# Patient Record
Sex: Female | Born: 1945 | Race: Black or African American | Hispanic: No | Smoking: Former smoker
Health system: Southern US, Community
[De-identification: ages and names within clinical notes are randomized; demographics above are authoritative.]

## PROBLEM LIST (undated history)

## (undated) DIAGNOSIS — I739 Peripheral vascular disease, unspecified: Secondary | ICD-10-CM

## (undated) DIAGNOSIS — Z87442 Personal history of urinary calculi: Secondary | ICD-10-CM

## (undated) DIAGNOSIS — I499 Cardiac arrhythmia, unspecified: Secondary | ICD-10-CM

## (undated) DIAGNOSIS — M1990 Unspecified osteoarthritis, unspecified site: Secondary | ICD-10-CM

## (undated) DIAGNOSIS — R0600 Dyspnea, unspecified: Secondary | ICD-10-CM

## (undated) DIAGNOSIS — M545 Low back pain, unspecified: Secondary | ICD-10-CM

## (undated) DIAGNOSIS — E669 Obesity, unspecified: Secondary | ICD-10-CM

## (undated) DIAGNOSIS — E119 Type 2 diabetes mellitus without complications: Secondary | ICD-10-CM

## (undated) DIAGNOSIS — I4892 Unspecified atrial flutter: Secondary | ICD-10-CM

## (undated) DIAGNOSIS — G8929 Other chronic pain: Secondary | ICD-10-CM

## (undated) DIAGNOSIS — R079 Chest pain, unspecified: Secondary | ICD-10-CM

## (undated) DIAGNOSIS — N281 Cyst of kidney, acquired: Secondary | ICD-10-CM

## (undated) DIAGNOSIS — Z8719 Personal history of other diseases of the digestive system: Secondary | ICD-10-CM

## (undated) DIAGNOSIS — I10 Essential (primary) hypertension: Secondary | ICD-10-CM

## (undated) DIAGNOSIS — I4891 Unspecified atrial fibrillation: Secondary | ICD-10-CM

## (undated) DIAGNOSIS — K219 Gastro-esophageal reflux disease without esophagitis: Secondary | ICD-10-CM

## (undated) HISTORY — DX: Obesity, unspecified: E66.9

## (undated) HISTORY — PX: BREAST EXCISIONAL BIOPSY: SUR124

## (undated) HISTORY — DX: Low back pain: M54.5

## (undated) HISTORY — DX: Chest pain, unspecified: R07.9

## (undated) HISTORY — PX: TONSILLECTOMY: SUR1361

## (undated) HISTORY — PX: FOOT SURGERY: SHX648

## (undated) HISTORY — DX: Low back pain, unspecified: M54.50

## (undated) HISTORY — DX: Gastro-esophageal reflux disease without esophagitis: K21.9

## (undated) HISTORY — DX: Other chronic pain: G89.29

---

## 1999-05-25 ENCOUNTER — Other Ambulatory Visit: Admission: RE | Admit: 1999-05-25 | Discharge: 1999-05-25 | Payer: Self-pay | Admitting: Emergency Medicine

## 1999-06-08 ENCOUNTER — Encounter: Payer: Self-pay | Admitting: Emergency Medicine

## 1999-06-08 ENCOUNTER — Encounter: Admission: RE | Admit: 1999-06-08 | Discharge: 1999-06-08 | Payer: Self-pay | Admitting: Emergency Medicine

## 1999-09-01 ENCOUNTER — Encounter: Payer: Self-pay | Admitting: Emergency Medicine

## 1999-09-01 ENCOUNTER — Encounter: Admission: RE | Admit: 1999-09-01 | Discharge: 1999-09-01 | Payer: Self-pay | Admitting: Emergency Medicine

## 1999-09-05 ENCOUNTER — Encounter: Admission: RE | Admit: 1999-09-05 | Discharge: 1999-09-05 | Payer: Self-pay | Admitting: Emergency Medicine

## 1999-09-05 ENCOUNTER — Encounter: Payer: Self-pay | Admitting: Emergency Medicine

## 2000-05-15 ENCOUNTER — Other Ambulatory Visit: Admission: RE | Admit: 2000-05-15 | Discharge: 2000-05-15 | Payer: Self-pay | Admitting: Emergency Medicine

## 2000-05-18 ENCOUNTER — Encounter: Admission: RE | Admit: 2000-05-18 | Discharge: 2000-05-18 | Payer: Self-pay | Admitting: Emergency Medicine

## 2000-05-18 ENCOUNTER — Encounter: Payer: Self-pay | Admitting: Emergency Medicine

## 2000-06-08 ENCOUNTER — Encounter: Payer: Self-pay | Admitting: Emergency Medicine

## 2000-06-08 ENCOUNTER — Encounter: Admission: RE | Admit: 2000-06-08 | Discharge: 2000-06-08 | Payer: Self-pay | Admitting: Emergency Medicine

## 2001-07-31 ENCOUNTER — Encounter: Payer: Self-pay | Admitting: Emergency Medicine

## 2001-07-31 ENCOUNTER — Encounter: Admission: RE | Admit: 2001-07-31 | Discharge: 2001-07-31 | Payer: Self-pay | Admitting: Emergency Medicine

## 2002-09-24 ENCOUNTER — Encounter: Payer: Self-pay | Admitting: Emergency Medicine

## 2002-09-24 ENCOUNTER — Encounter: Admission: RE | Admit: 2002-09-24 | Discharge: 2002-09-24 | Payer: Self-pay | Admitting: Emergency Medicine

## 2003-03-08 ENCOUNTER — Emergency Department (HOSPITAL_COMMUNITY): Admission: EM | Admit: 2003-03-08 | Discharge: 2003-03-08 | Payer: Self-pay | Admitting: Emergency Medicine

## 2003-04-13 ENCOUNTER — Encounter: Admission: RE | Admit: 2003-04-13 | Discharge: 2003-04-13 | Payer: Self-pay | Admitting: Emergency Medicine

## 2003-08-12 ENCOUNTER — Encounter: Admission: RE | Admit: 2003-08-12 | Discharge: 2003-08-12 | Payer: Self-pay | Admitting: Emergency Medicine

## 2003-09-10 ENCOUNTER — Encounter: Admission: RE | Admit: 2003-09-10 | Discharge: 2003-09-10 | Payer: Self-pay | Admitting: Emergency Medicine

## 2003-10-02 ENCOUNTER — Encounter: Admission: RE | Admit: 2003-10-02 | Discharge: 2003-10-02 | Payer: Self-pay | Admitting: Emergency Medicine

## 2003-10-30 ENCOUNTER — Encounter: Admission: RE | Admit: 2003-10-30 | Discharge: 2003-10-30 | Payer: Self-pay | Admitting: Emergency Medicine

## 2004-02-24 ENCOUNTER — Encounter
Admission: RE | Admit: 2004-02-24 | Discharge: 2004-05-24 | Payer: Self-pay | Admitting: Physical Medicine & Rehabilitation

## 2004-02-26 ENCOUNTER — Ambulatory Visit: Payer: Self-pay | Admitting: Physical Medicine & Rehabilitation

## 2004-02-27 ENCOUNTER — Ambulatory Visit (HOSPITAL_COMMUNITY)
Admission: RE | Admit: 2004-02-27 | Discharge: 2004-02-27 | Payer: Self-pay | Admitting: Physical Medicine & Rehabilitation

## 2004-02-27 ENCOUNTER — Encounter: Admission: RE | Admit: 2004-02-27 | Discharge: 2004-02-27 | Payer: Self-pay | Admitting: Emergency Medicine

## 2004-02-29 ENCOUNTER — Ambulatory Visit: Payer: Self-pay | Admitting: Physical Medicine & Rehabilitation

## 2005-10-15 ENCOUNTER — Emergency Department (HOSPITAL_COMMUNITY): Admission: EM | Admit: 2005-10-15 | Discharge: 2005-10-16 | Payer: Self-pay | Admitting: Emergency Medicine

## 2005-10-25 ENCOUNTER — Ambulatory Visit: Payer: Self-pay | Admitting: Nurse Practitioner

## 2005-10-30 ENCOUNTER — Ambulatory Visit (HOSPITAL_COMMUNITY): Admission: RE | Admit: 2005-10-30 | Discharge: 2005-10-30 | Payer: Self-pay | Admitting: Family Medicine

## 2005-12-06 ENCOUNTER — Ambulatory Visit (HOSPITAL_COMMUNITY): Admission: RE | Admit: 2005-12-06 | Discharge: 2005-12-06 | Payer: Self-pay | Admitting: Urology

## 2005-12-13 ENCOUNTER — Ambulatory Visit: Payer: Self-pay | Admitting: Nurse Practitioner

## 2005-12-14 ENCOUNTER — Ambulatory Visit: Payer: Self-pay | Admitting: *Deleted

## 2006-03-26 ENCOUNTER — Ambulatory Visit: Payer: Self-pay | Admitting: Nurse Practitioner

## 2006-04-09 ENCOUNTER — Encounter: Admission: RE | Admit: 2006-04-09 | Discharge: 2006-04-09 | Payer: Self-pay | Admitting: Family Medicine

## 2006-04-17 ENCOUNTER — Ambulatory Visit: Payer: Self-pay | Admitting: Nurse Practitioner

## 2006-04-18 ENCOUNTER — Ambulatory Visit: Payer: Self-pay | Admitting: Nurse Practitioner

## 2006-04-20 ENCOUNTER — Ambulatory Visit (HOSPITAL_COMMUNITY): Admission: RE | Admit: 2006-04-20 | Discharge: 2006-04-20 | Payer: Self-pay | Admitting: Family Medicine

## 2006-06-06 ENCOUNTER — Ambulatory Visit: Payer: Self-pay | Admitting: Nurse Practitioner

## 2006-10-24 ENCOUNTER — Encounter (INDEPENDENT_AMBULATORY_CARE_PROVIDER_SITE_OTHER): Payer: Self-pay | Admitting: *Deleted

## 2006-10-26 ENCOUNTER — Ambulatory Visit: Payer: Self-pay | Admitting: Physical Medicine & Rehabilitation

## 2006-10-26 ENCOUNTER — Encounter
Admission: RE | Admit: 2006-10-26 | Discharge: 2007-01-24 | Payer: Self-pay | Admitting: Physical Medicine & Rehabilitation

## 2006-11-04 ENCOUNTER — Encounter
Admission: RE | Admit: 2006-11-04 | Discharge: 2006-11-04 | Payer: Self-pay | Admitting: Physical Medicine & Rehabilitation

## 2006-11-26 ENCOUNTER — Ambulatory Visit: Payer: Self-pay | Admitting: Physical Medicine & Rehabilitation

## 2006-12-03 ENCOUNTER — Encounter
Admission: RE | Admit: 2006-12-03 | Discharge: 2006-12-19 | Payer: Self-pay | Admitting: Physical Medicine & Rehabilitation

## 2007-01-14 ENCOUNTER — Ambulatory Visit: Payer: Self-pay | Admitting: Physical Medicine & Rehabilitation

## 2007-02-04 ENCOUNTER — Encounter
Admission: RE | Admit: 2007-02-04 | Discharge: 2007-04-18 | Payer: Self-pay | Admitting: Physical Medicine & Rehabilitation

## 2007-02-14 ENCOUNTER — Encounter
Admission: RE | Admit: 2007-02-14 | Discharge: 2007-04-02 | Payer: Self-pay | Admitting: Physical Medicine & Rehabilitation

## 2007-04-01 ENCOUNTER — Ambulatory Visit: Payer: Self-pay | Admitting: Physical Medicine & Rehabilitation

## 2007-04-01 ENCOUNTER — Encounter
Admission: RE | Admit: 2007-04-01 | Discharge: 2007-06-30 | Payer: Self-pay | Admitting: Physical Medicine & Rehabilitation

## 2007-05-28 ENCOUNTER — Ambulatory Visit: Payer: Self-pay | Admitting: Physical Medicine & Rehabilitation

## 2007-05-28 ENCOUNTER — Encounter: Admission: RE | Admit: 2007-05-28 | Discharge: 2007-05-28 | Payer: Self-pay | Admitting: Emergency Medicine

## 2007-06-13 ENCOUNTER — Ambulatory Visit: Payer: Self-pay | Admitting: Physical Medicine & Rehabilitation

## 2008-02-20 DIAGNOSIS — M5137 Other intervertebral disc degeneration, lumbosacral region: Secondary | ICD-10-CM | POA: Insufficient documentation

## 2008-08-14 DIAGNOSIS — J45909 Unspecified asthma, uncomplicated: Secondary | ICD-10-CM | POA: Insufficient documentation

## 2008-08-14 DIAGNOSIS — K219 Gastro-esophageal reflux disease without esophagitis: Secondary | ICD-10-CM | POA: Insufficient documentation

## 2008-09-18 DIAGNOSIS — N390 Urinary tract infection, site not specified: Secondary | ICD-10-CM | POA: Insufficient documentation

## 2008-10-07 DIAGNOSIS — R21 Rash and other nonspecific skin eruption: Secondary | ICD-10-CM | POA: Insufficient documentation

## 2008-10-07 DIAGNOSIS — M129 Arthropathy, unspecified: Secondary | ICD-10-CM | POA: Insufficient documentation

## 2008-11-12 DIAGNOSIS — E559 Vitamin D deficiency, unspecified: Secondary | ICD-10-CM | POA: Insufficient documentation

## 2008-11-12 DIAGNOSIS — D649 Anemia, unspecified: Secondary | ICD-10-CM | POA: Insufficient documentation

## 2008-11-27 ENCOUNTER — Encounter: Admission: RE | Admit: 2008-11-27 | Discharge: 2008-11-27 | Payer: Self-pay | Admitting: Family Medicine

## 2008-12-22 DIAGNOSIS — E785 Hyperlipidemia, unspecified: Secondary | ICD-10-CM | POA: Diagnosis present

## 2008-12-22 DIAGNOSIS — E119 Type 2 diabetes mellitus without complications: Secondary | ICD-10-CM | POA: Diagnosis present

## 2008-12-22 DIAGNOSIS — IMO0001 Reserved for inherently not codable concepts without codable children: Secondary | ICD-10-CM | POA: Diagnosis present

## 2009-03-18 ENCOUNTER — Encounter
Admission: RE | Admit: 2009-03-18 | Discharge: 2009-05-19 | Payer: Self-pay | Admitting: Physical Medicine & Rehabilitation

## 2009-03-19 DIAGNOSIS — R87625 Unsatisfactory cytologic smear of vagina: Secondary | ICD-10-CM | POA: Insufficient documentation

## 2009-03-19 DIAGNOSIS — G4700 Insomnia, unspecified: Secondary | ICD-10-CM | POA: Insufficient documentation

## 2009-03-19 DIAGNOSIS — F339 Major depressive disorder, recurrent, unspecified: Secondary | ICD-10-CM | POA: Insufficient documentation

## 2009-03-23 ENCOUNTER — Ambulatory Visit: Payer: Self-pay | Admitting: Physical Medicine & Rehabilitation

## 2009-03-30 ENCOUNTER — Ambulatory Visit (HOSPITAL_COMMUNITY)
Admission: RE | Admit: 2009-03-30 | Discharge: 2009-03-30 | Payer: Self-pay | Admitting: Physical Medicine & Rehabilitation

## 2009-06-16 ENCOUNTER — Encounter
Admission: RE | Admit: 2009-06-16 | Discharge: 2009-06-16 | Payer: Self-pay | Admitting: Physical Medicine & Rehabilitation

## 2010-02-27 ENCOUNTER — Encounter: Payer: Self-pay | Admitting: Emergency Medicine

## 2010-03-08 NOTE — Miscellaneous (Signed)
Summary: VIP  Patient: Brittany Ford Note: All result statuses are Final unless otherwise noted.  Tests: (1) VIP (Medications)   LLIMPORTMEDS              "Result Below..."       RESULT: PREDNISONE TABS 5 MG*6 TABLETS ON DAY 1, 5 TABLETS ON DAY 2 4 TABS ON DAY 3,  3 TABS ON DAY 4, 2 TABLETS ON DAY 5, 1 TABLET ON DAY 6*06/07/2006*Last Refill: [id number]*******   LLIMPORTMEDS              "Result Below..."       RESULT: NEXIUM CPDR 40 MG*TAKE ONE (1) CAPSULE EACH DAY*06/07/2006*Last  Refill: Unknown*[id number]*******   LLIMPORTMEDS              "Result Below..."       RESULT: MUCINEX DM TB12 30-600 MG*TAKE ONE (1) TABLET TWICE DAILY*06/07/2006*Last Refill: [id number]*[id number]*******   LLIMPORTMEDS              "Result Below..."       RESULT: CYCLOBENZAPRINE HCL TABS 10 MG*TAKE ONE TABLET BY MOUTH EVERY NIGHT AT BEDTIME*12/13/2005*Last Refill: IONGEXB*[id number]*******   LLIMPORTMEDS              "Result Below..."       RESULT: CELEBREX CAPS 200 MG*TAKE ONE (1) CAPSULE EACH DAY*03/26/2006*Last Refill: 06/07/2006*[id number]*******   LLIMPORTMEDS              "Result Below..."       RESULT: AVELOX TABS 400 MG*TAKE ONE (1) TABLET EACH DAY FOR 10 DAYS*06/07/2006*Last Refill: Unknown*[id number]*******   LLIMPORTALLS              NKDA***  Note: An exclamation mark (!) indicates a result that was not dispersed into the flowsheet. Document Creation Date: 12/06/2006 3:07 PM _______________________________________________________________________  (1) Order result status: Final Collection or observation date-time: 10/24/2006 Requested date-time: 10/24/2006 Receipt date-time:  Reported date-time: 10/24/2006 Referring Physician:   Ordering Physician:   Specimen Source:  Source: [facility] [place] Order Number:  Lab site:

## 2010-04-28 ENCOUNTER — Encounter: Payer: Self-pay | Admitting: Internal Medicine

## 2010-05-05 NOTE — Letter (Signed)
Summary: Colonoscopy Date Change Letter  [facility]  [address] [place], [place] [address]   Phone: [phone]  Fax: [phone]      April 28, 2010 MRN: [id number]   Brittany Ford [address] [place], [place]  [address]   Dear [name],   Previously you were recommended to have a repeat colonoscopy around this time. Your chart was recently reviewed by Dr. Lina Sar of [facility]. Follow up colonoscopy is now recommended in March 2015. This revised recommendation is based on current, nationally recognized guidelines for colorectal cancer screening and polyp surveillance. These guidelines are endorsed by the American Cancer Society, [employer] on Colorectal Cancer as well as numerous other major medical organizations.  Please understand that our recommendation assumes that you do not have any new symptoms such as bleeding, a change in bowel habits, anemia, or significant abdominal discomfort. If you do have any concerning GI symptoms or want to discuss the guideline recommendations, please call to arrange an office visit at your earliest convenience. Otherwise we will keep you in our reminder system and contact you 1-2 months prior to the date listed above to schedule your next colonoscopy.  Thank you,  Hedwig Morton. Juanda Chance, M.D.  [facility] [phone]

## 2010-06-21 NOTE — Assessment & Plan Note (Signed)
Brittany Ford states that she had some relief in the neck with the trigger  point injections, but it only lasted for a few days.  She seems to have  worse pain in the left shoulder with tingling in the left arm and hands.  I reviewed her cervical MRI which is unremarkable for any obvious nerve  root impingement.  She rates her pain at 9/10 today.  She felt the  hydrocodone helped somewhat with breakthrough pain, but she has been out  of these.  She was unable to tolerate the Neurontin due to nausea.  She  states the pain is sharp, burning,  constant, tingling, aching.  Pain  interferes general activity, relationship with others, and enjoyment of  life on a moderate to severe level.  Sleep is fair to poor.   REVIEW OF SYSTEMS:  Notable for trouble walking, spasm, tingling.  Other  pertinent positives as listed above, and full review is listed in Health  and History section of the chart.   SOCIAL HISTORY:  Without change.  She is divorced and still smoking.   PHYSICAL EXAMINATION:  VITAL SIGNS: Blood pressure 146/91, pulse 78,  respiratory rate 18.  She is saturating 100% on room air.  GENERAL:  The patient is alert and oriented x3.  Affect is bright and  appropriate.  Gait is slightly antalgic, favoring the right leg.  MUSCULOSKELETAL:  She has limited shoulder use today and had positive  impingement signs.  Bicipital tendons are slightly tender.  She had  trigger points along the middle and upper trapezius on the left.  Left  arm was notable for positive Tinel's sign at the ulnar groove and plus  or minus at the carpal tunnel.  She had intact strength and reflexes at  all upper extremity joints and muscles.  Strength was generally 4+ to  5/5 with some pain inhibition, particularly at the shoulder.  Low back  was limited with movement.  She had a positive straight leg raise on the  right side.  Strength was still inhibited by pain in the right hip and  knee with examination today.  No focal  sensory findings were appreciated  in the right leg or left hand.  HEART:  Regular.  CHEST:  Clear.  ABDOMEN:  Soft, nontender.  BACK:  Low back notable for pain with flexion and somewhat with  extension today.  Facet maneuvers were positive.   ASSESSMENT:  1. 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  2. Left shoulder pain which is more indicative of rotator cuff      syndrome today.  3. Left upper extremity paresthesias and sensory loss.  Need to rule      out cervical radiculopathy versus a peripheral process.  4. Questionable right L4 radiculitis.  I did obtain reports of      injections done at [facility] in 2005.  The first was a right L4 nerve      root injection.  The second was a right L4-L5 intralaminar      injection, and the third was a caudal injection,  none of which      proved to be of any benefit to the patient.  Wanting to think about      other options as opposed to repeating such a course again.  5. Begin Topamax 50 mg nightly for neuropathic pain, increasing to 100      mg after one week's time.  6.  I gave her oxycodone for breakthrough pain control, 1 q. 6 h      p.r.n., #70.  7. I will see her back in about one month's time.      Ranelle Oyster, M.D.  Electronically Signed     ZTS/MedQ  D:  01/15/2007 13:43:05  T:  01/15/2007 17:36:26  Job #:  [id number]   cc:   Reuben Likes, M.D.  Fax: [phone]

## 2010-06-21 NOTE — Assessment & Plan Note (Signed)
Diane is back regarding her pain.  Her low back seems to be giving her  the most pain still at this point with pain most prominent in the back  with some radiation into her legs and knees.  She has had some issues  with her overall health including some high sugar readings and  suggestion of close followup by her family physician.  She is interested  in doing more exercise, but those have been limited due to her back  pain.  She uses her Mobic as well as Topamax.  She is on oxycodone for  breakthrough pain and occasional will double up on this for days when  she is more active.  She describes her pain most problematic when she is  walking or standing.  She also has pain with bending and certainly has  problems with standing after she has been seated for some time.  She  rates her pain at 5-6 out of 10, described as constant and aching.  Pain  interferes with general activity, relations with others, and enjoyment  of life moderate to severe level.   REVIEW OF SYSTEMS:  Notable for the above as well as some depression and  anxiety.  Full reviews are written on the  history section of the chart.   SOCIAL HISTORY:  The patient lives alone and is divorced.  She is  looking at starting some water aerobics classes.   PHYSICAL EXAMINATION:  VITAL SIGNS:  Blood pressure is 120/72, pulse 72,  respiratory rate 20.  She is satting 98% on room air.  GENERAL:  The patient is pleasant, alert, and oriented x3.  She had some  difficulty with standing from a seated position today.  She was able to  flex the lumbar spine to approximately 60-70 degrees before tightness  set in.  She had pain with extension in facet maneuvers to either side.  She was tender in her low back particularly at L4-5 and S1 levels.  Strength was 5/5.  Reflexes 2+.  Sensory exam is normal.  Straight leg  raise test wasequivocal to positive bilaterally.  HEART:  Regular.  CHEST:  Clear.  ABDOMEN:  Soft and nontender.   ASSESSMENT:  1. Multilevel cervical and lumbar facet disease.  2. 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  3. Left shoulder pain consistent with rotator cuff signs in the past.  4. Lumbar radiculopathy perhaps at the L4-L5 levels right greater than      left.   PLAN:  1. As symptoms appear to be more axial than radiating and the fact      that her exam and MR are most consistent with facet signs, I would      favor starting her out with medial branch blocks here and see how      she does.  We have done bilateral SI joint injections in the past      with modest benefit.  I described the procedure to the patient, and      she is in agreement with going forward.  I will refer to Dr.      Wynn Banker for bilateral medial branch blocks at L4-L5 and L5-S1.      Also consider interlaminar injection at L4-L5 level as well      depending on the results.  2. Refill oxycodone #70 today 1 q.6 h. p.r.n.  3. Continue Mobic and Topamax.  4. I will see her back pending the above.  The hope would  be to reduce      her pain such that she could be in a regular aerobic exercise and      stretching program.      Ranelle Oyster, M.D.  Electronically Signed     ZTS/MedQ  D:  05/28/2007 10:30:52  T:  05/28/2007 11:08:31  Job #:  [id number]   cc:   Reuben Likes, M.D.  Fax: [phone]

## 2010-06-21 NOTE — Assessment & Plan Note (Signed)
Brittany Ford is back regarding her pain.  Neck has been doing fairly well over  the last month or so.  Her back continues to be a problem.  She ran out  of her Topamax, and did not realize it had a refill on it, and has gone  without.  She felt that the Topamax was beneficial for her leg symptoms,  particularly on the right side.  She is no longer taking her Mobic  either for similar reason.  She ran out of her oxycodone, and was not in  a rush to refill it apparently.  She rates her pain a 5 out of 10.  She  describes it as sharp, burning, stabbing, constant aching.  The pain  interferes with general activity, relations with others, and enjoyment  of life on a moderate to severe level.  Sleep is poor at times.  I  reviewed her injection history, and she had a right L4/L5 intralaminar  injection, as well as a caudal block and right L4 nerve root injection.  Patient describes her symptoms as starting in her back and radiating  down the lateral aspect of the thigh and leg in to the dorsum of the  foot.  The pain is sometimes tingling and dysesthetic in nature.   REVIEW OF SYSTEMS:  Noted for the above.  She is working on diet.  She  is not getting a lot of physical activity, however, due to her pain  symptoms.  She does complain of reflux.   SOCIAL HISTORY:  Patient is alone and divorced.  No other changes are  noted.   PHYSICAL EXAMINATION:  Blood pressure is 175/78.  Pulse 67.  Respiratory  rate 20.  She is saturating 97% on room air.  Patient is pleasant, alert and oriented x3.  She walks with some  antalgia at the right side.  She has a positive straight leg raise and  seated __________  test on the right side today.  Back is painful along  the lumbar paraspinals, right more than left.  It is L3 through S1.  Strength essentially 5/5 in all 4 extremities with some pain inhibition  in the right hip.  Reflexes are 2+.  Sensory exam is normal.  HEART:  Regular.  CHEST:  Clear.  ABDOMEN:   Soft and nontender.  Patient remains moderately overweight.   ASSESSMENT:  1. Multilevel cervical and lumbar facet disease with degenerative disk      disease.  2. Left shoulder pain with rotator cuff signs.  3. Persistent low back pain and likely L5 radiculitis.  She has been      treated for L4 symptoms in the past without benefit.  The symptoms      are more in line with the L5 process today.   PLAN:  1. We will restart Mobic 15 mg daily.  2. Resume Topamax 100 mg nightly.  She felt relief with this      previously.  3. I refilled oxycodone number 30 today.  4. Consider further injections down the line, including L5 nerve root      block versus a right-sided intralaminar injection in L5/S1.  5. I will see her back in 2 months' time.  Patient would prefer a      conservative course at this point.  Encouraged diet and appropriate      exercise in the meantime as well.      Ranelle Oyster, M.D.  Electronically Signed     ZTS/MedQ  D:  04/02/2007 09:47:24  T:  04/02/2007 11:27:39  Job #:  [id number]   cc:   Reuben Likes, M.D.  Fax: [phone]

## 2010-06-21 NOTE — Assessment & Plan Note (Signed)
REASON FOR VISIT:  [name] is back regarding her neck and back pain.  She complains of more shoulder and arm pain today over the last week to  week and a half.  I received her MRI of the cervical lumbar spine which  showed cervical stenosis at C5-6 unchanged from prior exam.  She had  multiple areas of osteophytes throughout multiple levels.  The lumbar  spine was notable for multiple levels of facet disease, particularly  notable at L4-5 with bulging in conjunction with the facet disease  causing narrowing of the right L4 foramen.  L5 showed similar findings,  but to a lesser extent.  The patient's back pain has been generally  stable.  She still has complaints of the right leg being uncomfortable  and burning in nature.  She did not notice any significant benefit with  the Lidoderm, steroid or Mobic, although she seems to be moving in  general a little bit better today.  She got the generic Vicodin and  does not know if this helps as much.  Sleep is poor.  The patient rates  her pain an 8-9/10 today.   REVIEW OF SYSTEMS:  The patient denies bladder control problems or any  other neurological issues.  Full review is in the written health and  history section of the chart.   SOCIAL HISTORY:  The patient lives alone and still smokes on a limited  basis.   PHYSICAL EXAMINATION:  VITAL SIGNS:  Blood pressure 152/79, pulse 73,  respirations 18, saturations 100% on room air.  GENERAL:  The patient is pleasant, alert and oriented x3.  Affect is  bright and appropriate.  MUSCULOSKELETAL:  The patient did have pain in the left shoulder with  palpation of the short-head biceps tendon today.  She also had taught  bands of muscle along the left Trapezius at middle and superior thirds.  Palpation upon the Trapezius in these areas reproduced pain in her neck.  The back was notable for some pain still in the lumbar spine increased  with straight leg raising as well as excessive bending to  extension.  Facet maneuvers were positive there.  Reflexes were trace in all six  upper extremity locations and the same in the lower extremity testing  spots.  Strength appeared to be equal at 4+ to 5/5 in all four  extremities with some pain inhibition at times.  Cognitively, the  patient was appropriate.  HEART:  Regular.  CHEST:  Clear.  ABDOMEN:  Soft, nontender.  NEUROLOGIC:  Mood was pleasant.   ASSESSMENT:  1. Multi-level cervical and lumbar facet disease and degenerative disc      disease.  2. Left shoulder pain most likely due to short-head biceps tendinitis.  3. Likely shoulder girdle myofascial pain on the left.  4. Questionable right L4 radiculitis related to #1.   PLAN:  1. After informed consent, we injected two trigger points in the      Trapezius using 2 mL each of 1% lidocaine.  I also injected the      left short-head biceps tendon with 40 mg of Kenalog and 3 mL of 1%      lidocaine.  2. Will send the patient for physical therapy to work on lumbar and      cervical positioning, stretching range of motion, strengthening,      etc.  Modalities will be used as indicated.  She may do well with      traction in the  cervical spine.  She needs to be on a regular      exercise program as well.  3. Consider right L4 nerve root injection in the future.  I think she      may have had this in the past, so we will hold off on it at the      moment.  4. Begin Neurontin low-dose 100 mg nightly and then increasing to      b.i.d. over 1 week's time to reduce radicular      symptoms.  5. I will see her back in about 1 month for followup as we move      forward.      Ranelle Oyster, M.D.  Electronically Signed     ZTS/MedQ  D:  11/26/2006 13:25:30  T:  11/27/2006 09:43:46  Job #:  [id number]   cc:   Reuben Likes, M.D.  Fax: [phone]

## 2010-06-21 NOTE — Procedures (Signed)
NAME:  Brittany Ford, Brittany Ford               ACCOUNT NO.:  [id number]   MEDICAL RECORD NO.:  [id number]          PATIENT TYPE:  REC   LOCATION:  [facility]                          FACILITY:  [facility]   PHYSICIAN:  Erick Colace, M.D.DATE OF BIRTH:  11-Jan-1946   DATE OF PROCEDURE:  06/13/2007  DATE OF DISCHARGE:                               OPERATIVE REPORT   PROCEDURE:  Bilateral L5 dorsal ramus injection, bilateral L4 medial  branch block, bilateral L3 medial branch block under fluoroscopic  guidance at both sacral and lumbar levels.   INDICATIONS:  Lumbar spondylosis without myelopathy.  Pain is only  partial responsive to medication management including narcotic  analgesics and interferes with self-care mobility.   Informed consent was obtained after describing risks and benefits of the  procedure to the patient.  These include bleeding, bruising, infection,  temporary or permanent paralysis.  She elects to proceed and has given  written consent.  The patient placed prone on fluoroscopy table.  Betadine prep, sterile drape, 25-gauge inch and a half needle was used  to anesthetize the skin and subcutaneous tissue 1% lidocaine x2 mL, then  a 22-gauge three and a half inch spinal needle was inserted under  fluoroscopic guidance first targeting the left S1 SAP sacral ala  junction.  Bone contact made, confirmed with lateral imaging.  Omnipaque  180 x 0.5 mL demonstrated no intravascular uptake and 0.5 mL of a  solution containing 1 mL of 4 mg/mL  dexamethasone and 2 mL of 2% MPF  lidocaine were injected after negative drawback for blood.  Then the  left L5 SAP transverse process junction targeted.  Bone contact made,  confirmed with lateral imaging.  Omnipaque 180 x0.5 mL demonstrated no  intravascular uptake, then 0.5 mL of dexamethasone lidocaine solution  was injected.  Then the left L4 SAP transverse junction targeted.  Bone  contact made, confirmed with lateral imaging.  Omnipaque 180  x0.5  demonstrated no intravascular uptake, then 0.5 mL of dexamethasone  lidocaine solution was injected.  The patient tolerated procedure well.  Post injection vitals stable.  Post injection instructions given.  Pre-  injection pain level 5/10, post injection 1/10.  I will see her back in  one month for possible reinjection at that time.      Erick Colace, M.D.  Electronically Signed     AEK/MEDQ  D:  06/13/2007 15:02:03  T:  06/13/2007 15:23:33  Job:  [id number]

## 2010-06-24 NOTE — Group Therapy Note (Signed)
MEDICAL RECORD NUMBER:  [id number].   REFERRING PHYSICIAN:  Dr. Leslee Home.   CHIEF COMPLAINT:  Neck and predominantly low back pain.   The patient's goals are to become pain free.   HISTORY OF PRESENT ILLNESS:  This is a 65 year old African-American female  who reports low back pain since around the year 2000. The pain has been  treated off and on with therapy with different modalities. She has been on  other types of pain medications, anti-inflammatories, and muscle relaxants  over the years. She feels that pain seems to increase over the last year's  time. The patient has affected her job performance as well as mood. She has  become depressed. The patient had a MRI performed of the lumbar spine in  July 2005 which was compared to a study done in 2001. The MRI in 2005  revealed a mild concentric disk bulge at L2, slightly progressed from the  prior study. There was also concentric disk bulge at L3-L4 without central  canal stenosis with mild biforaminal narrowing predominantly due to disk  osteophytes and concentric disk bulge which has progressed mildly from the  prior study. At L4-L5, there was a moderate concentric disk bulge presenting  in mild central stenosis with biforaminal narrowing, right greater than  left, which had progressed also. There may have been some right L4-L5 nerve  root impingement also. Moderate facet arthropathy at L4-L5 had not changed.  The patient ultimately as a result of the testing and her symptoms underwent  a series of injection. The first two essentially were transforaminal  injections from the right side at L4-L5 using Depo-Medrol and lidocaine. The  patient felt no relief with these. These were done in August of 2005. In  September of 2005, she had a caudal epidural done with 120 mg of Decadrol  and 3 cc of lidocaine without any results either. Most recently, the patient  was placed on Aleve without any significant benefit as of yet. She is  taking  4 to 5 a day. She is also using Flexeril for spasms p.r.n. She was given a  new prescription for hydrocodone as well. She has used this before for  breakthrough pain also.   The patient reports her pain is an 8/10 on average and ranges from to 7 to  10. It usually worsens with walking, bending, and working and improves  somewhat with therapies and medications. Additionally, the patient reports  numbness in the feet which is relatively new. She also notes periodic  numbness in the hand. The right knee is painful, and she thinks she may have  arthritis in the hand. The low back pain itself seems to stay predominately  in the low back and over the buttock areas. She does have some referral down  the anterolateral legs, but this is inconsistent and is thought that she is  not sure on the right side that this is not in fact her knee pain.   CURRENT MEDICATIONS:  1.  Effexor 225 mg XR q.d.  2.  Flexeril 5 mg q.d. p.r.n.  3.  Aleve 4 to 5 tablets a day.  4.  Midrin p.r.n. q.d. for migraine headaches.   ALLERGIES:  No known drug allergies.   PAST MEDICAL HISTORY:  1.  The patient denies history other than depression and her back pain      problems.  2.  Migraine headaches.   PAST SURGICAL HISTORY:  She has had a left foot surgery in 2004.  SOCIAL HISTORY:  The patient lives alone. Smokes a half a pack per day. She  is divorced. 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 She  does a lot of work on [employer] and computers. She is also up and down a  lot through the day from her desk. She has been in this line of work for  about 7 years. She has been out of work for about a week due to pain. She  works for [employer].   FAMILY HISTORY:  Significant for cancer.   REVIEW OF SYSTEMS:  The patient reports occasional chest pain, swelling,  cold or flu symptoms, depression, problems with sleep, headaches, spasms,  reflux, decreased appetite, bowel incontinence, and  weight loss. Other  review of systems are in the health and history section of our chart.   PHYSICAL EXAMINATION:  GENERAL:  Blood pressure is 145/79, pulse 73,  respiratory rate 16. She is saturating 98% on room air. The patient walks  with a shuffling gait. Affect is bright and alert. Appearance is well kept.  HEENT:  On examination of the head and neck, she is normocephalic,  atraumatic. Pupils are equal, round, and reactive to light.  HEART:  Regular rate and rhythm.  LUNGS:  Clear.  ABDOMEN:  Soft and nontender.  EXTREMITIES:  Showed no clubbing or cyanosis. She did have trace edema in  both ankles of pitting variety. Neck range of motion was a bit limited  today, particularly with forward flexion. We did not address the neck in  depth today due to her multiple other issues. Low back was limited with  movement. She is able to move about 30 degrees with forward flexion, about  10 degrees with extension, both of which elicited pain. Rotation and lateral  bending to each side were about 15 degrees, all with pain. She had  tenderness along both PSIS areas and generalized discomfort with palpation  along the lower lumbar region into the upper gluteal areas bilaterally.  Piriformis testing was negative to equivocal. Greater trochanters were  nontender. Pelvis was symmetrical, and lumbar spine and thoracic spine were  fairly symmetrical today. No significant rotation was noted in the AP plane.  On provocative maneuvers, Patrick's test was positive bilaterally, right  more so than left. Straight leg raise testing was equivocal to positive.  Facet maneuvers were equivocal. Motors testing was 5/5 really in both upper  and lower extremities with a small amount of pain inhibition in the proximal  bilateral lower extremities. He seemed to have decreased pinprick in a  stocking-glove type distribution in both legs. Right hand was negative for sensory loss. She had equivocal Phalen's test today.  Cognition was  appropriate. Cranial nerve exam was intact.   ASSESSMENT:  1.  Chronic low back pain which is likely multifactorial. I would question      significant SI joint component based on her exam history. Cannot rule      out a facet cause also. I do not feel that radiculopathy or stenosis is      a major factor to her pain at this point.  2.  Bilateral distal sensory loss. Noted a lab from last year with elevated      glucose. I would be concerned here for diabetes. I would like to perform      a screening hemoglobin A1c test today to see that if her sugars have in      fact been elevated over time.  May consider nerve conduction studies      along this line as well to also include the right upper extremity. The      right upper extremity could in fact be a mild carpal tunnel syndrome.  3.  Await results of her cervical MRI.  4.  Give the patient samples of Lidoderm patches, and she will have the      prescription patches, too, as well once her samples run out. She is to      apply 1 to 2 patches per day to the low back region.  5.  Advised her to take Aleve maximum 2 tablets twice a day rather than 4 to      5 times a day. The patient understands.  6.  For breakthrough pain, will use oxycodone 5 mg 1 to 2 q.6h. p.r.n.  7.  Send the patient for bilateral SI joint injections with Dr. Wynn Banker.      Consider facet injections.  8.  Refill the patient's Flexeril today 5 mg t.i.d. p.r.n.  9.  Will see the patient back after her SI joint injections.     Zach   ZTS/MedQ  D:  02/26/2004 13:16:02  T:  02/26/2004 14:56:02  Job #:  [id number]   cc:   Reuben Likes, M.D.  [address]  [place]  [place] [address]  Fax: [phone]

## 2010-08-13 ENCOUNTER — Emergency Department (HOSPITAL_COMMUNITY): Payer: Medicare Other

## 2010-08-13 ENCOUNTER — Inpatient Hospital Stay (HOSPITAL_COMMUNITY)
Admission: EM | Admit: 2010-08-13 | Discharge: 2010-08-15 | DRG: 392 | Disposition: A | Discharge status: Home / Self Care | Payer: Medicare Other | Attending: Family Medicine | Admitting: Family Medicine

## 2010-08-13 DIAGNOSIS — Z7982 Long term (current) use of aspirin: Secondary | ICD-10-CM

## 2010-08-13 DIAGNOSIS — Z87891 Personal history of nicotine dependence: Secondary | ICD-10-CM

## 2010-08-13 DIAGNOSIS — M1990 Unspecified osteoarthritis, unspecified site: Secondary | ICD-10-CM | POA: Diagnosis present

## 2010-08-13 DIAGNOSIS — E860 Dehydration: Secondary | ICD-10-CM | POA: Diagnosis present

## 2010-08-13 DIAGNOSIS — Z79899 Other long term (current) drug therapy: Secondary | ICD-10-CM

## 2010-08-13 DIAGNOSIS — K529 Noninfective gastroenteritis and colitis, unspecified: Secondary | ICD-10-CM

## 2010-08-13 DIAGNOSIS — A088 Other specified intestinal infections: Principal | ICD-10-CM | POA: Diagnosis present

## 2010-08-13 DIAGNOSIS — R55 Syncope and collapse: Secondary | ICD-10-CM

## 2010-08-13 DIAGNOSIS — I10 Essential (primary) hypertension: Secondary | ICD-10-CM | POA: Diagnosis present

## 2010-08-13 DIAGNOSIS — J40 Bronchitis, not specified as acute or chronic: Secondary | ICD-10-CM | POA: Diagnosis present

## 2010-08-13 DIAGNOSIS — J329 Chronic sinusitis, unspecified: Secondary | ICD-10-CM | POA: Diagnosis present

## 2010-08-13 DIAGNOSIS — Z23 Encounter for immunization: Secondary | ICD-10-CM

## 2010-08-13 LAB — COMPREHENSIVE METABOLIC PANEL
ALT: 15 U/L (ref 0–35)
AST: 16 U/L (ref 0–37)
Albumin: 3.9 g/dL (ref 3.5–5.2)
Alkaline Phosphatase: 111 U/L (ref 39–117)
BUN: 10 mg/dL (ref 6–23)
CO2: 24 mEq/L (ref 19–32)
Calcium: 9.6 mg/dL (ref 8.4–10.5)
Chloride: 104 mEq/L (ref 96–112)
Creatinine, Ser: 0.82 mg/dL (ref 0.50–1.10)
GFR calc Af Amer: >60 mL/min (ref >60)
GFR calc non Af Amer: >60 mL/min (ref >60)
Glucose, Bld: 178 mg/dL — ABNORMAL HIGH (ref 70–99)
Potassium: 4.5 mEq/L (ref 3.5–5.1)
Sodium: 138 mEq/L (ref 135–145)
Total Bilirubin: 0.3 mg/dL (ref 0.3–1.2)
Total Protein: 8.1 g/dL (ref 6.0–8.3)

## 2010-08-13 LAB — CBC
HCT: 39.1 % (ref 36.0–46.0)
Hemoglobin: 13.3 g/dL (ref 12.0–15.0)
MCH: 27.9 pg (ref 26.0–34.0)
MCHC: 34 g/dL (ref 30.0–36.0)
MCV: 82.1 fL (ref 78.0–100.0)
Platelets: 302 10*3/uL (ref 150–400)
RBC: 4.76 MIL/uL (ref 3.87–5.11)
RDW: 14.5 % (ref 11.5–15.5)
WBC: 18.3 10*3/uL — ABNORMAL HIGH (ref 4.0–10.5)

## 2010-08-13 LAB — URINALYSIS, ROUTINE W REFLEX MICROSCOPIC
Glucose, UA: NEGATIVE mg/dL
Ketones, ur: 15 mg/dL — AB
Leukocytes, UA: NEGATIVE
Nitrite: NEGATIVE
Protein, ur: 100 mg/dL — AB
Specific Gravity, Urine: 1.026 (ref 1.005–1.030)
Urobilinogen, UA: 0.2 mg/dL (ref 0.0–1.0)
pH: 5.5 (ref 5.0–8.0)

## 2010-08-13 LAB — DIFFERENTIAL
Basophils Absolute: 0 10*3/uL (ref 0.0–0.1)
Basophils Relative: 0 % (ref 0–1)
Eosinophils Absolute: 0.1 10*3/uL (ref 0.0–0.7)
Eosinophils Relative: 1 % (ref 0–5)
Lymphocytes Relative: 4 % — ABNORMAL LOW (ref 12–46)
Lymphs Abs: 0.7 10*3/uL (ref 0.7–4.0)
Monocytes Absolute: 0.6 10*3/uL (ref 0.1–1.0)
Monocytes Relative: 4 % (ref 3–12)
Neutro Abs: 16.9 10*3/uL — ABNORMAL HIGH (ref 1.7–7.7)
Neutrophils Relative %: 92 % — ABNORMAL HIGH (ref 43–77)

## 2010-08-13 LAB — URINE MICROSCOPIC-ADD ON

## 2010-08-13 LAB — CARDIAC PANEL(CRET KIN+CKTOT+MB+TROPI)
CK, MB: 1.2 ng/mL (ref 0.3–4.0)
Relative Index: INVALID (ref 0.0–2.5)
Total CK: 59 U/L (ref 7–177)
Troponin I: <0.3 ng/mL (ref <0.30)

## 2010-08-13 LAB — TROPONIN I: Troponin I: <0.3 ng/mL (ref <0.30)

## 2010-08-13 NOTE — H&P (Signed)
NAME:  Brittany Ford, Brittany Ford NO.:  [id number]  MEDICAL RECORD NO.:  [id number]  LOCATION:  [facility]                         FACILITY:  [facility]  PHYSICIAN:  Zannie Cove, MD     DATE OF BIRTH:  Sep 30, 1945  DATE OF ADMISSION:  08/13/2010 DATE OF DISCHARGE:                             HISTORY & PHYSICAL   PRIMARY CARE PHYSICIAN:  Dr. [name] at [facility].  CHIEF COMPLAINT:  Nausea, vomiting, diarrhea, and passing out.  HISTORY OF PRESENT ILLNESS:  Brittany Ford is a 65 year old African American female with history of degenerative joint disease, remote history of diverticulitis started having symptoms of nausea, vomiting, and diarrhea starting today.  She reports about 4 to 5 episodes of loose stools without any blood in it today associated with clear nonbilious vomiting.  Subsequently, decided to go to the [facility] walk-in clinic. Over there, she had 2 more episodes of diarrhea and 2 episodes of vomiting and during one of these vomiting bouts, she bent over and the next thing she knew she was lying on the bathroom floor.  Probably passed out transiently for a few seconds.  Her family was outside and they heard a thumping, opened the door, and found her lying on the bathroom floor. The patient was immediately awake after this.  Denies any chest pain, palpitations, shortness of breath.  She denies fevers or chills.  She denies being on any recent antibiotics.  She denies eating outside.  She denies recent travel.  She denies any sick contacts.  MEDICATIONS:  None.  PAST MEDICAL HISTORY: 1. Significant for history of degenerative joint disease. 2. History of palpitations off and on for 1 year. 3. History of stress test reportedly a year ago normal. 4. History of tobacco use.  ALLERGIES:  No known drug allergies.  SOCIAL HISTORY:  Lives alone at home, has two adult grown children who check on her.  Denies any history of smoking.  Was a former  alcoholic, quit many years ago.  She is independent in all ADLs.  FAMILY HISTORY:  Significant for heart disease in both parents.  Mother died of a heart attack.  REVIEW OF SYSTEMS:  Positive for sinusitis congestion and sinus headache.  PHYSICAL EXAMINATION:  VITAL SIGNS:  Temperature is 97.7, pulses is 83, blood pressure 149/62.  Orthostatics pending.  Respirations 18, satting 99% on 2 L. GENERAL:  This is a middle-aged [place] American female lying in the stretcher in no acute distress.  General exam, she is alert, awake, oriented x3. HEENT:  Pupils equal reactive to light.  Oral mucosa is dry. NECK:  No JVD or lymphadenopathy. CARDIOVASCULAR:  S1, S2.  Regular rate and rhythm. LUNGS:  Mildly decreased breath sounds at the bases, otherwise clear. ABDOMEN:  Soft, minimally distended with no rigidity or tenderness.  No rebound.  No organomegaly.  No flank tenderness. EXTREMITIES:  Trace pitting edema in both lower extremities. NEURO:  Without any focal localizing signs.  LABORATORY DATA:  Shows white count of 18.3, hemoglobin 13.  Intact CO2 as a left shift, neutrophils are 92%.  B-mat is normal except for glucose of 178.  LFTs normal.  Troponin I less than 0.3.  Urine is cloudy with negative nitrite, leukocytes trace.  WBC count is not reported.  Chest x-ray shows no acute findings.  CT of the head also shows degenerative spondylosis, no acute findings.  CT C-spine shows degenerative spondylosis, no acute findings and CT head shows is a negative noncontrast head CT.  EKG sinus rhythm, poor R-wave progression otherwise no other acute ST-T wave changes.  No prior EKG for comparison.  ASSESSMENT:  Brittany Ford is a 65 year old female with: 1. Gastroenteritis. 2. Syncope likely vasovagal versus secondary to dehydration. 3. History of palpitations. 4. History of normal stress test 1 year ago. 5. Possible urinary tract infection.  PLAN:  We will admit her to telemetry floor  today.  Due to palpitations, we will check cardiac enzymes and a 2-D echo.  Also empirically start on IV Cipro and Flagyl.  Check C diff PCR and urine culture.  Further management as condition evolves.     Zannie Cove, MD     PJ/MEDQ  D:  08/13/2010  T:  08/13/2010  Job:  [id number]  cc:   [name], M.D.  Electronically Signed by Zannie Cove  on 08/13/2010 09:12:10 PM

## 2010-08-14 LAB — CARDIAC PANEL(CRET KIN+CKTOT+MB+TROPI)
CK, MB: 1.1 ng/mL (ref 0.3–4.0)
Relative Index: INVALID (ref 0.0–2.5)
Total CK: 57 U/L (ref 7–177)
Troponin I: <0.3 ng/mL (ref <0.30)

## 2010-08-15 LAB — COMPREHENSIVE METABOLIC PANEL
ALT: 9 U/L (ref 0–35)
AST: 13 U/L (ref 0–37)
Albumin: 3 g/dL — ABNORMAL LOW (ref 3.5–5.2)
Alkaline Phosphatase: 82 U/L (ref 39–117)
BUN: 12 mg/dL (ref 6–23)
CO2: 21 mEq/L (ref 19–32)
Calcium: 8.4 mg/dL (ref 8.4–10.5)
Chloride: 108 mEq/L (ref 96–112)
Creatinine, Ser: 0.74 mg/dL (ref 0.50–1.10)
GFR calc Af Amer: >60 mL/min (ref >60)
GFR calc non Af Amer: >60 mL/min (ref >60)
Glucose, Bld: 108 mg/dL — ABNORMAL HIGH (ref 70–99)
Potassium: 3.4 mEq/L — ABNORMAL LOW (ref 3.5–5.1)
Sodium: 140 mEq/L (ref 135–145)
Total Bilirubin: 0.2 mg/dL — ABNORMAL LOW (ref 0.3–1.2)
Total Protein: 6.3 g/dL (ref 6.0–8.3)

## 2010-08-15 LAB — CBC
HCT: 32.6 % — ABNORMAL LOW (ref 36.0–46.0)
Hemoglobin: 10.5 g/dL — ABNORMAL LOW (ref 12.0–15.0)
MCH: 26.5 pg (ref 26.0–34.0)
MCHC: 32.2 g/dL (ref 30.0–36.0)
MCV: 82.3 fL (ref 78.0–100.0)
Platelets: 243 10*3/uL (ref 150–400)
RBC: 3.96 MIL/uL (ref 3.87–5.11)
RDW: 14.7 % (ref 11.5–15.5)
WBC: 11 10*3/uL — ABNORMAL HIGH (ref 4.0–10.5)

## 2010-08-15 LAB — URINE CULTURE
Colony Count: 80000
Culture  Setup Time: 201207080227

## 2010-08-16 NOTE — Discharge Summary (Signed)
NAME:  Brittany Ford, Brittany Ford NO.:  [id number]  MEDICAL RECORD NO.:  [id number]  LOCATION:  3711                         FACILITY:  [facility]  PHYSICIAN:  Standley Dakins, MD   DATE OF BIRTH:  1945-08-25  DATE OF ADMISSION:  08/13/2010 DATE OF DISCHARGE:  08/15/2010                              DISCHARGE SUMMARY   PRIMARY CARE PHYSICIAN:  [name], M.D. at [facility]  DISCHARGE DIAGNOSES: 1. Gastroenteritis - resolved. 2. Sinusitis and bronchitis. 3. Hypertension. 4. History of diverticulitis. 5. History of palpitations. 6. History of tobacco use. 7. Syncopal episode thought to be vasovagal secondary to dehydration.  DISCHARGE MEDICATIONS: 1. Guaifenesin 600 mg tabs long-acting one p.o. b.i.d. 2. Lisinopril 10 mg one p.o. daily. 3. Avelox 400 mg one p.o. daily for 5 days. 4. Tramadol 50 mg one p.o. q.6 h. p.r.n. pain. 5. Advil 200 mg one p.o. p.r.n. pain. 6. Aspirin 81 mg one p.o. daily. 7. Flaxseed oil 1 tablet by mouth daily. 8. Niacin 500 mg one p.o. by mouth daily. 9. Pseudoephedrine p.r.n. 10.Albuterol HFA 2 puffs q.3-4 h p.r.n. shortness of breath and     wheezing.  HOSPITAL COURSE:  Briefly, this patient is a 65 year old female who was admitted to the hospital with nausea, vomiting, diarrhea, and an episode of passing out.  She had been dehydrated.  She had 4-5 bouts of diarrhea.  The diarrhea was non bloody and associated with clear nonbilious vomiting.  She went into the walk-in clinic at [facility] and had two more episodes of diarrhea.  She was sent into the emergency department, treated with IV fluids and hospital admission was requested. The patient was admitted and started on Avelox.  She tolerated it very well.  Her symptoms improved considerably.  She had no further diarrhea and no diarrhea in the hospital.  The patient had no palpitations.  The telemetry monitoring revealed no significant abnormalities.  The patient also had an  echocardiogram that revealed moderate concentric hypertrophy of the left ventricle, systolic function was vigorous with an ejection fraction estimated at 65% to 70% with normal wall motion.  The patient was ruled out for myocardial injury with serial cardiac enzymes.  The patient had a urinalysis that initially revealed ketones, trace blood, but the microscopic evaluation and urine culture did not reveal a significant urinary tract infection.  The patient began to have symptoms of sinusitis and continued Avelox treatment.  She also had some bronchitis symptoms and had nebulizer treatment.  She is going to be discharged with an albuterol HFA to use p.r.n.  DISCHARGE CONDITION:  Stable, at baseline.  DISPOSITION:  The patient will be discharged home with family members.  ACTIVITY:  Ad lib.  FOLLOWUP:  Followup with Dr. Lenise Arena in 1 week, have her blood pressure rechecked.  SPECIAL INSTRUCTIONS: 1. We did start you on a blood pressure medication.  I would like to     have your blood pressure retested with her primary care physician     next week. 2. Return if symptoms recur, worsen, or new changes develop. 3. Discuss wearing a Holter monitor with your primary care physician     to evaluate for palpitations.  08/15/2010 DISCHARGE PHYSICAL EXAMINATION:  GENERAL:  The patient was evaluated on the day of discharge.  The patient was slightly agitated, but cooperative and pleasant in no distress. HEENT:  Normocephalic, atraumatic.  Mucous membranes moist.  The patient is asymptomatic. NECK:  Supple.  Thyroid is soft.  No nodules or masses palpated. LUNGS:  Bilateral breath sounds, clear to auscultation.  No crackles, wheezes, or rhonchi.  The patient does have expiratory wheezes heard on exam. ABDOMEN:  Soft, nondistended, nontender.  No masses palpated. EXTREMITIES:  No cyanosis or clubbing. NEUROLOGIC:  No focal deficits. SKIN:  No gross lesions noted.  LABORATORY DATA:  White  blood cell count 11.0, hemoglobin 10.5, hematocrit 32.6, platelet count 243.  Sodium 140, potassium 3.4, chloride 108, CO2 of 21, BUN 12, creatinine 0.74, AST 13, ALT 9, calcium 8.4, albumin 3.0.  IMPRESSION:  The patient is evaluated and determined to be stable for discharge with close followup with her primary care physician.  I spent 37 minutes preparing this patient's discharge including reviewing her medical records, counseling with her about the results of tests that we did in the hospital, reconciling medications.     Standley Dakins, MD     CJ/MEDQ  D:  08/15/2010  T:  08/15/2010  Job:  [id number]  cc:   [name], M.D.  Electronically Signed by Standley Dakins  on 08/16/2010 07:06:25 PM

## 2011-07-21 ENCOUNTER — Encounter: Payer: Self-pay | Admitting: Cardiology

## 2011-12-06 ENCOUNTER — Other Ambulatory Visit (HOSPITAL_COMMUNITY)
Admission: RE | Admit: 2011-12-06 | Discharge: 2011-12-06 | Disposition: A | Discharge status: Home / Self Care | Payer: Medicare Other | Source: Ambulatory Visit | Attending: Family Medicine | Admitting: Family Medicine

## 2011-12-06 ENCOUNTER — Other Ambulatory Visit: Payer: Self-pay | Admitting: Family Medicine

## 2011-12-06 DIAGNOSIS — Z124 Encounter for screening for malignant neoplasm of cervix: Secondary | ICD-10-CM | POA: Insufficient documentation

## 2011-12-07 ENCOUNTER — Other Ambulatory Visit: Payer: Self-pay | Admitting: Family Medicine

## 2011-12-07 DIAGNOSIS — Z1231 Encounter for screening mammogram for malignant neoplasm of breast: Secondary | ICD-10-CM

## 2012-01-02 ENCOUNTER — Other Ambulatory Visit: Payer: Self-pay | Admitting: Family Medicine

## 2012-01-02 DIAGNOSIS — M79606 Pain in leg, unspecified: Secondary | ICD-10-CM

## 2012-01-14 ENCOUNTER — Observation Stay (HOSPITAL_COMMUNITY)
Admission: EM | Admit: 2012-01-14 | Discharge: 2012-01-15 | Disposition: A | Discharge status: Home / Self Care | Payer: Medicare Other | Attending: Emergency Medicine | Admitting: Emergency Medicine

## 2012-01-14 ENCOUNTER — Encounter (HOSPITAL_COMMUNITY): Payer: Self-pay | Admitting: *Deleted

## 2012-01-14 ENCOUNTER — Emergency Department (HOSPITAL_COMMUNITY): Payer: Medicare Other

## 2012-01-14 DIAGNOSIS — K219 Gastro-esophageal reflux disease without esophagitis: Secondary | ICD-10-CM | POA: Insufficient documentation

## 2012-01-14 DIAGNOSIS — Z7982 Long term (current) use of aspirin: Secondary | ICD-10-CM | POA: Insufficient documentation

## 2012-01-14 DIAGNOSIS — R002 Palpitations: Secondary | ICD-10-CM | POA: Insufficient documentation

## 2012-01-14 DIAGNOSIS — Z87891 Personal history of nicotine dependence: Secondary | ICD-10-CM | POA: Insufficient documentation

## 2012-01-14 DIAGNOSIS — E785 Hyperlipidemia, unspecified: Secondary | ICD-10-CM | POA: Insufficient documentation

## 2012-01-14 DIAGNOSIS — R079 Chest pain, unspecified: Principal | ICD-10-CM | POA: Insufficient documentation

## 2012-01-14 DIAGNOSIS — E669 Obesity, unspecified: Secondary | ICD-10-CM | POA: Insufficient documentation

## 2012-01-14 DIAGNOSIS — E119 Type 2 diabetes mellitus without complications: Secondary | ICD-10-CM | POA: Insufficient documentation

## 2012-01-14 DIAGNOSIS — IMO0001 Reserved for inherently not codable concepts without codable children: Secondary | ICD-10-CM | POA: Diagnosis present

## 2012-01-14 LAB — TROPONIN I
Troponin I: <0.3 ng/mL (ref <0.30)
Troponin I: <0.3 ng/mL (ref <0.30)
Troponin I: <0.3 ng/mL (ref <0.30)

## 2012-01-14 LAB — POCT I-STAT, CHEM 8
BUN: 12 mg/dL (ref 6–23)
Calcium, Ion: 1.15 mmol/L (ref 1.13–1.30)
Chloride: 109 mEq/L (ref 96–112)
Creatinine, Ser: 0.8 mg/dL (ref 0.50–1.10)
Glucose, Bld: 151 mg/dL — ABNORMAL HIGH (ref 70–99)
HCT: 33 % — ABNORMAL LOW (ref 36.0–46.0)
Hemoglobin: 11.2 g/dL — ABNORMAL LOW (ref 12.0–15.0)
Potassium: 3.7 mEq/L (ref 3.5–5.1)
Sodium: 140 mEq/L (ref 135–145)
TCO2: 23 mmol/L (ref 0–100)

## 2012-01-14 LAB — POCT I-STAT TROPONIN I: Troponin i, poc: 0 ng/mL (ref 0.00–0.08)

## 2012-01-14 LAB — GLUCOSE, CAPILLARY
Glucose-Capillary: 132 mg/dL — ABNORMAL HIGH (ref 70–99)
Glucose-Capillary: 210 mg/dL — ABNORMAL HIGH (ref 70–99)
Glucose-Capillary: 110 mg/dL — ABNORMAL HIGH (ref 70–99)
Glucose-Capillary: 125 mg/dL — ABNORMAL HIGH (ref 70–99)

## 2012-01-14 LAB — CK TOTAL AND CKMB (NOT AT ARMC)
CK, MB: 1.1 ng/mL (ref 0.3–4.0)
CK, MB: 1.1 ng/mL (ref 0.3–4.0)
Relative Index: INVALID (ref 0.0–2.5)
Relative Index: INVALID (ref 0.0–2.5)
Total CK: 58 U/L (ref 7–177)
Total CK: 59 U/L (ref 7–177)

## 2012-01-14 MED ORDER — NITROGLYCERIN 2 % TD OINT
1.0000 [in_us] | TOPICAL_OINTMENT | Freq: Four times a day (QID) | TRANSDERMAL | Status: DC
  Filled 2012-01-14: qty 30

## 2012-01-14 MED ORDER — SODIUM CHLORIDE 0.9 % IJ SOLN
3.0000 mL | Freq: Two times a day (BID) | INTRAMUSCULAR | Status: DC
  Administered 2012-01-14: 3 mL via INTRAVENOUS

## 2012-01-14 MED ORDER — INSULIN ASPART 100 UNIT/ML ~~LOC~~ SOLN: 0.0000 [IU] | Freq: Three times a day (TID) | SUBCUTANEOUS | Status: DC

## 2012-01-14 MED ORDER — ENOXAPARIN SODIUM 150 MG/ML ~~LOC~~ SOLN: 1.0000 mg/kg | Freq: Two times a day (BID) | SUBCUTANEOUS | Status: DC

## 2012-01-14 MED ORDER — ACETAMINOPHEN 650 MG RE SUPP: 650.0000 mg | Freq: Four times a day (QID) | RECTAL | Status: DC | PRN

## 2012-01-14 MED ORDER — ENOXAPARIN SODIUM 60 MG/0.6ML ~~LOC~~ SOLN
50.0000 mg | SUBCUTANEOUS | Status: DC
  Filled 2012-01-14 (×2): qty 0.6

## 2012-01-14 MED ORDER — OXYCODONE HCL 5 MG PO TABS: 5.0000 mg | ORAL_TABLET | ORAL | Status: DC | PRN

## 2012-01-14 MED ORDER — SODIUM CHLORIDE 0.9 % IV SOLN: 250.0000 mL | INTRAVENOUS | Status: DC | PRN

## 2012-01-14 MED ORDER — HYDROMORPHONE HCL PF 1 MG/ML IJ SOLN: 0.5000 mg | INTRAMUSCULAR | Status: DC | PRN

## 2012-01-14 MED ORDER — ACETAMINOPHEN 325 MG PO TABS: 650.0000 mg | ORAL_TABLET | Freq: Four times a day (QID) | ORAL | Status: DC | PRN

## 2012-01-14 MED ORDER — ENOXAPARIN SODIUM 40 MG/0.4ML ~~LOC~~ SOLN
40.0000 mg | SUBCUTANEOUS | Status: DC
  Filled 2012-01-14: qty 0.4

## 2012-01-14 MED ORDER — ONDANSETRON HCL 4 MG PO TABS: 4.0000 mg | ORAL_TABLET | Freq: Four times a day (QID) | ORAL | Status: DC | PRN

## 2012-01-14 MED ORDER — ZOLPIDEM TARTRATE 5 MG PO TABS: 5.0000 mg | ORAL_TABLET | Freq: Every evening | ORAL | Status: DC | PRN

## 2012-01-14 MED ORDER — ASPIRIN EC 325 MG PO TBEC
325.0000 mg | DELAYED_RELEASE_TABLET | Freq: Every day | ORAL | Status: DC
  Administered 2012-01-14: 325 mg via ORAL
  Filled 2012-01-14 (×2): qty 1

## 2012-01-14 MED ORDER — ONDANSETRON HCL 4 MG/2ML IJ SOLN: 4.0000 mg | Freq: Four times a day (QID) | INTRAMUSCULAR | Status: DC | PRN

## 2012-01-14 MED ORDER — INSULIN ASPART 100 UNIT/ML ~~LOC~~ SOLN: 0.0000 [IU] | Freq: Every day | SUBCUTANEOUS | Status: DC

## 2012-01-14 MED ORDER — SODIUM CHLORIDE 0.9 % IJ SOLN: 3.0000 mL | INTRAMUSCULAR | Status: DC | PRN

## 2012-01-14 MED ORDER — ALUM & MAG HYDROXIDE-SIMETH 200-200-20 MG/5ML PO SUSP: 30.0000 mL | Freq: Four times a day (QID) | ORAL | Status: DC | PRN

## 2012-01-14 NOTE — H&P (Signed)
[facility] History and Physical  Brittany Ford ZOX:[id number] DOB: January 16, 1946 DOA: 01/14/2012  Referring physician: EDP PCP: No primary provider on file.  Specialists:   Chief Complaint: Chest Pain  HPI: Brittany Ford is a 66 y.o. female who presents to the ED with complaints of SSCP radiating into the left arm that started 30 minutes prior to arrival to the ED.  She describes the pain as sharp and had associated SOB and Diaphoresis.  She took full dose Aspirin at home and the pain did not resolve until she arrived at the ED.  She reports having a Stress test in the past, but denies having any cardiac history.       Review of Systems: The patient denies anorexia, fever, weight loss, vision loss, decreased hearing, hoarseness,  syncope, dyspnea on exertion, peripheral edema, balance deficits, hemoptysis, abdominal pain, melena, hematochezia, severe indigestion/heartburn, hematuria, incontinence, dysuria, muscle weakness, suspicious skin lesions, transient blindness, difficulty walking, depression, unusual weight change, abnormal bleeding, enlarged lymph nodes, angioedema, and breast masses.    Past Medical History  Diagnosis Date  . Chest pain     with typical and atypical qualities  . GERD (gastroesophageal reflux disease)   . Chronic low back pain   . Obesity    Past Surgical History  Procedure Date  . Foot surgery     x2  . Tonsillectomy      Medications:  HOME MEDS: Prior to Admission medications   Medication Sig Start Date End Date Taking? Authorizing Provider  aspirin EC 81 MG tablet Take 81 mg by mouth daily.   Yes Historical Provider, MD  baclofen (LIORESAL) 10 MG tablet Take 10 mg by mouth 2 (two) times daily.   Yes Historical Provider, MD  CALCIUM PO Take 1 tablet by mouth daily.   Yes Historical Provider, MD  ibuprofen (ADVIL,MOTRIN) 200 MG tablet Take 800 mg by mouth every 6 (six) hours as needed. For pain   Yes Historical Provider, MD  Multiple  Vitamin (MULTIVITAMIN) tablet Take 1 tablet by mouth daily.   Yes Historical Provider, MD  niacin 50 MG tablet Take 50 mg by mouth daily with breakfast.   Yes Historical Provider, MD  Omega-3 Fatty Acids (FISH OIL) 1000 MG CAPS Take 1,000 mg by mouth 3 (three) times daily.   Yes Historical Provider, MD  vitamin B-12 (CYANOCOBALAMIN) 100 MCG tablet Take 50 mcg by mouth daily.   Yes Historical Provider, MD  Vitamin D, Ergocalciferol, (DRISDOL) 50000 UNITS CAPS Take 50,000 Units by mouth every 7 (seven) days. sunday   Yes Historical Provider, MD    Allergies:  No Known Allergies  Social History:   reports that she quit smoking about 4 years ago. Her smoking use included Cigarettes. She has a 10 pack-year smoking history. She does not have any smokeless tobacco history on file. She reports that she does not drink alcohol. Her drug history not on file.   Family History  Problem Relation Age of Onset  . COPD Father   . Heart attack Father   . Lung cancer Father   . Breast cancer Mother   . Heart attack Brother     Physical Exam:  GEN:  Pleasant examined  and in no acute distress; cooperative with exam Filed Vitals:   01/14/12 0500 01/14/12 0515 01/14/12 0530 01/14/12 0545  BP: 148/61 133/58 119/54 128/66  Pulse: 68 69 66 65  Temp:      TempSrc:      Resp: 13 14  14 14  SpO2: 99% 98% 97% 95%   Blood pressure 128/66, pulse 65, temperature 98 F (36.7 C), temperature source Oral, resp. rate 14, SpO2 95.00%. PSYCH: She is alert and oriented x4; does not appear anxious does not appear depressed; affect is normal HEENT: Normocephalic and Atraumatic, Mucous membranes pink; PERRLA; EOM intact; Fundi:  Benign;  No scleral icterus, Nares: Patent, Oropharynx: Clear, Edentulous, Neck:  FROM, no cervical lymphadenopathy nor thyromegaly or carotid bruit; no JVD; Breasts:: Not examined CHEST WALL: No tenderness CHEST: Normal respiration, clear to auscultation bilaterally HEART: Regular rate and  rhythm; no murmurs rubs or gallops BACK: No kyphosis or scoliosis; no CVA tenderness ABDOMEN: Positive Bowel Sounds, Obese, soft non-tender; no masses, no organomegaly. Rectal Exam: Not done EXTREMITIES: No bone or joint deformity; age-appropriate arthropathy of the hands and knees; no cyanosis, clubbing or edema; no ulcerations. Genitalia: not examined PULSES: 2+ and symmetric SKIN: Normal hydration no rash or ulceration CNS: Cranial nerves 2-12 grossly intact no focal neurologic deficit    Labs on Admission:  Basic Metabolic Panel:  Lab 01/14/12 1610  NA 140  K 3.7  CL 109  CO2 --  GLUCOSE 151*  BUN 12  CREATININE 0.80  CALCIUM --  MG --  PHOS --   Liver Function Tests: No results found for this basename: AST:5,ALT:5,ALKPHOS:5,BILITOT:5,PROT:5,ALBUMIN:5 in the last 168 hours No results found for this basename: LIPASE:5,AMYLASE:5 in the last 168 hours No results found for this basename: AMMONIA:5 in the last 168 hours CBC:  Lab 01/14/12 0329  WBC --  NEUTROABS --  HGB 11.2*  HCT 33.0*  MCV --  PLT --   Cardiac Enzymes: No results found for this basename: CKTOTAL:5,CKMB:5,CKMBINDEX:5,TROPONINI:5 in the last 168 hours  BNP (last 3 results) No results found for this basename: PROBNP:3 in the last 8760 hours CBG: No results found for this basename: GLUCAP:5 in the last 168 hours  Radiological Exams on Admission: Dg Chest 2 View  01/14/2012  *RADIOLOGY REPORT*  Clinical Data: Chest pain.  CHEST - 2 VIEW  Comparison: 08/13/2010  Findings: Biapical scarring.  Heart is upper limits normal in size. No acute opacities or effusions.  No acute bony abnormality.  IMPRESSION: No acute findings.   Original Report Authenticated By: Charlett Nose, M.D.     EKG: Independently reviewed.   Assessment/Plan Principal Problem:  *Chest pain Active Problems:  Palpitations  Hyperlipidemia  Diabetes mellitus   Plan:     Admit to Telemetry Bed Observation Status for Cardiac Rule  out Cycle Cardiac Enzymes Nitropaste, O2, ASA Reconcile Home Medications SSI coverage PRN DVT prophylaxis with Lovenox    Code Status:  FULL CODE Family Communication: N/A Disposition Plan: Return to Home  Time spent: 87 Minutes  Ron Parker [facility] Pager [phone] If 7PM-7AM, please contact night-coverage [email or web address] Password [email or web address] 01/14/2012, 6:20 AM

## 2012-01-14 NOTE — ED Notes (Signed)
Patient is alert and oriented x3.  She is complaining of chest pain that started earlier this evening. She states she has never had this type of chest pain with numbness and tingling in the left arm. She currently rates her pain 3 of 10.

## 2012-01-14 NOTE — ED Provider Notes (Signed)
History     CSN: [id number]  Arrival date & time 01/14/12  0219   First MD Initiated Contact with Patient 01/14/12 0236      Chief Complaint  Patient presents with  . Chest Pain    (Consider location/radiation/quality/duration/timing/severity/associated sxs/prior treatment) HPI Comments: Patient presents with chest pain. She states that she was at home this evening and started feeling like her heart was beating very hard and fast within normal and she developed some tightness in her chest radiating to her left arm. She also became dizzy and diaphoretic. She did have associated shortness of breath. She states that last about 20 minutes and went away after she took a 325 mg aspirin. She denies any past heart problems. Her last stress test was in 2008. She does have a history of hyperlipidemia and diet-controlled diabetes. Her primary care physician is with [facility] physicians. She denies any cough or chest congestion. She denies any leg pain or swelling.  Patient is a 66 y.o. female presenting with chest pain.  Chest Pain Primary symptoms include shortness of breath, palpitations and dizziness. Pertinent negatives for primary symptoms include no fever, no fatigue, no cough, no abdominal pain, no nausea and no vomiting.  The palpitations also occurred with dizziness and shortness of breath.   Dizziness also occurs with diaphoresis. Dizziness does not occur with nausea, vomiting or weakness.   Associated symptoms include diaphoresis.  Pertinent negatives for associated symptoms include no numbness and no weakness.     Past Medical History  Diagnosis Date  . Chest pain     with typical and atypical qualities  . GERD (gastroesophageal reflux disease)   . Chronic low back pain   . Obesity     Past Surgical History  Procedure Date  . Foot surgery     x2  . Tonsillectomy     Family History  Problem Relation Age of Onset  . COPD Father   . Heart attack Father   . Lung cancer  Father   . Breast cancer Mother   . Heart attack Brother     History  Substance Use Topics  . Smoking status: Former Smoker -- 0.5 packs/day for 20 years    Types: Cigarettes    Quit date: 07/21/2007  . Smokeless tobacco: Not on file  . Alcohol Use: No    OB History    Grav Para Term Preterm Abortions TAB SAB Ect Mult Living                  Review of Systems  Constitutional: Positive for diaphoresis. Negative for fever, chills and fatigue.  HENT: Negative for congestion, rhinorrhea and sneezing.   Eyes: Negative.   Respiratory: Positive for chest tightness and shortness of breath. Negative for cough.   Cardiovascular: Positive for chest pain and palpitations. Negative for leg swelling.  Gastrointestinal: Negative for nausea, vomiting, abdominal pain, diarrhea and blood in stool.  Genitourinary: Negative for frequency, hematuria, flank pain and difficulty urinating.  Musculoskeletal: Negative for back pain and arthralgias.  Skin: Negative for rash.  Neurological: Positive for dizziness. Negative for speech difficulty, weakness, numbness and headaches.    Allergies  Review of patient's allergies indicates no known allergies.  Home Medications   Current Outpatient Rx  Name  Route  Sig  Dispense  Refill  . ASPIRIN EC 81 MG PO TBEC   Oral   Take 81 mg by mouth daily.         Marland Kitchen BACLOFEN 10 MG  PO TABS   Oral   Take 10 mg by mouth 2 (two) times daily.         Marland Kitchen CALCIUM PO   Oral   Take 1 tablet by mouth daily.         . IBUPROFEN 200 MG PO TABS   Oral   Take 800 mg by mouth every 6 (six) hours as needed. For pain         . ONE-DAILY MULTI VITAMINS PO TABS   Oral   Take 1 tablet by mouth daily.         Marland Kitchen NIACIN 50 MG PO TABS   Oral   Take 50 mg by mouth daily with breakfast.         . FISH OIL 1000 MG PO CAPS   Oral   Take 1,000 mg by mouth 3 (three) times daily.         Marland Kitchen VITAMIN B-12 100 MCG PO TABS   Oral   Take 50 mcg by mouth daily.          Marland Kitchen VITAMIN D (ERGOCALCIFEROL) 50000 UNITS PO CAPS   Oral   Take 50,000 Units by mouth every 7 (seven) days. sunday           BP 174/86  Pulse 79  Temp 98 F (36.7 C) (Oral)  Resp 18  SpO2 98%  Physical Exam  Constitutional: She is oriented to person, place, and time. She appears well-developed and well-nourished.  HENT:  Head: Normocephalic and atraumatic.  Eyes: Pupils are equal, round, and reactive to light.  Neck: Normal range of motion. Neck supple.  Cardiovascular: Normal rate, regular rhythm and normal heart sounds.   Pulmonary/Chest: Effort normal and breath sounds normal. No respiratory distress. She has no wheezes. She has no rales. She exhibits no tenderness.  Abdominal: Soft. Bowel sounds are normal. There is no tenderness. There is no rebound and no guarding.  Musculoskeletal: Normal range of motion. She exhibits no edema.  Lymphadenopathy:    She has no cervical adenopathy.  Neurological: She is alert and oriented to person, place, and time.  Skin: Skin is warm and dry. No rash noted.  Psychiatric: She has a normal mood and affect.    ED Course  Procedures (including critical care time)  Results for orders placed during the hospital encounter of 01/14/12  POCT I-STAT, CHEM 8      Component Value Range   Sodium 140  135 - 145 mEq/L   Potassium 3.7  3.5 - 5.1 mEq/L   Chloride 109  96 - 112 mEq/L   BUN 12  6 - 23 mg/dL   Creatinine, Ser 1.61  0.50 - 1.10 mg/dL   Glucose, Bld 096 (*) 70 - 99 mg/dL   Calcium, Ion 0.45  4.09 - 1.30 mmol/L   TCO2 23  0 - 100 mmol/L   Hemoglobin 11.2 (*) 12.0 - 15.0 g/dL   HCT 81.1 (*) 91.4 - 78.2 %  POCT I-STAT TROPONIN I      Component Value Range   Troponin i, poc 0.00  0.00 - 0.08 ng/mL   Comment 3            Dg Chest 2 View  01/14/2012  *RADIOLOGY REPORT*  Clinical Data: Chest pain.  CHEST - 2 VIEW  Comparison: 08/13/2010  Findings: Biapical scarring.  Heart is upper limits normal in size. No acute opacities or  effusions.  No acute bony abnormality.  IMPRESSION: No acute findings.  Original Report Authenticated By: Charlett Nose, M.D.      Date: 01/14/2012  Rate: 80  Rhythm: normal sinus rhythm  QRS Axis: normal  Intervals: normal  ST/T Wave abnormalities: normal  Conduction Disutrbances:none  Narrative Interpretation:   Old EKG Reviewed: unchanged    1. Chest pain   2. Palpitation       MDM  Patient history of diet-controlled diabetes and hyperlipidemia presents with chest tightness, diaphoresis and dizziness associated with palpitations. I don't see any signs of arrhythmias currently. Her troponin is negative and her EKG did not show any ischemic changes. However given her risk factors I feel that she should be admitted for further cardiac evaluation. I will discuss this with the hospitalist team.        Rolan Bucco, MD 01/14/12 [phone]

## 2012-01-14 NOTE — Progress Notes (Signed)
Subjective: Patient seen and examined, denies chest pain at this time. Filed Vitals:   01/14/12 1030  BP: 119/49  Pulse: 68  Temp: 98.8 F (37.1 C)  Resp: 18    Chest: Clear Bilaterally Heart : S1S2 RRR Abdomen: Soft, nontender Ext : No edema Neuro: Alert, oriented x 3  A/P  Chest pain Diabetes mellitus  Continue medical management Will follow the cardiac enzymes, which have been negative so far.   Meredeth Ide [facility] [phone]

## 2012-01-14 NOTE — ED Notes (Addendum)
Attempted to call report.  Nurse was busy and said she would call back

## 2012-01-15 ENCOUNTER — Other Ambulatory Visit: Payer: Medicare Other

## 2012-01-15 LAB — BASIC METABOLIC PANEL
BUN: 12 mg/dL (ref 6–23)
CO2: 23 mEq/L (ref 19–32)
Calcium: 8.9 mg/dL (ref 8.4–10.5)
Chloride: 105 mEq/L (ref 96–112)
Creatinine, Ser: 0.85 mg/dL (ref 0.50–1.10)
GFR calc Af Amer: 82 mL/min — ABNORMAL LOW (ref >90)
GFR calc non Af Amer: 70 mL/min — ABNORMAL LOW (ref >90)
Glucose, Bld: 136 mg/dL — ABNORMAL HIGH (ref 70–99)
Potassium: 3.9 mEq/L (ref 3.5–5.1)
Sodium: 137 mEq/L (ref 135–145)

## 2012-01-15 LAB — CBC
HCT: 33.4 % — ABNORMAL LOW (ref 36.0–46.0)
Hemoglobin: 10.9 g/dL — ABNORMAL LOW (ref 12.0–15.0)
MCH: 27.1 pg (ref 26.0–34.0)
MCHC: 32.6 g/dL (ref 30.0–36.0)
MCV: 83.1 fL (ref 78.0–100.0)
Platelets: 274 10*3/uL (ref 150–400)
RBC: 4.02 MIL/uL (ref 3.87–5.11)
RDW: 14.1 % (ref 11.5–15.5)
WBC: 7.2 10*3/uL (ref 4.0–10.5)

## 2012-01-15 LAB — HEMOGLOBIN A1C
Hgb A1c MFr Bld: 6.7 % — ABNORMAL HIGH (ref <5.7)
Mean Plasma Glucose: 146 mg/dL — ABNORMAL HIGH (ref <117)

## 2012-01-15 LAB — GLUCOSE, CAPILLARY: Glucose-Capillary: 160 mg/dL — ABNORMAL HIGH (ref 70–99)

## 2012-01-15 NOTE — Discharge Summary (Signed)
Physician Discharge Summary  Brittany Ford ZOX:[id number] DOB: 1945-12-01 DOA: 01/14/2012  PCP: No primary provider on file.  Admit date: 01/14/2012 Discharge date: 01/15/2012  Time spent: 50  minutes  Recommendations for Outpatient Follow-up:  1. Outpatient Event monitor, Follow up cardiology as outpateint  Discharge Diagnoses:  Principal Problem:  *Chest pain Active Problems:  Palpitations  Hyperlipidemia  Diabetes mellitus   Discharge Condition: Stable  Diet recommendation: low salt diet  Filed Weights   01/14/12 0633  Weight: 101 kg (222 lb 10.6 oz)    History of present illness:  Brittany Ford is a 66 y.o. female who presents to the ED with complaints of SSCP radiating into the left arm that started 30 minutes prior to arrival to the ED. She describes the pain as sharp and had associated SOB and Diaphoresis. She took full dose Aspirin at home and the pain did not resolve until she arrived at the ED. She reports having a Stress test in the past, but denies having any cardiac history.    Hospital Course:   Chest pain Resolved, cardiac enzymes x 3 are negative.  Palpitations Patient had normal sinus rhythm on tele Will arrange for outpatient event monitor and follow up cardiology  Diabetes Mellitus Diet controlled  Procedures:  None  Consultations:  None  Discharge Exam: Filed Vitals:   01/14/12 1030 01/14/12 1345 01/14/12 2258 01/15/12 0500  BP: 119/49 124/52 140/64 145/65  Pulse: 68 64 60 62  Temp: 98.8 F (37.1 C) 98.5 F (36.9 C) 98 F (36.7 C) 98 F (36.7 C)  TempSrc: Oral Oral Oral Oral  Resp: 18 18 16 16   Height:      Weight:      SpO2: 97% 97% 96% 97%    General: Appear in no acute distress Cardiovascular: S1s2 regular Respiratory: clear bilaterally  Discharge Instructions  Discharge Orders    Future Appointments: Provider: Department: Dept Phone: Center:   01/17/2012 8:45 AM [facility] [place] [facility] [phone] [facility]   01/18/2012 11:30 AM [facility] [place]  [facility] [phone] [facility]     Future Orders Please Complete By Expires   Diet - low sodium heart healthy      Increase activity slowly      Discharge instructions      Comments:   Follow up [facility] cardiology to discuss the Event monitor results. Event monitor will be mailed to you by [facility] cardiology.       Medication List     As of 01/15/2012  8:56 AM    TAKE these medications         aspirin EC 81 MG tablet   Take 81 mg by mouth daily.      baclofen 10 MG tablet   Commonly known as: LIORESAL   Take 10 mg by mouth 2 (two) times daily.      CALCIUM PO   Take 1 tablet by mouth daily.      Fish Oil 1000 MG Caps   Take 1,000 mg by mouth 3 (three) times daily.      ibuprofen 200 MG tablet   Commonly known as: ADVIL,MOTRIN   Take 800 mg by mouth every 6 (six) hours as needed. For pain      multivitamin tablet   Take 1 tablet by mouth daily.      niacin 50 MG tablet   Take 50 mg by mouth daily with breakfast.  vitamin B-12 100 MCG tablet   Commonly known as: CYANOCOBALAMIN   Take 50 mcg by mouth daily.      Vitamin D (Ergocalciferol) 50000 UNITS Caps   Commonly known as: DRISDOL   Take 50,000 Units by mouth every 7 (seven) days. sunday           Follow-up Information    Follow up with Quintella Reichert, MD. On 02/05/2012. (2;30 pm)    Contact information:   [address] [place] [address] [phone]           The results of significant diagnostics from this hospitalization (including imaging, microbiology, ancillary and laboratory) are listed below for reference.    Significant Diagnostic Studies: Dg Chest 2 View  01/14/2012  *RADIOLOGY REPORT*  Clinical Data: Chest pain.  CHEST - 2 VIEW  Comparison: 08/13/2010  Findings: Biapical scarring.  Heart is upper limits normal in size. No acute opacities or effusions.  No acute bony  abnormality.  IMPRESSION: No acute findings.   Original Report Authenticated By: Charlett Nose, M.D.     Microbiology: No results found for this or any previous visit (from the past 240 hour(s)).   Labs: Basic Metabolic Panel:  Lab 01/15/12 7829 01/14/12 0329  NA 137 140  K 3.9 3.7  CL 105 109  CO2 23 --  GLUCOSE 136* 151*  BUN 12 12  CREATININE 0.85 0.80  CALCIUM 8.9 --  MG -- --  PHOS -- --   Liver Function Tests: No results found for this basename: AST:5,ALT:5,ALKPHOS:5,BILITOT:5,PROT:5,ALBUMIN:5 in the last 168 hours No results found for this basename: LIPASE:5,AMYLASE:5 in the last 168 hours No results found for this basename: AMMONIA:5 in the last 168 hours CBC:  Lab 01/15/12 0515 01/14/12 0329  WBC 7.2 --  NEUTROABS -- --  HGB 10.9* 11.2*  HCT 33.4* 33.0*  MCV 83.1 --  PLT 274 --   Cardiac Enzymes:  Lab 01/14/12 1820 01/14/12 1818 01/14/12 1222 01/14/12 0953  CKTOTAL 59 -- -- 58  CKMB 1.1 -- -- 1.1  CKMBINDEX -- -- -- --  TROPONINI -- <0.30 <0.30 <0.30   BNP: BNP (last 3 results) No results found for this basename: PROBNP:3 in the last 8760 hours CBG:  Lab 01/15/12 0726 01/14/12 2225 01/14/12 1649 01/14/12 1205 01/14/12 0737  GLUCAP 160* 125* 110* 210* 132*       Signed:  LAMA,GAGAN S  [facility] 01/15/2012, 8:56 AM

## 2012-01-17 ENCOUNTER — Other Ambulatory Visit: Payer: Medicare Other

## 2012-01-18 ENCOUNTER — Ambulatory Visit
Admission: RE | Admit: 2012-01-18 | Discharge: 2012-01-18 | Disposition: A | Discharge status: Home / Self Care | Payer: Medicare Other | Source: Ambulatory Visit | Attending: Family Medicine | Admitting: Family Medicine

## 2012-01-18 DIAGNOSIS — Z1231 Encounter for screening mammogram for malignant neoplasm of breast: Secondary | ICD-10-CM

## 2012-01-18 DIAGNOSIS — M79606 Pain in leg, unspecified: Secondary | ICD-10-CM

## 2012-01-26 ENCOUNTER — Emergency Department (HOSPITAL_COMMUNITY)
Admission: EM | Admit: 2012-01-26 | Discharge: 2012-01-26 | Disposition: A | Discharge status: Home / Self Care | Payer: Medicare Other | Attending: Emergency Medicine | Admitting: Emergency Medicine

## 2012-01-26 ENCOUNTER — Encounter (HOSPITAL_COMMUNITY): Payer: Self-pay | Admitting: *Deleted

## 2012-01-26 DIAGNOSIS — R42 Dizziness and giddiness: Secondary | ICD-10-CM | POA: Insufficient documentation

## 2012-01-26 DIAGNOSIS — M545 Low back pain, unspecified: Secondary | ICD-10-CM | POA: Insufficient documentation

## 2012-01-26 DIAGNOSIS — I480 Paroxysmal atrial fibrillation: Secondary | ICD-10-CM | POA: Insufficient documentation

## 2012-01-26 DIAGNOSIS — E669 Obesity, unspecified: Secondary | ICD-10-CM | POA: Insufficient documentation

## 2012-01-26 DIAGNOSIS — G8929 Other chronic pain: Secondary | ICD-10-CM | POA: Insufficient documentation

## 2012-01-26 DIAGNOSIS — Z87891 Personal history of nicotine dependence: Secondary | ICD-10-CM | POA: Insufficient documentation

## 2012-01-26 DIAGNOSIS — R55 Syncope and collapse: Secondary | ICD-10-CM | POA: Insufficient documentation

## 2012-01-26 DIAGNOSIS — Z7982 Long term (current) use of aspirin: Secondary | ICD-10-CM | POA: Insufficient documentation

## 2012-01-26 DIAGNOSIS — Z79899 Other long term (current) drug therapy: Secondary | ICD-10-CM | POA: Insufficient documentation

## 2012-01-26 DIAGNOSIS — I4891 Unspecified atrial fibrillation: Secondary | ICD-10-CM | POA: Insufficient documentation

## 2012-01-26 DIAGNOSIS — R0789 Other chest pain: Secondary | ICD-10-CM | POA: Insufficient documentation

## 2012-01-26 DIAGNOSIS — R002 Palpitations: Secondary | ICD-10-CM | POA: Insufficient documentation

## 2012-01-26 DIAGNOSIS — Z8719 Personal history of other diseases of the digestive system: Secondary | ICD-10-CM | POA: Insufficient documentation

## 2012-01-26 DIAGNOSIS — I4892 Unspecified atrial flutter: Secondary | ICD-10-CM | POA: Insufficient documentation

## 2012-01-26 HISTORY — DX: Unspecified atrial flutter: I48.92

## 2012-01-26 LAB — COMPREHENSIVE METABOLIC PANEL
ALT: 10 U/L (ref 0–35)
AST: 16 U/L (ref 0–37)
Albumin: 3.4 g/dL — ABNORMAL LOW (ref 3.5–5.2)
Alkaline Phosphatase: 79 U/L (ref 39–117)
BUN: 11 mg/dL (ref 6–23)
CO2: 25 mEq/L (ref 19–32)
Calcium: 9.1 mg/dL (ref 8.4–10.5)
Chloride: 105 mEq/L (ref 96–112)
Creatinine, Ser: 0.89 mg/dL (ref 0.50–1.10)
GFR calc Af Amer: 77 mL/min — ABNORMAL LOW (ref >90)
GFR calc non Af Amer: 67 mL/min — ABNORMAL LOW (ref >90)
Glucose, Bld: 145 mg/dL — ABNORMAL HIGH (ref 70–99)
Potassium: 3.8 mEq/L (ref 3.5–5.1)
Sodium: 140 mEq/L (ref 135–145)
Total Bilirubin: 0.2 mg/dL — ABNORMAL LOW (ref 0.3–1.2)
Total Protein: 7.2 g/dL (ref 6.0–8.3)

## 2012-01-26 LAB — POCT I-STAT TROPONIN I
Troponin i, poc: 0 ng/mL (ref 0.00–0.08)
Troponin i, poc: 0.01 ng/mL (ref 0.00–0.08)

## 2012-01-26 NOTE — ED Notes (Signed)
C/o intermittent episodes of palpitations, weakness, dizziness x 2 months. Reports today felt like she was about to pass out. Denies symptoms presently upon arrival to ED

## 2012-01-26 NOTE — ED Notes (Signed)
Admit date: 01/26/2012 Referring Physician Dr. Radford Pax Primary Cardiologist [name] Chief complaint/reason for admission: Palpitations  HPI: 60 or old and he was recently diagnosed with atrial fibrillation.  She was started on anticoagulation with Xarelto a few days ago.  This morning, she woke up and had a short episode of palpitations and some lightheadedness.  She did not pass out.  She denies any chest pain or shortness of breath.  The palpitations resolved spontaneously.  She was wearing a monitor from our office.  In checking with the [employer], she had a 90 second episode of atrial flutter this morning.  Her symptoms correlate with this time duration as well.  While in the emergency room, she has had PACs noted.  She has not felt the PACs that I witnessed on a monitor.  Overall, she is feeling much better and she would like to go home.    PMH:    Past Medical History  Diagnosis Date  . Chest pain     with typical and atypical qualities  . GERD (gastroesophageal reflux disease)   . Chronic low back pain   . Obesity     PSH:    Past Surgical History  Procedure Date  . Foot surgery     x2  . Tonsillectomy     ALLERGIES:   Review of patient's allergies indicates no known allergies.  Prior to Admit Meds:   (Not in a hospital admission) Family HX:    Family History  Problem Relation Age of Onset  . COPD Father   . Heart attack Father   . Lung cancer Father   . Breast cancer Mother   . Heart attack Brother    Social HX:    History   Social History  . Marital Status: Widowed    Spouse Name: N/A    Number of Children: 1  . Years of Education: N/A   Occupational History  . Not on file.   Social History Main Topics  . Smoking status: Former Smoker -- 0.5 packs/day for 20 years    Types: Cigarettes    Quit date: 07/21/2007  . Smokeless tobacco: Not on file  . Alcohol Use: No  . Drug Use: Not on file  . Sexually Active: Not on file   Other Topics  Concern  . Not on file   Social History Narrative  . No narrative on file     ROS:  All 11 ROS were addressed and are negative except what is stated in the HPI  PHYSICAL EXAM Filed Vitals:   01/26/12 1344  BP: 130/62  Pulse: 58  Temp:   Resp: 18   General: Well developed, well nourished, in no acute distress Head: Eyes PERRLA, No xanthomas.   Normal cephalic and atramatic  Lungs:   Clear bilaterally to auscultation and percussion. Heart:   HRRR S1 S2  Abdomen:  abdomen soft and non-tender  Msk:   Normal strength and tone for age. Extremities:   No  edema.   Neuro: Alert and oriented X 3. Psych:  Normal affect, responds appropriately   Labs:   Lab Results  Component Value Date   WBC 7.2 01/15/2012   HGB 10.9* 01/15/2012   HCT 33.4* 01/15/2012   MCV 83.1 01/15/2012   PLT 274 01/15/2012    Lab 01/26/12 1039  NA 140  K 3.8  CL 105  CO2 25  BUN 11  CREATININE 0.89  CALCIUM 9.1  PROT 7.2  BILITOT 0.2*  ALKPHOS 79  ALT 10  AST 16  GLUCOSE 145*   Lab Results  Component Value Date   CKTOTAL 59 01/14/2012   CKMB 1.1 01/14/2012   TROPONINI <0.30 01/14/2012   No results found for this basename: PTT   No results found for this basename: INR, PROTIME     No results found for this basename: CHOL   No results found for this basename: HDL   No results found for this basename: LDLCALC   No results found for this basename: TRIG   No results found for this basename: CHOLHDL   No results found for this basename: LDLDIRECT      Radiology:  No acute findings on chest x-ray a few days ago  EKG:  NSR with PACs, no ST segment changes  ASSESSMENT: Paroxysmal Atrial flutter; point of care troponin is negative today  PLAN:  Start metoprolol 25 mg BID.  This was prescribed in the office but she had not started this as she was waiting for her stress test.  The stress test was supposed to be today but she came to the emergency room due to her symptoms.  I have instructed  her to go ahead and start the metoprolol.  Could perform chemical stress test if needed.  She is also scheduled to have an echocardiogram.   Continue Xarelto for stroke prevention.  Stop aspirin.  Okay to discharge.  Followup with Dr. Mayford Knife next week.  I discussed the plan with Dr. Radford Pax.  Corky Crafts., MD  01/26/2012  2:06 PM

## 2012-01-26 NOTE — ED Notes (Signed)
Pt reports episodes of palpitations  Usually last approx a few seconds to 2 min. Today states was light headed, dizzy & near syncopal. +nausea, no emesis. Denied CP, SOB. Presently has a holter monitor on placed by cardiologist & had a nuclear stress test today which she was unable to make appt.

## 2012-01-26 NOTE — ED Provider Notes (Signed)
History     CSN: [id number]  Arrival date & time 01/26/12  4132   First MD Initiated Contact with Patient 01/26/12 1021      Chief Complaint  Patient presents with  . Irregular Heart Beat  . Near Syncope    (Consider location/radiation/quality/duration/timing/severity/associated sxs/prior treatment) The history is provided by the patient.   66yo F with DM and recently diagnosed with atrial fibrillation presents to the ED with continued intermittent episodes of palpitations, weakness, dizziness x 2 months. She was admitted to the hospital with chest pain 12/8-12/9 with negative troponins and normal EKG and was scheduled to f/u with Dr. Carolanne Grumbling with [facility]. She saw Dr. Mayford Knife on 12/16 and was scheduled for a stress test today. She was also to start Xarelto and start Metoprolol post stress test. She felt weak and dizzy today and called her Cardiologist's office and was told to come to the ED. She denies any chest pain, fevers, change in bowel or urinary habits.  Past Medical History  Diagnosis Date  . Chest pain     with typical and atypical qualities  . GERD (gastroesophageal reflux disease)   . Chronic low back pain   . Obesity     Past Surgical History  Procedure Date  . Foot surgery     x2  . Tonsillectomy     Family History  Problem Relation Age of Onset  . COPD Father   . Heart attack Father   . Lung cancer Father   . Breast cancer Mother   . Heart attack Brother     History  Substance Use Topics  . Smoking status: Former Smoker -- 0.5 packs/day for 20 years    Types: Cigarettes    Quit date: 07/21/2007  . Smokeless tobacco: Not on file  . Alcohol Use: No    OB History    Grav Para Term Preterm Abortions TAB SAB Ect Mult Living                  Review of Systems  All other systems reviewed and are negative.    Allergies  Review of patient's allergies indicates no known allergies.  Home Medications   Current Outpatient Rx  Name   Route  Sig  Dispense  Refill  . ASPIRIN 325 MG PO TABS   Oral   Take 325 mg by mouth daily.         Marland Kitchen BACLOFEN 10 MG PO TABS   Oral   Take 10 mg by mouth daily as needed. For back pain         . CALCIUM PO   Oral   Take 1 tablet by mouth daily.         Marland Kitchen VITAMIN B12 PO   Oral   Take 1 tablet by mouth daily.         . IBUPROFEN 200 MG PO TABS   Oral   Take 800 mg by mouth every 6 (six) hours as needed. For pain         . ONE-DAILY MULTI VITAMINS PO TABS   Oral   Take 1 tablet by mouth daily.         Marland Kitchen NIACIN 50 MG PO TABS   Oral   Take 50 mg by mouth daily with breakfast.         . FISH OIL 1000 MG PO CAPS   Oral   Take 3,000 mg by mouth daily.          Marland Kitchen  VITAMIN D (ERGOCALCIFEROL) 50000 UNITS PO CAPS   Oral   Take 50,000 Units by mouth every 7 (seven) days. sunday           BP 130/62  Pulse 58  Temp 97.8 F (36.6 C) (Oral)  Resp 18  SpO2 99%  Physical Exam  Constitutional: She is oriented to person, place, and time. She appears well-developed and well-nourished.  Cardiovascular: Intact distal pulses.        Irregularly irregular rate and rhythm  Pulmonary/Chest: Effort normal and breath sounds normal. No respiratory distress. She has no wheezes.  Abdominal: Soft. She exhibits no distension. There is no tenderness. There is no rebound and no guarding.  Musculoskeletal: She exhibits no edema and no tenderness.  Neurological: She is alert and oriented to person, place, and time. No cranial nerve deficit.  Psychiatric: She has a normal mood and affect. Her behavior is normal.    ED Course  Procedures (including critical care time)  Labs Reviewed  COMPREHENSIVE METABOLIC PANEL - Abnormal; Notable for the following:    Glucose, Bld 145 (*)     Albumin 3.4 (*)     Total Bilirubin 0.2 (*)     GFR calc non Af Amer 67 (*)     GFR calc Af Amer 77 (*)     All other components within normal limits  POCT I-STAT TROPONIN I  POCT I-STAT TROPONIN  I   No results found.   1. Atrial fibrillation      Date: 01/26/2012  Rate: 57  Rhythm: Sinus rhythm   QRS Axis: normal  Intervals: abnormal  ST/T Wave abnormalities: normal  Narrative Interpretation: Abnormal EKG  Old EKG Reviewed: changes noted     MDM  Cardiology consulted, recommended starting the Metoprolol Tartrate 25mg  BID and continuing the Xarelto 20mg  daily. The ASA was stopped. She is to follow up with her Cardiologist and her PCP and is to contact them today after she is discharged from the ED. She is to be discharged home.        Genelle Gather, MD 01/26/12 [phone]

## 2012-01-26 NOTE — Discharge Instructions (Signed)
**Be sure to follow up with your Cardiologist, Dr. Mayford Knife. You should call her today to schedule an appointment and to have your stress test rescheduled.   Atrial Fibrillation Your caregiver has diagnosed you with atrial fibrillation (AFib). The heart normally beats very regularly; AFib is a type of irregular heartbeat. The heart rate may be faster or slower than normal. This can prevent your heart from pumping as well as it should. AFib can be constant (chronic) or intermittent (paroxysmal). CAUSES  Atrial fibrillation may be caused by:  Heart disease, including heart attack, coronary artery disease, heart failure, diseases of the heart valves, and others.  Blood clot in the lungs (pulmonary embolism).  Pneumonia or other infections.  Chronic lung disease.  Thyroid disease.  Toxins. These include alcohol, some medications (such as decongestant medications or diet pills), and caffeine. In some people, no cause for AFib can be found. This is referred to as Lone Atrial Fibrillation. SYMPTOMS   Palpitations or a fluttering in your chest.  A vague sense of chest discomfort.  Shortness of breath.  Sudden onset of lightheadedness or weakness. Sometimes, the first sign of AFib can be a complication of the condition. This could be a stroke or heart failure. DIAGNOSIS  Your description of your condition may make your caregiver suspicious of atrial fibrillation. Your caregiver will examine your pulse to determine if fibrillation is present. An EKG (electrocardiogram) will confirm the diagnosis. Further testing may help determine what caused you to have atrial fibrillation. This may include chest x-ray, echocardiogram, blood tests, or CT scans. PREVENTION  If you have previously had atrial fibrillation, your caregiver may advise you to avoid substances known to cause the condition (such as stimulant medications, and possibly caffeine or alcohol). You may be advised to use medications to prevent  recurrence. Proper treatment of any underlying condition is important to help prevent recurrence. PROGNOSIS  Atrial fibrillation does tend to become a chronic condition over time. It can cause significant complications (see below). Atrial fibrillation is not usually immediately life-threatening, but it can shorten your life expectancy. This seems to be worse in women. If you have lone atrial fibrillation and are under 54 years old, the risk of complications is very low, and life expectancy is not shortened. RISKS AND COMPLICATIONS  Complications of atrial fibrillation can include stroke, chest pain, and heart failure. Your caregiver will recommend treatments for the atrial fibrillation, as well as for any underlying conditions, to help minimize risk of complications. TREATMENT  Treatment for AFib is divided into several categories:  Treatment of any underlying condition.  Converting you out of AFib into a regular (sinus) rhythm.  Controlling rapid heart rate.  Prevention of blood clots and stroke. Medications and procedures are available to convert your atrial fibrillation to sinus rhythm. However, recent studies have shown that this may not offer you any advantage, and cardiac experts are continuing research and debate on this topic. More important is controlling your rapid heartbeat. The rapid heartbeat causes more symptoms, and places strain on your heart. Your caregiver will advise you on the use of medications that can control your heart rate. Atrial fibrillation is a strong stroke risk. You can lessen this risk by taking blood thinning medications such as Coumadin (warfarin), or sometimes aspirin. These medications need close monitoring by your caregiver. Over-medication can cause bleeding. Too little medication may not protect against stroke. HOME CARE INSTRUCTIONS   If your caregiver prescribed medicine to make your heartbeat more normally, take as directed.  If blood thinners were  prescribed by your caregiver, take EXACTLY as directed.  Perform blood tests EXACTLY as directed.  Quit smoking. Smoking increases your cardiac and lung (pulmonary) risks.  DO NOT drink alcohol.  DO NOT drink caffeinated drinks (e.g. coffee, soda, chocolate, and leaf teas). You may drink decaffeinated coffee, soda or tea.  If you are overweight, you should choose a reduced calorie diet to lose weight. Please see a registered dietitian if you need more information about healthy weight loss. DO NOT USE DIET PILLS as they may aggravate heart problems.  If you have other heart problems that are causing AFib, you may need to eat a low salt, fat, and cholesterol diet. Your caregiver will tell you if this is necessary.  Exercise every day to improve your physical fitness. Stay active unless advised otherwise.  If your caregiver has given you a follow-up appointment, it is very important to keep that appointment. Not keeping the appointment could result in heart failure or stroke. If there is any problem keeping the appointment, you must call back to this facility for assistance. SEEK MEDICAL CARE IF:  You notice a change in the rate, rhythm or strength of your heartbeat.  You develop an infection or any other change in your overall health status. SEEK IMMEDIATE MEDICAL CARE IF:   You develop chest pain, abdominal pain, sweating, weakness or feel sick to your stomach (nausea).  You develop shortness of breath.  You develop swollen feet and ankles.  You develop dizziness, numbness, or weakness of your face or limbs, or any change in vision or speech. MAKE SURE YOU:   Understand these instructions.  Will watch your condition.  Will get help right away if you are not doing well or get worse. Document Released: 01/23/2005 Document Revised: 04/17/2011 Document Reviewed: 08/28/2007 [facility] Patient Information 2013 [place], [place]. Atrial Fibrillation Atrial fibrillation is an abnormal  heartbeat (rhythm). It can cause your heart rate to be faster or slower than normal, and can cause clots of blood to form in your heart. These clots can cause other health problems. Atrial fibrillation may be caused by a heart attack, lung problem, or certain medicine. Sometimes the cause of atrial fibrillation is not found. HOME CARE  Take blood thinning medicine (anticoagulants) as told by your doctor. Your doctor will need to draw your blood to check lab values if you take blood thinners.  If you had a cardioversion, limit your activity as told by your doctor.  Learn how to check your heartbeat (pulse) for an abnormal or irregular beat. Your doctor can show you how.  Ask your doctor if it is okay to exercise.  Only take medicine as told by your doctor. GET HELP RIGHT AWAY IF:   You have trouble breathing or feel dizzy.  You have puffy (swollen) feet or ankles.  You have blood in your pee (urine) or poop (bowel movement).  You feel your heart "skipping" beats.  You feel your heart "racing" or beating fast.  You have weakness in your arms or legs.  You have trouble talking, seeing, or thinking.  You have chest pain or pain in your arm or jaw.  MAKE SURE YOU:    Understand these instructions.Will watch your condition.  Will get help right away if you are not doing well or get worse. Document Released: 11/02/2007 Document Revised: 04/17/2011 Document Reviewed: 05/13/2009 [facility] Patient Information 2013 [place], [place].

## 2012-01-27 NOTE — ED Provider Notes (Addendum)
I saw and evaluated the patient, reviewed the resident's note and I agree with the findings and plan.   .Face to face Exam:  General:  Awake HEENT:  Atraumatic Resp:  Normal effort Abd:  Nondistended Neuro:No focal weakness   Nelia Shi, MD 06/12/12 2258

## 2012-02-28 ENCOUNTER — Inpatient Hospital Stay (HOSPITAL_COMMUNITY)
Admission: EM | Admit: 2012-02-28 | Discharge: 2012-03-03 | DRG: 378 | Disposition: A | Discharge status: Home / Self Care | Payer: Medicare Other | Attending: Internal Medicine | Admitting: Internal Medicine

## 2012-02-28 ENCOUNTER — Other Ambulatory Visit: Payer: Self-pay

## 2012-02-28 ENCOUNTER — Encounter (HOSPITAL_COMMUNITY): Payer: Self-pay | Admitting: *Deleted

## 2012-02-28 ENCOUNTER — Emergency Department (HOSPITAL_COMMUNITY): Payer: Medicare Other

## 2012-02-28 DIAGNOSIS — M549 Dorsalgia, unspecified: Secondary | ICD-10-CM | POA: Diagnosis present

## 2012-02-28 DIAGNOSIS — E119 Type 2 diabetes mellitus without complications: Secondary | ICD-10-CM | POA: Diagnosis present

## 2012-02-28 DIAGNOSIS — Z79899 Other long term (current) drug therapy: Secondary | ICD-10-CM

## 2012-02-28 DIAGNOSIS — Z87891 Personal history of nicotine dependence: Secondary | ICD-10-CM

## 2012-02-28 DIAGNOSIS — E669 Obesity, unspecified: Secondary | ICD-10-CM | POA: Diagnosis present

## 2012-02-28 DIAGNOSIS — K5730 Diverticulosis of large intestine without perforation or abscess without bleeding: Secondary | ICD-10-CM | POA: Diagnosis present

## 2012-02-28 DIAGNOSIS — K922 Gastrointestinal hemorrhage, unspecified: Principal | ICD-10-CM | POA: Diagnosis present

## 2012-02-28 DIAGNOSIS — K219 Gastro-esophageal reflux disease without esophagitis: Secondary | ICD-10-CM | POA: Diagnosis present

## 2012-02-28 DIAGNOSIS — I4891 Unspecified atrial fibrillation: Secondary | ICD-10-CM | POA: Diagnosis present

## 2012-02-28 DIAGNOSIS — Y9345 Activity, cheerleading: Secondary | ICD-10-CM | POA: Insufficient documentation

## 2012-02-28 DIAGNOSIS — K449 Diaphragmatic hernia without obstruction or gangrene: Secondary | ICD-10-CM | POA: Diagnosis present

## 2012-02-28 DIAGNOSIS — I480 Paroxysmal atrial fibrillation: Secondary | ICD-10-CM | POA: Diagnosis present

## 2012-02-28 DIAGNOSIS — G8929 Other chronic pain: Secondary | ICD-10-CM | POA: Diagnosis present

## 2012-02-28 DIAGNOSIS — IMO0001 Reserved for inherently not codable concepts without codable children: Secondary | ICD-10-CM | POA: Diagnosis present

## 2012-02-28 DIAGNOSIS — D62 Acute posthemorrhagic anemia: Secondary | ICD-10-CM | POA: Diagnosis present

## 2012-02-28 DIAGNOSIS — E785 Hyperlipidemia, unspecified: Secondary | ICD-10-CM | POA: Diagnosis present

## 2012-02-28 DIAGNOSIS — Z6831 Body mass index (BMI) 31.0-31.9, adult: Secondary | ICD-10-CM

## 2012-02-28 HISTORY — DX: Type 2 diabetes mellitus without complications: E11.9

## 2012-02-28 HISTORY — DX: Unspecified atrial fibrillation: I48.91

## 2012-02-28 LAB — CBC
HCT: 20 % — ABNORMAL LOW (ref 36.0–46.0)
Hemoglobin: 6.4 g/dL — CL (ref 12.0–15.0)
MCH: 27 pg (ref 26.0–34.0)
MCHC: 32 g/dL (ref 30.0–36.0)
MCV: 84.4 fL (ref 78.0–100.0)
Platelets: 310 10*3/uL (ref 150–400)
RBC: 2.37 MIL/uL — ABNORMAL LOW (ref 3.87–5.11)
RDW: 16.2 % — ABNORMAL HIGH (ref 11.5–15.5)
WBC: 8.7 10*3/uL (ref 4.0–10.5)

## 2012-02-28 LAB — BASIC METABOLIC PANEL
BUN: 13 mg/dL (ref 6–23)
CO2: 22 mEq/L (ref 19–32)
Calcium: 8.7 mg/dL (ref 8.4–10.5)
Chloride: 104 mEq/L (ref 96–112)
Creatinine, Ser: 0.7 mg/dL (ref 0.50–1.10)
GFR calc Af Amer: >90 mL/min (ref >90)
GFR calc non Af Amer: 89 mL/min — ABNORMAL LOW (ref >90)
Glucose, Bld: 180 mg/dL — ABNORMAL HIGH (ref 70–99)
Potassium: 3.7 mEq/L (ref 3.5–5.1)
Sodium: 137 mEq/L (ref 135–145)

## 2012-02-28 LAB — GLUCOSE, CAPILLARY: Glucose-Capillary: 107 mg/dL — ABNORMAL HIGH (ref 70–99)

## 2012-02-28 LAB — POCT I-STAT TROPONIN I: Troponin i, poc: 0 ng/mL (ref 0.00–0.08)

## 2012-02-28 LAB — MRSA PCR SCREENING: MRSA by PCR: NEGATIVE

## 2012-02-28 LAB — OCCULT BLOOD, POC DEVICE: Fecal Occult Bld: POSITIVE — AB

## 2012-02-28 LAB — ABO/RH: ABO/RH(D): O POS

## 2012-02-28 LAB — PREPARE RBC (CROSSMATCH)

## 2012-02-28 LAB — PRO B NATRIURETIC PEPTIDE: Pro B Natriuretic peptide (BNP): 75.4 pg/mL (ref 0–125)

## 2012-02-28 MED ORDER — PANTOPRAZOLE SODIUM 40 MG IV SOLR
8.0000 mg/h | INTRAVENOUS | Status: DC
  Administered 2012-02-28 – 2012-02-29 (×2): 8 mg/h via INTRAVENOUS
  Filled 2012-02-28 (×6): qty 80

## 2012-02-28 MED ORDER — PANTOPRAZOLE SODIUM 40 MG IV SOLR
40.0000 mg | Freq: Once | INTRAVENOUS | Status: AC
  Administered 2012-02-28: 40 mg via INTRAVENOUS
  Filled 2012-02-28: qty 40

## 2012-02-28 MED ORDER — ONDANSETRON HCL 4 MG PO TABS: 4.0000 mg | ORAL_TABLET | Freq: Four times a day (QID) | ORAL | Status: DC | PRN

## 2012-02-28 MED ORDER — LEVALBUTEROL HCL 0.63 MG/3ML IN NEBU
0.6300 mg | INHALATION_SOLUTION | Freq: Four times a day (QID) | RESPIRATORY_TRACT | Status: DC | PRN
  Filled 2012-02-28: qty 3

## 2012-02-28 MED ORDER — METOPROLOL TARTRATE 50 MG PO TABS
50.0000 mg | ORAL_TABLET | Freq: Two times a day (BID) | ORAL | Status: DC
  Administered 2012-02-28 – 2012-03-03 (×5): 50 mg via ORAL
  Filled 2012-02-28 (×9): qty 1

## 2012-02-28 MED ORDER — INSULIN ASPART 100 UNIT/ML ~~LOC~~ SOLN: 0.0000 [IU] | Freq: Three times a day (TID) | SUBCUTANEOUS | Status: DC

## 2012-02-28 MED ORDER — SODIUM CHLORIDE 0.9 % IV SOLN
INTRAVENOUS | Status: DC
  Administered 2012-02-29: 75 mL/h via INTRAVENOUS
  Administered 2012-03-01: 500 mL via INTRAVENOUS
  Administered 2012-03-03: 1000 mL via INTRAVENOUS

## 2012-02-28 MED ORDER — ONDANSETRON HCL 4 MG/2ML IJ SOLN: 4.0000 mg | Freq: Four times a day (QID) | INTRAMUSCULAR | Status: DC | PRN

## 2012-02-28 MED ORDER — ACETAMINOPHEN 325 MG PO TABS: 650.0000 mg | ORAL_TABLET | Freq: Four times a day (QID) | ORAL | Status: DC | PRN

## 2012-02-28 MED ORDER — ACETAMINOPHEN 650 MG RE SUPP: 650.0000 mg | Freq: Four times a day (QID) | RECTAL | Status: DC | PRN

## 2012-02-28 MED ORDER — SODIUM CHLORIDE 0.9 % IJ SOLN
3.0000 mL | Freq: Two times a day (BID) | INTRAMUSCULAR | Status: DC
  Administered 2012-02-28 – 2012-03-02 (×5): 3 mL via INTRAVENOUS

## 2012-02-28 NOTE — ED Notes (Signed)
Pt reports feeling sob and lightheaded since Sunday. Pt appears very pale. Reports dark stools recently. ekg done at triage, no resp distress noted.

## 2012-02-28 NOTE — H&P (Addendum)
[facility] History and Physical  Brittany Ford DGU:[id number] DOB: 01/15/1946 DOA: 02/28/2012  Referring physician: ER PCP: [name], MD   Chief Complaint: GI bleeding  HPI:  67 year old female with history of atrial flutter/atrial fibrillation who is on RIVAROXABAN, started in December presents with GI bleeding or significant drop in hemoglobin from 11-6.She has noticed over the last 3 days that when she stands up she gets lightheaded and develops a pounding headache. She feels generally weak. This associated with dyspnea. Denies chest pain, heaviness, tightness, pressure. She denies nausea or vomiting. She states that her stools have been darker than normal in complaint of melena for the last 3 days. She attributed this to eating some aureus but states that the dark stools has lasted longer than normal. She denies any abdominal pain. She is taking Xarelto for atrial fibrillation. Family member who is with her states that her pain is at her weakness and dyspnea are getting worse. She was found to be orthostatic and was found to have a positive Hemoccult in the ED. Orthostatic vital signs show heart rate increases by 26 on standing. She clearly has a GI bleed. She states that she started on her Xarelto one month ago because of atrial fibrillation found on a cardiac event monitor. She did not take her dose last night. Because this has not seemed to be a massive bleed, I do not feel she is a candidate for the rapid reversal of anticoagulation protocol. This is especially true since Xarelto does not respond very well to FEIBA.  And he has been made aware Dr. Madilyn Fireman of gastroenterology states he will see the patient in consultation tomorrow.          Review of Systems: negative for the following  Constitutional: Denies fever, chills, diaphoresis, appetite change and fatigue.  HEENT: Denies photophobia, eye pain, redness, hearing loss, ear pain, congestion, sore throat,  rhinorrhea, sneezing, mouth sores, trouble swallowing, neck pain, neck stiffness and tinnitus.  Respiratory:Positive for shortness of breath.  Cardiovascular: Denies chest pain, palpitations and leg swelling.  Gastrointestinal: Denies nausea, vomiting, abdominal pain, diarrhea, constipation, blood in stool and abdominal distention.  Genitourinary: Denies dysuria, urgency, frequency, hematuria, flank pain and difficulty urinating.  Musculoskeletal: Denies myalgias, back pain, joint swelling, arthralgias and gait problem.  Skin: Denies pallor, rash and wound.  Neurological: Denies dizziness, seizures, syncope, weakness, light-headedness, numbness and headaches.  Hematological: Denies adenopathy. Easy bruising, personal or family bleeding history  Psychiatric/Behavioral: Denies suicidal ideation, mood changes, confusion, nervousness, sleep disturbance and agitation       Past Medical History  Diagnosis Date  . Chest pain     with typical and atypical qualities  . GERD (gastroesophageal reflux disease)   . Chronic low back pain   . Obesity   . Atrial flutter   . Atrial fibrillation      Past Surgical History  Procedure Date  . Foot surgery     x2  . Tonsillectomy       Social History:  reports that she quit smoking about 4 years ago. Her smoking use included Cigarettes. She has a 10 pack-year smoking history. She does not have any smokeless tobacco history on file. She reports that she does not drink alcohol. Her drug history not on file.     No Known Allergies  Family History  Problem Relation Age of Onset  . COPD Father   . Heart attack Father   . Lung cancer Father   . Breast cancer  Mother   . Heart attack Brother      Prior to Admission medications   Medication Sig Start Date End Date Taking? Authorizing Provider  metoprolol (LOPRESSOR) 50 MG tablet Take 50 mg by mouth 2 (two) times daily.   Yes Historical Provider, MD  Rivaroxaban (XARELTO) 20 MG TABS Take 20  mg by mouth daily.   Yes Historical Provider, MD     Physical Exam: Filed Vitals:   02/28/12 1544 02/28/12 1545 02/28/12 1546 02/28/12 1822  BP: 127/50 132/56 146/51 129/51  Pulse: 55 73 81 76  Temp:    98.7 F (37.1 C)  TempSrc:    Oral  Resp:    18  SpO2:         Constitutional: Vital signs reviewed. Patient is a well-developed and well-nourished in no acute distress and cooperative with exam. Alert and oriented x3.  Head: Normocephalic and atraumatic  Ear: TM normal bilaterally  Mouth: no erythema or exudates, MMM  Eyes: PERRL, EOMI, conjunctivae normal, No scleral icterus.  Neck: Supple, Trachea midline normal ROM, No JVD, mass, thyromegaly, or carotid bruit present.  Cardiovascular: RRR, S1 normal, S2 normal, no MRG, pulses symmetric and intact bilaterally  Pulmonary/Chest: CTAB, no wheezes, rales, or rhonchi  Abdominal: Soft. Non-tender, non-distended, bowel sounds are normal, no masses, organomegaly, or guarding present.  GU: no CVA tenderness Musculoskeletal: No joint deformities, erythema, or stiffness, ROM full and no nontender Ext: no edema and no cyanosis, pulses palpable bilaterally (DP and PT)  Hematology: no cervical, inginal, or axillary adenopathy.  Neurological: A&O x3, Strenght is normal and symmetric bilaterally, cranial nerve II-XII are grossly intact, no focal motor deficit, sensory intact to light touch bilaterally.  Skin: Warm, dry and intact. No rash, cyanosis, or clubbing.  Psychiatric: Normal mood and affect. speech and behavior is normal. Judgment and thought content normal. Cognition and memory are normal.       Labs on Admission:    Basic Metabolic Panel:  Lab 02/28/12 1610  NA 137  K 3.7  CL 104  CO2 22  GLUCOSE 180*  BUN 13  CREATININE 0.70  CALCIUM 8.7  MG --  PHOS --   Liver Function Tests: No results found for this basename: AST:5,ALT:5,ALKPHOS:5,BILITOT:5,PROT:5,ALBUMIN:5 in the last 168 hours No results found for this  basename: LIPASE:5,AMYLASE:5 in the last 168 hours No results found for this basename: AMMONIA:5 in the last 168 hours CBC:  Lab 02/28/12 1558  WBC 8.7  NEUTROABS --  HGB 6.4*  HCT 20.0*  MCV 84.4  PLT 310   Cardiac Enzymes: No results found for this basename: CKTOTAL:5,CKMB:5,CKMBINDEX:5,TROPONINI:5 in the last 168 hours  BNP (last 3 results)  Basename 02/28/12 1547  PROBNP 75.4      CBG: No results found for this basename: GLUCAP:5 in the last 168 hours  Radiological Exams on Admission: Dg Chest Port 1 View  02/28/2012  *RADIOLOGY REPORT*  Clinical Data: Shortness of breath and dizziness.  PORTABLE CHEST - 1 VIEW  Comparison: PA and lateral chest 01/14/2012 and 08/13/2010.  Findings: Lungs are clear.  Heart size is normal.  No pneumothorax or pleural effusion.  IMPRESSION: No acute disease.   Original Report Authenticated By: Holley Dexter, M.D.     EKG: Independently reviewed. ECG shows normal sinus rhythm with a rate of 75, no ectopy. Normal axis. Normal P wave. Normal QRS. Normal intervals. Normal ST and T waves. Impression: normal ECG. When compared with ECG of 01/26/2012, heart rate has increased, PVCs are no  longer present   Assessment/Plan Principal Problem:  *Cheerleading Active Problems:  Atrial flutter  Anemia associated with acute blood loss   1. Melena we'll transfuse 2 units of packed red blood cells the patient will be kept n.p.o. past midnight for possible EGD in the morning. Although  BUN/creatinine within normal limits, probably upper GI, therefore we'll start the patient on a Protonix drip until EGD is done 2. Diabetes type 2 currently in her medications we'll check a hemoglobin A1c and start the patient on sliding scale insulin 3. He fibrillation currently in sinus rhythm on hold anticoagulation for now  Code Status:   full Family Communication: bedside Disposition Plan: admit   Time spent: 70 mins   [facility] Pager  [phone]  If 7PM-7AM, please contact night-coverage [email or web address] Password [facility] 02/28/2012, 6:32 PM

## 2012-02-28 NOTE — ED Notes (Signed)
I gave the patient a warm blanket. 

## 2012-02-28 NOTE — ED Notes (Signed)
REPORT GIVEN TO 3300 UNIT NURSE , TRANSPORTED IN STABLE CONDITION , 1ST UNIT PRBC INFUSING , PROTONIX DRIP INFUSING , DENIES PAIN / RESPIRATIONS UNLABORED.

## 2012-02-28 NOTE — ED Notes (Signed)
I gave the patient a second warm blanket.

## 2012-02-28 NOTE — ED Provider Notes (Signed)
History     CSN: [id number]  Arrival date & time 02/28/12  1500   First MD Initiated Contact with Patient 02/28/12 1515      Chief Complaint  Patient presents with  . Shortness of Breath  . Dizziness    (Consider location/radiation/quality/duration/timing/severity/associated sxs/prior treatment) Patient is a 67 y.o. female presenting with shortness of breath. The history is provided by the patient.  Shortness of Breath  Associated symptoms include shortness of breath.  She has noticed over the last 3 days that when she stands up she gets lightheaded and develops a pounding headache. She feels generally weak. This associated with dyspnea. Denies chest pain, heaviness, tightness, pressure. She denies nausea or vomiting. She states that her stools have been darker than normal. She attributed this to eating some aureus but states that the dark stools has lasted longer than normal. She denies any abdominal pain. She is taking Xarelto for atrial fibrillation. Family member who is with her states that her pain is at her weakness and dyspnea are getting worse. Patient states that her metoprolol dose was increased several weeks ago but there've been no other changes in her medications.  Past Medical History  Diagnosis Date  . Chest pain     with typical and atypical qualities  . GERD (gastroesophageal reflux disease)   . Chronic low back pain   . Obesity   . Atrial flutter   . Atrial fibrillation     Past Surgical History  Procedure Date  . Foot surgery     x2  . Tonsillectomy     Family History  Problem Relation Age of Onset  . COPD Father   . Heart attack Father   . Lung cancer Father   . Breast cancer Mother   . Heart attack Brother     History  Substance Use Topics  . Smoking status: Former Smoker -- 0.5 packs/day for 20 years    Types: Cigarettes    Quit date: 07/21/2007  . Smokeless tobacco: Not on file  . Alcohol Use: No    OB History    Grav Para Term Preterm  Abortions TAB SAB Ect Mult Living                  Review of Systems  Respiratory: Positive for shortness of breath.   All other systems reviewed and are negative.    Allergies  Review of patient's allergies indicates no known allergies.  Home Medications   Current Outpatient Rx  Name  Route  Sig  Dispense  Refill  . METOPROLOL TARTRATE 50 MG PO TABS   Oral   Take 50 mg by mouth 2 (two) times daily.         Marland Kitchen RIVAROXABAN 20 MG PO TABS   Oral   Take 20 mg by mouth daily.           BP 129/60  Pulse 77  Temp 97.9 F (36.6 C) (Oral)  Resp 20  SpO2 100%  Physical Exam  Nursing note and vitals reviewed.  67 year old female, resting comfortably and in no acute distress. Vital signs are normal. Oxygen saturation is 100%, which is normal. Head is normocephalic and atraumatic. PERRLA, EOMI. Oropharynx is clear. Neck is nontender and supple without adenopathy or JVD. Back is nontender and there is no CVA tenderness. Lungs are clear without rales, wheezes, or rhonchi. Chest is nontender. Heart has regular rate and rhythm without murmur. Abdomen is soft, flat, nontender without masses  or hepatosplenomegaly and peristalsis is normoactive. Rectal: Normal sphincter. Stool is greenish black and has been sent for Hemoccult testing. Extremities have trace edema, no cyanosis, full range of motion is present. Skin is warm and dry without rash. Neurologic: Mental status is normal, cranial nerves are intact, there are no motor or sensory deficits.  ED Course  Procedures (including critical care time)  Results for orders placed during the hospital encounter of 02/28/12  PRO B NATRIURETIC PEPTIDE      Component Value Range   Pro B Natriuretic peptide (BNP) 75.4  0 - 125 pg/mL  BASIC METABOLIC PANEL      Component Value Range   Sodium 137  135 - 145 mEq/L   Potassium 3.7  3.5 - 5.1 mEq/L   Chloride 104  96 - 112 mEq/L   CO2 22  19 - 32 mEq/L   Glucose, Bld 180 (*) 70 - 99  mg/dL   BUN 13  6 - 23 mg/dL   Creatinine, Ser 9.60  0.50 - 1.10 mg/dL   Calcium 8.7  8.4 - 45.4 mg/dL   GFR calc non Af Amer 89 (*) >90 mL/min   GFR calc Af Amer >90  >90 mL/min  CBC      Component Value Range   WBC 8.7  4.0 - 10.5 K/uL   RBC 2.37 (*) 3.87 - 5.11 MIL/uL   Hemoglobin 6.4 (*) 12.0 - 15.0 g/dL   HCT 09.8 (*) 11.9 - 14.7 %   MCV 84.4  78.0 - 100.0 fL   MCH 27.0  26.0 - 34.0 pg   MCHC 32.0  30.0 - 36.0 g/dL   RDW 82.9 (*) 56.2 - 13.0 %   Platelets 310  150 - 400 K/uL  TYPE AND SCREEN      Component Value Range   ABO/RH(D) O POS     Antibody Screen NEG     Sample Expiration 03/02/2012     Unit Number [id number]     Blood Component Type RED CELLS,LR     Unit division 00     Status of Unit ALLOCATED     Transfusion Status OK TO TRANSFUSE     Crossmatch Result Compatible     Unit Number [id number]     Blood Component Type RED CELLS,LR     Unit division 00     Status of Unit ISSUED     Transfusion Status OK TO TRANSFUSE     Crossmatch Result Compatible    OCCULT BLOOD, POC DEVICE      Component Value Range   Fecal Occult Bld POSITIVE (*) NEGATIVE  POCT I-STAT TROPONIN I      Component Value Range   Troponin i, poc 0.00  0.00 - 0.08 ng/mL   Comment 3           PREPARE RBC (CROSSMATCH)      Component Value Range   Order Confirmation ORDER PROCESSED BY BLOOD BANK    ABO/RH      Component Value Range   ABO/RH(D) O POS     Dg Chest Port 1 View  02/28/2012  *RADIOLOGY REPORT*  Clinical Data: Shortness of breath and dizziness.  PORTABLE CHEST - 1 VIEW  Comparison: PA and lateral chest 01/14/2012 and 08/13/2010.  Findings: Lungs are clear.  Heart size is normal.  No pneumothorax or pleural effusion.  IMPRESSION: No acute disease.   Original Report Authenticated By: Holley Dexter, M.D.      ECG shows normal sinus rhythm  with a rate of 75, no ectopy. Normal axis. Normal P wave. Normal QRS. Normal intervals. Normal ST and T waves. Impression: normal ECG.  When compared with ECG of 01/26/2012, heart rate has increased, PVCs are no longer present.   1. GI bleeding       MDM  Orthostatic dizziness and dyspnea with dark stools in a patient who is anticoagulated. Stool is been sent for Hemoccult testing and CBC will be checked.  Stool is Hemoccult positive and hemoglobin has come back with a 4 g drop. Orthostatic vital signs show heart rate increases by 26 on standing. She clearly has a GI bleed. She states that she started on her Xarelto one month ago because of atrial fibrillation found on a cardiac event monitor. She did not take her dose last night. Because this has not seemed to be a massive bleed, I do not feel she is a candidate for the rapid reversal of anticoagulation protocol. This is especially true since Xarelto does not respond very well to FEIBA. Case is discussed with Dr. Madilyn Fireman of gastroenterology states he will see the patient in consultation tomorrow. Case is discussed with Dr. Susie Cassette of [facility] who agrees to admit the patient.    Dione Booze, MD 02/28/12 [phone]

## 2012-02-28 NOTE — ED Notes (Signed)
Diabetic diet ordered.

## 2012-02-28 NOTE — ED Notes (Signed)
CBG was 107. Notified Nurse Reita Cliche.

## 2012-02-28 NOTE — ED Notes (Signed)
MD at bedside. 

## 2012-02-29 ENCOUNTER — Encounter (HOSPITAL_COMMUNITY): Payer: Self-pay | Admitting: *Deleted

## 2012-02-29 ENCOUNTER — Encounter (HOSPITAL_COMMUNITY)
Admission: EM | Disposition: A | Discharge status: Home / Self Care | Payer: Self-pay | Source: Home / Self Care | Attending: Internal Medicine

## 2012-02-29 DIAGNOSIS — I4891 Unspecified atrial fibrillation: Secondary | ICD-10-CM

## 2012-02-29 DIAGNOSIS — E119 Type 2 diabetes mellitus without complications: Secondary | ICD-10-CM

## 2012-02-29 HISTORY — PX: ESOPHAGOGASTRODUODENOSCOPY: SHX5428

## 2012-02-29 LAB — TYPE AND SCREEN
ABO/RH(D): O POS
Antibody Screen: NEGATIVE
Unit division: 0
Unit division: 0

## 2012-02-29 LAB — CBC
HCT: 26.3 % — ABNORMAL LOW (ref 36.0–46.0)
HCT: 25.7 % — ABNORMAL LOW (ref 36.0–46.0)
HCT: 27.6 % — ABNORMAL LOW (ref 36.0–46.0)
Hemoglobin: 8.9 g/dL — ABNORMAL LOW (ref 12.0–15.0)
Hemoglobin: 8.8 g/dL — ABNORMAL LOW (ref 12.0–15.0)
Hemoglobin: 9.2 g/dL — ABNORMAL LOW (ref 12.0–15.0)
MCH: 28.4 pg (ref 26.0–34.0)
MCH: 28.9 pg (ref 26.0–34.0)
MCH: 28.1 pg (ref 26.0–34.0)
MCHC: 33.8 g/dL (ref 30.0–36.0)
MCHC: 34.2 g/dL (ref 30.0–36.0)
MCHC: 33.3 g/dL (ref 30.0–36.0)
MCV: 84 fL (ref 78.0–100.0)
MCV: 84.3 fL (ref 78.0–100.0)
MCV: 84.4 fL (ref 78.0–100.0)
Platelets: 271 10*3/uL (ref 150–400)
Platelets: 261 10*3/uL (ref 150–400)
Platelets: 271 10*3/uL (ref 150–400)
RBC: 3.13 MIL/uL — ABNORMAL LOW (ref 3.87–5.11)
RBC: 3.05 MIL/uL — ABNORMAL LOW (ref 3.87–5.11)
RBC: 3.27 MIL/uL — ABNORMAL LOW (ref 3.87–5.11)
RDW: 15.2 % (ref 11.5–15.5)
RDW: 15.3 % (ref 11.5–15.5)
RDW: 15.3 % (ref 11.5–15.5)
WBC: 9.6 10*3/uL (ref 4.0–10.5)
WBC: 8.7 10*3/uL (ref 4.0–10.5)
WBC: 7.5 10*3/uL (ref 4.0–10.5)

## 2012-02-29 LAB — BASIC METABOLIC PANEL
BUN: 10 mg/dL (ref 6–23)
CO2: 21 mEq/L (ref 19–32)
Calcium: 8.4 mg/dL (ref 8.4–10.5)
Chloride: 107 mEq/L (ref 96–112)
Creatinine, Ser: 0.81 mg/dL (ref 0.50–1.10)
GFR calc Af Amer: 86 mL/min — ABNORMAL LOW (ref >90)
GFR calc non Af Amer: 75 mL/min — ABNORMAL LOW (ref >90)
Glucose, Bld: 151 mg/dL — ABNORMAL HIGH (ref 70–99)
Potassium: 4.1 mEq/L (ref 3.5–5.1)
Sodium: 139 mEq/L (ref 135–145)

## 2012-02-29 LAB — GLUCOSE, CAPILLARY
Glucose-Capillary: 142 mg/dL — ABNORMAL HIGH (ref 70–99)
Glucose-Capillary: 120 mg/dL — ABNORMAL HIGH (ref 70–99)

## 2012-02-29 LAB — PROTIME-INR
INR: 1.09 (ref 0.00–1.49)
Prothrombin Time: 14 seconds (ref 11.6–15.2)

## 2012-02-29 LAB — HEMOGLOBIN A1C
Hgb A1c MFr Bld: 6.2 % — ABNORMAL HIGH (ref <5.7)
Mean Plasma Glucose: 131 mg/dL — ABNORMAL HIGH (ref <117)

## 2012-02-29 LAB — TROPONIN I
Troponin I: <0.3 ng/mL (ref <0.30)
Troponin I: <0.3 ng/mL (ref <0.30)

## 2012-02-29 SURGERY — EGD (ESOPHAGOGASTRODUODENOSCOPY)
Anesthesia: Moderate Sedation

## 2012-02-29 MED ORDER — MIDAZOLAM HCL 10 MG/2ML IJ SOLN
INTRAMUSCULAR | Status: DC | PRN
  Administered 2012-02-29: 2 mg via INTRAVENOUS

## 2012-02-29 MED ORDER — PEG 3350-KCL-NA BICARB-NACL 420 G PO SOLR
4000.0000 mL | Freq: Once | ORAL | Status: AC
  Administered 2012-02-29: 4000 mL via ORAL
  Filled 2012-02-29 (×2): qty 4000

## 2012-02-29 MED ORDER — FENTANYL CITRATE 0.05 MG/ML IJ SOLN
INTRAMUSCULAR | Status: AC
  Filled 2012-02-29: qty 2

## 2012-02-29 MED ORDER — FENTANYL CITRATE 0.05 MG/ML IJ SOLN
INTRAMUSCULAR | Status: DC | PRN
  Administered 2012-02-29: 25 ug via INTRAVENOUS

## 2012-02-29 MED ORDER — SODIUM CHLORIDE 0.9 % IV SOLN: INTRAVENOUS | Status: DC

## 2012-02-29 MED ORDER — MIDAZOLAM HCL 5 MG/ML IJ SOLN
INTRAMUSCULAR | Status: AC
  Filled 2012-02-29: qty 2

## 2012-02-29 MED ORDER — BUTAMBEN-TETRACAINE-BENZOCAINE 2-2-14 % EX AERO
INHALATION_SPRAY | CUTANEOUS | Status: DC | PRN
  Administered 2012-02-29: 1 via TOPICAL

## 2012-02-29 NOTE — Progress Notes (Signed)
Pt admitted to 3304 from ED.  No distress noted, vital signs stable, no complaints.  1st unit of PRBC finishing.  Pt oriented to unit.  Bed alarm on.  Will continue to monitor.   Maximino Greenland RN

## 2012-02-29 NOTE — Op Note (Signed)
[facility] [address] [place], [address]   ENDOSCOPY PROCEDURE REPORT  PATIENT: Brittany, Ford  MR#: [id number] BIRTHDATE: 17-Nov-1945 , 66  yrs. old GENDER: Female ENDOSCOPIST:John Madilyn Fireman, MD REFERRED BY: PROCEDURE DATE:  02/29/2012 PROCEDURE: ASA CLASS: INDICATIONS:   GI bleedingMEDICATION:    25 mcg fentanyl, 3 mg Versed TOPICAL ANESTHETIC:  DESCRIPTION OF PROCEDURE:  esophagus, small hiatal hernia Stomach: Normal Duodenum: Normal      COMPLICATIONS: None  ENDOSCOPIC IMPRESSION:Small hiatal hernia, no source of upper GI bleeding seen.  RECOMMENDATIONS:Will review when last colonoscopy was and possibly set up for tomorrow or as an outpatient.    _______________________________ eSigned:  Dorena Cookey, MD 02/29/2012 1:11 PM

## 2012-02-29 NOTE — Consult Note (Signed)
[facility] Consult Note  Referring Provider: No ref. provider found Primary Care Physician:  [name], MD Primary Gastroenterologist:  Dr.  Antony Contras Complaint: Melena HPI: Brittany Ford is an 67 y.o. lack female  who presents with black stools and weakness with orthostasis with a hemoglobin drop from 11 as a baseline to 6 on admission. She states her stools have been darker than normal. She takes Xrealto or atrial fibrillation. She denies any abdominal pain. She does not recall having had EGD or colonoscopy in the past. She does not use nonsteroidal anti-inflammatory drugs. She has not had any vomiting  Past Medical History  Diagnosis Date  . Chest pain     with typical and atypical qualities  . GERD (gastroesophageal reflux disease)   . Chronic low back pain   . Obesity   . Atrial flutter   . Atrial fibrillation     Past Surgical History  Procedure Date  . Foot surgery     x2  . Tonsillectomy     Medications Prior to Admission  Medication Sig Dispense Refill  . metoprolol (LOPRESSOR) 50 MG tablet Take 50 mg by mouth 2 (two) times daily.      . Rivaroxaban (XARELTO) 20 MG TABS Take 20 mg by mouth daily.        Allergies: No Known Allergies  Family History  Problem Relation Age of Onset  . COPD Father   . Heart attack Father   . Lung cancer Father   . Breast cancer Mother   . Heart attack Brother     Social History:  reports that she quit smoking about 4 years ago. Her smoking use included Cigarettes. She has a 10 pack-year smoking history. She does not have any smokeless tobacco history on file. She reports that she does not drink alcohol. Her drug history not on file.  Review of Systems: negative except as above   Blood pressure 126/57, pulse 77, temperature 98 F (36.7 C), temperature source Oral, resp. rate 24, height 5\' 11"  (1.803 m), weight 100 kg (220 lb 7.4 oz), SpO2 97.00%. Head: Normocephalic, without obvious abnormality,  atraumatic Neck: no adenopathy, no carotid bruit, no JVD, supple, symmetrical, trachea midline and thyroid not enlarged, symmetric, no tenderness/mass/nodules Resp: clear to auscultation bilaterally Cardio: regular rate and rhythm, S1, S2 normal, no murmur, click, rub or gallop GI: Abdomen soft nondistended with normoactive bowel sounds. No hepatomegaly mass or guard Extremities: extremities normal, atraumatic, no cyanosis or edema  Results for orders placed during the hospital encounter of 02/28/12 (from the past 48 hour(s))  OCCULT BLOOD, POC DEVICE     Status: Abnormal   Collection Time   02/28/12  3:43 PM      Component Value Range Comment   Fecal Occult Bld POSITIVE (*) NEGATIVE   PRO B NATRIURETIC PEPTIDE     Status: Normal   Collection Time   02/28/12  3:47 PM      Component Value Range Comment   Pro B Natriuretic peptide (BNP) 75.4  0 - 125 pg/mL   TYPE AND SCREEN     Status: Normal   Collection Time   02/28/12  3:47 PM      Component Value Range Comment   ABO/RH(D) O POS      Antibody Screen NEG      Sample Expiration 03/02/2012      Unit Number [id number]      Blood Component Type RED CELLS,LR      Unit division 00  Status of Unit ISSUED,FINAL      Transfusion Status OK TO TRANSFUSE      Crossmatch Result Compatible      Unit Number [id number]      Blood Component Type RED CELLS,LR      Unit division 00      Status of Unit ISSUED,FINAL      Transfusion Status OK TO TRANSFUSE      Crossmatch Result Compatible     ABO/RH     Status: Normal   Collection Time   02/28/12  3:47 PM      Component Value Range Comment   ABO/RH(D) O POS     BASIC METABOLIC PANEL     Status: Abnormal   Collection Time   02/28/12  3:58 PM      Component Value Range Comment   Sodium 137  135 - 145 mEq/L    Potassium 3.7  3.5 - 5.1 mEq/L    Chloride 104  96 - 112 mEq/L    CO2 22  19 - 32 mEq/L    Glucose, Bld 180 (*) 70 - 99 mg/dL    BUN 13  6 - 23 mg/dL    Creatinine, Ser 1.30   0.50 - 1.10 mg/dL    Calcium 8.7  8.4 - 86.5 mg/dL    GFR calc non Af Amer 89 (*) >90 mL/min    GFR calc Af Amer >90  >90 mL/min   CBC     Status: Abnormal   Collection Time   02/28/12  3:58 PM      Component Value Range Comment   WBC 8.7  4.0 - 10.5 K/uL    RBC 2.37 (*) 3.87 - 5.11 MIL/uL    Hemoglobin 6.4 (*) 12.0 - 15.0 g/dL    HCT 78.4 (*) 69.6 - 46.0 %    MCV 84.4  78.0 - 100.0 fL    MCH 27.0  26.0 - 34.0 pg    MCHC 32.0  30.0 - 36.0 g/dL    RDW 29.5 (*) 28.4 - 15.5 %    Platelets 310  150 - 400 K/uL   POCT I-STAT TROPONIN I     Status: Normal   Collection Time   02/28/12  4:05 PM      Component Value Range Comment   Troponin i, poc 0.00  0.00 - 0.08 ng/mL    Comment 3            PREPARE RBC (CROSSMATCH)     Status: Normal   Collection Time   02/28/12  5:00 PM      Component Value Range Comment   Order Confirmation ORDER PROCESSED BY BLOOD BANK     GLUCOSE, CAPILLARY     Status: Abnormal   Collection Time   02/28/12  7:41 PM      Component Value Range Comment   Glucose-Capillary 107 (*) 70 - 99 mg/dL    Comment 1 Documented in Chart      Comment 2 Notify RN     MRSA PCR SCREENING     Status: Normal   Collection Time   02/28/12  9:26 PM      Component Value Range Comment   MRSA by PCR NEGATIVE  NEGATIVE   CBC     Status: Abnormal   Collection Time   02/29/12 12:43 AM      Component Value Range Comment   WBC 9.6  4.0 - 10.5 K/uL    RBC 3.13 (*) 3.87 -  5.11 MIL/uL    Hemoglobin 8.9 (*) 12.0 - 15.0 g/dL    HCT 29.5 (*) 62.1 - 46.0 %    MCV 84.0  78.0 - 100.0 fL    MCH 28.4  26.0 - 34.0 pg    MCHC 33.8  30.0 - 36.0 g/dL    RDW 30.8  65.7 - 84.6 %    Platelets 271  150 - 400 K/uL   PROTIME-INR     Status: Normal   Collection Time   02/29/12 12:43 AM      Component Value Range Comment   Prothrombin Time 14.0  11.6 - 15.2 seconds    INR 1.09  0.00 - 1.49   TROPONIN I     Status: Normal   Collection Time   02/29/12 12:43 AM      Component Value Range Comment    Troponin I <0.30  <0.30 ng/mL   CBC     Status: Abnormal   Collection Time   02/29/12  7:45 AM      Component Value Range Comment   WBC 8.7  4.0 - 10.5 K/uL    RBC 3.05 (*) 3.87 - 5.11 MIL/uL    Hemoglobin 8.8 (*) 12.0 - 15.0 g/dL    HCT 96.2 (*) 95.2 - 46.0 %    MCV 84.3  78.0 - 100.0 fL    MCH 28.9  26.0 - 34.0 pg    MCHC 34.2  30.0 - 36.0 g/dL    RDW 84.1  32.4 - 40.1 %    Platelets 261  150 - 400 K/uL   BASIC METABOLIC PANEL     Status: Abnormal   Collection Time   02/29/12  7:45 AM      Component Value Range Comment   Sodium 139  135 - 145 mEq/L    Potassium 4.1  3.5 - 5.1 mEq/L    Chloride 107  96 - 112 mEq/L    CO2 21  19 - 32 mEq/L    Glucose, Bld 151 (*) 70 - 99 mg/dL    BUN 10  6 - 23 mg/dL    Creatinine, Ser 0.27  0.50 - 1.10 mg/dL    Calcium 8.4  8.4 - 25.3 mg/dL    GFR calc non Af Amer 75 (*) >90 mL/min    GFR calc Af Amer 86 (*) >90 mL/min   TROPONIN I     Status: Normal   Collection Time   02/29/12  7:45 AM      Component Value Range Comment   Troponin I <0.30  <0.30 ng/mL   GLUCOSE, CAPILLARY     Status: Abnormal   Collection Time   02/29/12  8:35 AM      Component Value Range Comment   Glucose-Capillary 142 (*) 70 - 99 mg/dL    Comment 1 Documented in Chart      Dg Chest Port 1 View  02/28/2012  *RADIOLOGY REPORT*  Clinical Data: Shortness of breath and dizziness.  PORTABLE CHEST - 1 VIEW  Comparison: PA and lateral chest 01/14/2012 and 08/13/2010.  Findings: Lungs are clear.  Heart size is normal.  No pneumothorax or pleural effusion.  IMPRESSION: No acute disease.   Original Report Authenticated By: Holley Dexter, M.D.     Assessment: Apparent upper GI bleed though with normal BUN, in a patient on chronic anticoagulation presenting with melena. Plan:  IV proton pump inhibitor and will eat with EGD later today HAYES,JOHN C 02/29/2012, 9:17 AM

## 2012-02-29 NOTE — Progress Notes (Signed)
Utilization review completed.  

## 2012-02-29 NOTE — Progress Notes (Signed)
Refused to have blood drawn.  Explained to patient the benefits of having blood drawn, but still refuse.

## 2012-02-29 NOTE — Progress Notes (Signed)
[facility] Progress Note [place] TEAM 1 - Stepdown/ICU TEAM   Sheree Lalla ZOX:[id number] DOB: [birth year] DOA: 02/28/2012 PCP: [name], MD  Brief narrative: 67 year old female patient with underlying atrial fib and flutter on chronic anticoagulation since December 2013. Presents with clinical signs consistent with GI bleeding/heme positive stool as well as a significant drop in her hemoglobin from 11 to 6. She has developed lightheadedness and dizziness over the past 3 days with standing consistent with orthostasis and also has developed pounding headaches. She has generalized weakness, dyspnea but denied chest pain tightness or pressure. Endorsed stools darker than normal with black type melena for 3 days. Also mild anorexia without abdominal pain. In the emergency department she was orthostatic and as noted heme positive. Gastroenterology was consulted the patient was in the emergency department and she was subsequently admitted to the step down unit care  Assessment/Plan: Principal Problem:  *GI bleed -Had black stool this morning -Appreciate gastroenterology assistance -EGD today demonstrated no upper GI source for bleeding for colonoscopy planned for 03/01/2012 -Possibly can discontinue Protonix infusion  Active Problems:  Anemia associated with acute blood loss -Hemoglobin has increased to 8.8 from a nadir of 6.4, baseline hemoglobin noted to be around 11-serial CBC q 12 hrs -Has received 2 units of packed red blood cells this admission   Diabetes mellitus -Hemoglobin A1c 6.3 -Continue sliding scale insulin -According to the medication reconciliation sheet patient was not on diabetic medications prior to admission   Hyperlipidemia -Not on medications prior to admission   PAF (paroxysmal atrial fibrillation)/atrial flutter -Continue to hold Xarelto -Currently rate controlled -Continue home metoprolol   DVT prophylaxis: SCDs Code Status:  Full Family Communication: Spoke with patient Disposition Plan: Remain in step down  Consultants: Gastroenterology  Procedures: EGD-(02/29/12): ENDOSCOPIC IMPRESSION:Small hiatal hernia, no source of upper GI  bleeding seen.  RECOMMENDATIONS:Will review when last colonoscopy was and possibly  set up for tomorrow or as an outpatient.    Antibiotics: None  HPI/Subjective: Patient without specific complaints this morning. Denies shortness of breath or chest pain. States dizziness improved after blood transfusion. No abdominal pain.   Objective: Blood pressure 144/76, pulse 55, temperature 98.5 F (36.9 C), temperature source Oral, resp. rate 18, height 5\' 11"  (1.803 m), weight 100 kg (220 lb 7.4 oz), SpO2 100.00%.  Intake/Output Summary (Last 24 hours) at 02/29/12 1304 Last data filed at 02/29/12 1100  Gross per 24 hour  Intake 1798.33 ml  Output      0 ml  Net 1798.33 ml     Exam: General: No acute respiratory distress Lungs: Clear to auscultation bilaterally without wheezes or crackles Cardiovascular: Regular rate and rhythm without murmur gallop or rub normal S1 and S2 Abdomen: Nontender, nondistended, soft, bowel sounds positive, no rebound, no ascites, no appreciable mass Extremities: No significant cyanosis, clubbing, or edema bilateral lower extremities  Data Reviewed: Basic Metabolic Panel:  Lab 02/29/12 8119 02/28/12 1558  NA 139 137  K 4.1 3.7  CL 107 104  CO2 21 22  GLUCOSE 151* 180*  BUN 10 13  CREATININE 0.81 0.70  CALCIUM 8.4 8.7  MG -- --  PHOS -- --   Liver Function Tests: No results found for this basename: AST:5,ALT:5,ALKPHOS:5,BILITOT:5,PROT:5,ALBUMIN:5 in the last 168 hours No results found for this basename: LIPASE:5,AMYLASE:5 in the last 168 hours No results found for this basename: AMMONIA:5 in the last 168 hours CBC:  Lab 02/29/12 0745 02/29/12 0043 02/28/12 1558  WBC 8.7 9.6 8.7  NEUTROABS -- -- --  HGB 8.8* 8.9* 6.4*  HCT 25.7*  26.3* 20.0*  MCV 84.3 84.0 84.4  PLT 261 271 310   Cardiac Enzymes:  Lab 02/29/12 0745 02/29/12 0043  CKTOTAL -- --  CKMB -- --  CKMBINDEX -- --  TROPONINI <0.30 <0.30   BNP (last 3 results)  Basename 02/28/12 1547  PROBNP 75.4   CBG:  Lab 02/29/12 0835 02/28/12 1941  GLUCAP 142* 107*    Recent Results (from the past 240 hour(s))  MRSA PCR SCREENING     Status: Normal   Collection Time   02/28/12  9:26 PM      Component Value Range Status Comment   MRSA by PCR NEGATIVE  NEGATIVE Final      Studies:  Recent x-ray studies have been reviewed in detail by the Attending Physician  Scheduled Meds:  Reviewed in detail by the Attending Physician   Junious Silk, ANP [facility] Office  [phone] Pager [phone]  On-Call/Text Page:      [email or web address]      password [email or web address]  If 7PM-7AM, please contact night-coverage [email or web address] Password [facility] 02/29/2012, 1:04 PM   LOS: 1 day   I have examined the patient and reviewed the chart. I agree with the above note which I have modified. Saima Rizwan,MD [phone]

## 2012-03-01 ENCOUNTER — Encounter (HOSPITAL_COMMUNITY)
Admission: EM | Disposition: A | Discharge status: Home / Self Care | Payer: Self-pay | Source: Home / Self Care | Attending: Internal Medicine

## 2012-03-01 ENCOUNTER — Encounter (HOSPITAL_COMMUNITY): Payer: Self-pay | Admitting: Gastroenterology

## 2012-03-01 DIAGNOSIS — D62 Acute posthemorrhagic anemia: Secondary | ICD-10-CM

## 2012-03-01 HISTORY — PX: COLONOSCOPY: SHX5424

## 2012-03-01 LAB — BASIC METABOLIC PANEL
BUN: 8 mg/dL (ref 6–23)
CO2: 21 mEq/L (ref 19–32)
Calcium: 8.5 mg/dL (ref 8.4–10.5)
Chloride: 109 mEq/L (ref 96–112)
Creatinine, Ser: 0.89 mg/dL (ref 0.50–1.10)
GFR calc Af Amer: 77 mL/min — ABNORMAL LOW (ref >90)
GFR calc non Af Amer: 67 mL/min — ABNORMAL LOW (ref >90)
Glucose, Bld: 133 mg/dL — ABNORMAL HIGH (ref 70–99)
Potassium: 3.8 mEq/L (ref 3.5–5.1)
Sodium: 141 mEq/L (ref 135–145)

## 2012-03-01 LAB — CBC
HCT: 26.2 % — ABNORMAL LOW (ref 36.0–46.0)
Hemoglobin: 8.6 g/dL — ABNORMAL LOW (ref 12.0–15.0)
MCH: 27.7 pg (ref 26.0–34.0)
MCHC: 32.8 g/dL (ref 30.0–36.0)
MCV: 84.5 fL (ref 78.0–100.0)
Platelets: 269 10*3/uL (ref 150–400)
RBC: 3.1 MIL/uL — ABNORMAL LOW (ref 3.87–5.11)
RDW: 15.6 % — ABNORMAL HIGH (ref 11.5–15.5)
WBC: 6.9 10*3/uL (ref 4.0–10.5)

## 2012-03-01 LAB — GLUCOSE, CAPILLARY
Glucose-Capillary: 94 mg/dL (ref 70–99)
Glucose-Capillary: 113 mg/dL — ABNORMAL HIGH (ref 70–99)
Glucose-Capillary: 101 mg/dL — ABNORMAL HIGH (ref 70–99)
Glucose-Capillary: 137 mg/dL — ABNORMAL HIGH (ref 70–99)
Glucose-Capillary: 128 mg/dL — ABNORMAL HIGH (ref 70–99)

## 2012-03-01 SURGERY — IMAGING PROCEDURE, GI TRACT, INTRALUMINAL, VIA CAPSULE
Anesthesia: LOCAL

## 2012-03-01 SURGERY — COLONOSCOPY
Anesthesia: Moderate Sedation

## 2012-03-01 MED ORDER — MIDAZOLAM HCL 10 MG/2ML IJ SOLN
INTRAMUSCULAR | Status: DC | PRN
  Administered 2012-03-01: 1 mg via INTRAVENOUS
  Administered 2012-03-01 (×2): 2 mg via INTRAVENOUS
  Administered 2012-03-01: 1 mg via INTRAVENOUS

## 2012-03-01 MED ORDER — FENTANYL CITRATE 0.05 MG/ML IJ SOLN
INTRAMUSCULAR | Status: DC | PRN
  Administered 2012-03-01 (×3): 25 ug via INTRAVENOUS

## 2012-03-01 MED ORDER — LEVALBUTEROL HCL 0.63 MG/3ML IN NEBU
0.6300 mg | INHALATION_SOLUTION | RESPIRATORY_TRACT | Status: DC | PRN
  Filled 2012-03-01: qty 3

## 2012-03-01 MED ORDER — FENTANYL CITRATE 0.05 MG/ML IJ SOLN
INTRAMUSCULAR | Status: AC
  Filled 2012-03-01: qty 4

## 2012-03-01 MED ORDER — MIDAZOLAM HCL 5 MG/ML IJ SOLN
INTRAMUSCULAR | Status: AC
  Filled 2012-03-01: qty 3

## 2012-03-01 MED ORDER — DIPHENHYDRAMINE HCL 50 MG/ML IJ SOLN
INTRAMUSCULAR | Status: AC
  Filled 2012-03-01: qty 1

## 2012-03-01 NOTE — Progress Notes (Signed)
[facility] Progress Note [place] TEAM 1 - Stepdown/ICU TEAM   Brittany Ford AVW:[id number] DOB: 06-11-1945 DOA: 02/28/2012 PCP: [name], MD  Brief narrative: 67 year old female patient with underlying atrial fib and flutter on chronic anticoagulation since December 2013. Presented with clinical signs consistent with GI bleeding/heme positive stool as well as a significant drop in her hemoglobin from 11 to 6. She developed lightheadedness and dizziness over 3 days with standing consistent with orthostasis and also developed pounding headaches. She had generalized weakness, dyspnea but denied chest pain tightness or pressure. Endorsed stools darker than normal with black type melena for 3 days. Also mild anorexia without abdominal pain. In the emergency department she was orthostatic and was noted heme positive. Gastroenterology was consulted while the patient was in the emergency department and she was subsequently admitted to the step down unit care  Assessment/Plan:  GI bleed -Appreciate gastroenterology assistance -EGD/Colonoscopy negative for bleeding source -Capsule endo in process  Anemia associated with acute blood loss -Hemoglobin nadir of 6.4, baseline hemoglobin 11 -Has received 2 units of packed red blood cells this admission - hgb 8.6 -CBC in am  Diabetes mellitus - diet controlled -Hemoglobin A1c 6.3 -Continue sliding scale insulin -According to the medication reconciliation sheet patient was not on diabetic medications prior to admission   Hyperlipidemia -Not on medications prior to admission   PAF (paroxysmal atrial fibrillation)/atrial flutter -Continue to hold Xarelto until source of bleeding determined -Currently rate controlled -Continue metoprolol   DVT prophylaxis: SCDs Code Status: Full Family Communication: Spoke with patient Disposition Plan:  Telemetry  Consultants: Gastroenterology  Procedures: EGD-(02/29/12): ENDOSCOPIC IMPRESSION:Small hiatal hernia, no source of upper GI  bleeding seen.  RECOMMENDATIONS:Will review when last colonoscopy was and possibly  set up for tomorrow or as an outpatient.   Colonoscopy (03/01/12): ENDOSCOPIC IMPRESSION:left-sided diverticulosis no other source of  bleeding  RECOMMENDATIONS:proceed with capsule endoscopy.  Antibiotics: None  HPI/Subjective: Patient examined in endoscopy unit. Primary complaint is of hunger and when can resume solid diet. Still having difficulty remembering rationale for tests. Also explained the rationale for anticoagulation because of atrial fibrillation; also discussed risks and benefits of withholding anticoagulation if unable to resume because of bleeding.   Objective: Blood pressure 122/52, pulse 67, temperature 98 F (36.7 C), temperature source Oral, resp. rate 15, height 5\' 11"  (1.803 m), weight 100 kg (220 lb 7.4 oz), SpO2 95.00%.  Intake/Output Summary (Last 24 hours) at 03/01/12 1414 Last data filed at 03/01/12 1400  Gross per 24 hour  Intake   1575 ml  Output      0 ml  Net   1575 ml     Exam: General: No acute respiratory distress Lungs: Clear to auscultation bilaterally without wheezes or crackles Cardiovascular: Regular rate and rhythm without murmur gallop or rub normal S1 and S2 Abdomen: Nontender, nondistended, soft, bowel sounds positive, no rebound, no ascites, no appreciable mass Extremities: No significant cyanosis, clubbing, or edema bilateral lower extremities  Data Reviewed: Basic Metabolic Panel:  Lab 03/01/12 8295 02/29/12 0745 02/28/12 1558  NA 141 139 137  K 3.8 4.1 3.7  CL 109 107 104  CO2 21 21 22   GLUCOSE 133* 151* 180*  BUN 8 10 13   CREATININE 0.89 0.81 0.70  CALCIUM 8.5 8.4 8.7  MG -- -- --  PHOS -- -- --   CBC:  Lab 03/01/12 0600 02/29/12 1525 02/29/12 0745 02/29/12 0043 02/28/12 1558  WBC 6.9 7.5 8.7  9.6 8.7  NEUTROABS -- -- -- -- --  HGB 8.6* 9.2* 8.8* 8.9* 6.4*  HCT 26.2* 27.6* 25.7* 26.3* 20.0*  MCV 84.5 84.4 84.3 84.0 84.4  PLT 269 271 261 271 310   Cardiac Enzymes:  Lab 02/29/12 0745 02/29/12 0043  CKTOTAL -- --  CKMB -- --  CKMBINDEX -- --  TROPONINI <0.30 <0.30   BNP (last 3 results)  Basename 02/28/12 1547  PROBNP 75.4   CBG:  Lab 03/01/12 0803 02/29/12 2153 02/29/12 1727 02/29/12 0835 02/28/12 1941  GLUCAP 113* 94 120* 142* 107*    Recent Results (from the past 240 hour(s))  MRSA PCR SCREENING     Status: Normal   Collection Time   02/28/12  9:26 PM      Component Value Range Status Comment   MRSA by PCR NEGATIVE  NEGATIVE Final      Studies:  Recent x-ray studies have been reviewed in detail by the Attending Physician  Scheduled Meds:  Reviewed in detail by the Attending Physician   Junious Silk, ANP [facility] Office  [phone] Pager [phone]  On-Call/Text Page:      [email or web address]      password [email or web address]  If 7PM-7AM, please contact night-coverage [email or web address] Password [email or web address] 03/01/2012, 2:14 PM   LOS: 2 days   I have personally examined this patient and reviewed the entire database. I have reviewed the above note, made any necessary editorial changes, and agree with its content.  Lonia Blood, MD [facility]

## 2012-03-01 NOTE — Progress Notes (Signed)
Report called to [facility], receiving RN on  [phone]. VSS. Transferred to [phone] via wheelchair with personal belongings. Called patient's family member with new room number.  [facility]

## 2012-03-01 NOTE — Progress Notes (Signed)
[facility] Progress Note  Subjective: Patient tolerated bowel prep  Objective: Vital signs in last 24 hours: Temp:  [97.7 F (36.5 C)-98.9 F (37.2 C)] 98.9 F (37.2 C) (01/24 0843) Pulse Rate:  [55-76] 65  (01/24 0842) Resp:  [13-22] 21  (01/24 0959) BP: (97-155)/(25-76) 123/44 mmHg (01/24 0959) SpO2:  [97 %-100 %] 98 % (01/24 0959) Weight change:    PE: Abdomen soft  Lab Results: Results for orders placed during the hospital encounter of 02/28/12 (from the past 24 hour(s))  CBC     Status: Abnormal   Collection Time   02/29/12  3:25 PM      Component Value Range   WBC 7.5  4.0 - 10.5 K/uL   RBC 3.27 (*) 3.87 - 5.11 MIL/uL   Hemoglobin 9.2 (*) 12.0 - 15.0 g/dL   HCT 16.1 (*) 09.6 - 04.5 %   MCV 84.4  78.0 - 100.0 fL   MCH 28.1  26.0 - 34.0 pg   MCHC 33.3  30.0 - 36.0 g/dL   RDW 40.9  81.1 - 91.4 %   Platelets 271  150 - 400 K/uL  GLUCOSE, CAPILLARY     Status: Abnormal   Collection Time   02/29/12  5:27 PM      Component Value Range   Glucose-Capillary 120 (*) 70 - 99 mg/dL   Comment 1 Documented in Chart    GLUCOSE, CAPILLARY     Status: Normal   Collection Time   02/29/12  9:53 PM      Component Value Range   Glucose-Capillary 94  70 - 99 mg/dL   Comment 1 Notify RN     Comment 2 Documented in Chart    CBC     Status: Abnormal   Collection Time   03/01/12  6:00 AM      Component Value Range   WBC 6.9  4.0 - 10.5 K/uL   RBC 3.10 (*) 3.87 - 5.11 MIL/uL   Hemoglobin 8.6 (*) 12.0 - 15.0 g/dL   HCT 78.2 (*) 95.6 - 21.3 %   MCV 84.5  78.0 - 100.0 fL   MCH 27.7  26.0 - 34.0 pg   MCHC 32.8  30.0 - 36.0 g/dL   RDW 08.6 (*) 57.8 - 46.9 %   Platelets 269  150 - 400 K/uL  BASIC METABOLIC PANEL     Status: Abnormal   Collection Time   03/01/12  6:00 AM      Component Value Range   Sodium 141  135 - 145 mEq/L   Potassium 3.8  3.5 - 5.1 mEq/L   Chloride 109  96 - 112 mEq/L   CO2 21  19 - 32 mEq/L   Glucose, Bld 133 (*) 70 - 99 mg/dL   BUN 8  6 - 23  mg/dL   Creatinine, Ser 6.29  0.50 - 1.10 mg/dL   Calcium 8.5  8.4 - 52.8 mg/dL   GFR calc non Af Amer 67 (*) >90 mL/min   GFR calc Af Amer 77 (*) >90 mL/min  GLUCOSE, CAPILLARY     Status: Abnormal   Collection Time   03/01/12  8:03 AM      Component Value Range   Glucose-Capillary 113 (*) 70 - 99 mg/dL   Comment 1 Documented in Chart     Comment 2 Notify RN      Studies/Results: Dg Chest Port 1 View  02/28/2012  *RADIOLOGY REPORT*  Clinical Data: Shortness of breath and dizziness.  PORTABLE CHEST - 1 VIEW  Comparison: PA and lateral chest 01/14/2012 and 08/13/2010.  Findings: Lungs are clear.  Heart size is normal.  No pneumothorax or pleural effusion.  IMPRESSION: No acute disease.   Original Report Authenticated By: Holley Dexter, M.D.    Colonoscopy: Left-sided diverticulosis otherwise normal study   Assessment: Severe anemia with heme positive stool, no obvious source by upper or lower endoscopy.  Plan: Proceed with capsule endoscopy.    HAYES,JOHN C 03/01/2012, 10:02 AM

## 2012-03-01 NOTE — Op Note (Signed)
Brittany Ford [facility] [address] [place], [address]   COLONOSCOPY PROCEDURE REPORT  PATIENT: Brittany Ford, Brittany Ford  MR#: [id number] BIRTHDATE: [birth year] , 66  yrs. old GENDER: Female ENDOSCOPIST: Dorena Cookey, MD REFERRED BY: PROCEDURE DATE:  03/01/2012 PROCEDURE: ASA CLASS: INDICATIONS:  anemia and heme positive stool MEDICATIONS:   fentanyl 75 mcg, Versed 6 mg  DESCRIPTION OF PROCEDURE: excellent prep to the cecum. Ileocecal valve and appendiceal orifice identified. Several scattered descending and sigmoid diverticula otherwise normal study     COMPLICATIONS: None  ENDOSCOPIC IMPRESSION:left-sided diverticulosis no other source of bleeding  RECOMMENDATIONS:we'll proceed with capsule endoscopy.    _______________________________ Brittany DoctorDorena Cookey, MD 03/01/2012 10:09 AM

## 2012-03-02 LAB — BASIC METABOLIC PANEL
BUN: 9 mg/dL (ref 6–23)
CO2: 18 mEq/L — ABNORMAL LOW (ref 19–32)
Calcium: 8.4 mg/dL (ref 8.4–10.5)
Chloride: 108 mEq/L (ref 96–112)
Creatinine, Ser: 0.8 mg/dL (ref 0.50–1.10)
GFR calc Af Amer: 88 mL/min — ABNORMAL LOW (ref >90)
GFR calc non Af Amer: 76 mL/min — ABNORMAL LOW (ref >90)
Glucose, Bld: 140 mg/dL — ABNORMAL HIGH (ref 70–99)
Potassium: 3.7 mEq/L (ref 3.5–5.1)
Sodium: 139 mEq/L (ref 135–145)

## 2012-03-02 LAB — CBC
HCT: 26.4 % — ABNORMAL LOW (ref 36.0–46.0)
Hemoglobin: 8.7 g/dL — ABNORMAL LOW (ref 12.0–15.0)
MCH: 28 pg (ref 26.0–34.0)
MCHC: 33 g/dL (ref 30.0–36.0)
MCV: 84.9 fL (ref 78.0–100.0)
Platelets: 283 10*3/uL (ref 150–400)
RBC: 3.11 MIL/uL — ABNORMAL LOW (ref 3.87–5.11)
RDW: 15.8 % — ABNORMAL HIGH (ref 11.5–15.5)
WBC: 6.6 10*3/uL (ref 4.0–10.5)

## 2012-03-02 LAB — GLUCOSE, CAPILLARY
Glucose-Capillary: 124 mg/dL — ABNORMAL HIGH (ref 70–99)
Glucose-Capillary: 135 mg/dL — ABNORMAL HIGH (ref 70–99)
Glucose-Capillary: 107 mg/dL — ABNORMAL HIGH (ref 70–99)
Glucose-Capillary: 134 mg/dL — ABNORMAL HIGH (ref 70–99)

## 2012-03-02 NOTE — Progress Notes (Signed)
Patient ID: Brittany Ford, female   DOB: [birth year], 67 y.o.   MRN: [id number]  Feels ok. Tolerating diet. Capsule study completed yesterday. Should have results this afternoon.  Hgb stable at 8.7. 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

## 2012-03-02 NOTE — Progress Notes (Signed)
[facility] PROGRESS NOTE  Brittany Ford ZOX:[id number] DOB: [birth year] DOA: 02/28/2012 PCP: [name], MD  Assessment/Plan: Principal Problem:  *GI bleed Active Problems:  Hyperlipidemia  Diabetes mellitus  PAF (paroxysmal atrial fibrillation)/atrial flutter  Anemia associated with acute blood loss    Brief narrative:  67 year old female patient with underlying atrial fib and flutter on chronic anticoagulation since December 2013. Presented with clinical signs consistent with GI bleeding/heme positive stool as well as a significant drop in her hemoglobin from 11 to 6. She developed lightheadedness and dizziness over 3 days with standing consistent with orthostasis and also developed pounding headaches. She had generalized weakness, dyspnea but denied chest pain tightness or pressure. Endorsed stools darker than normal with black type melena for 3 days. Also mild anorexia without abdominal pain. In the emergency department she was orthostatic and was noted heme positive. Gastroenterology was consulted while the patient was in the emergency department and she was subsequently admitted to the step down unit care  Assessment/Plan:  GI bleed  -Appreciate gastroenterology assistance  -EGD/Colonoscopy negative for bleeding source  -Capsule endo in process  Anemia associated with acute blood loss  -Hemoglobin nadir of 6.4, baseline hemoglobin 11  -Has received 2 units of packed red blood cells this admission - hgb 8.7  -CBC in am  Diabetes mellitus - diet controlled  -Hemoglobin A1c 6.3  -Continue sliding scale insulin  -According to the medication reconciliation sheet patient was not on diabetic medications prior to admission  Hyperlipidemia  -Not on medications prior to admission  PAF (paroxysmal atrial fibrillation)/atrial flutter  -Continue to hold Xarelto until source of bleeding determined  -Currently rate controlled  -Continue metoprolol  DVT  prophylaxis: SCDs  Code Status: Full  Family Communication: Spoke with patient  Disposition Plan: Telemetry  Consultants:  Gastroenterology    Procedures:  EGD-(02/29/12):  ENDOSCOPIC IMPRESSION:Small hiatal hernia, no source of upper GI  bleeding seen.  RECOMMENDATIONS:Will review when last colonoscopy was and possibly  set up for tomorrow or as an outpatient.  Colonoscopy (03/01/12):  ENDOSCOPIC IMPRESSION:left-sided diverticulosis no other source of  bleeding  RECOMMENDATIONS:proceed with capsule endoscopy.  Antibiotics:  None   history of present illness Denies any nausea vomiting abdominal pain No further episodes of melena  Objective: Filed Vitals:   03/01/12 1220 03/01/12 1617 03/01/12 2040 03/02/12 0442  BP: 122/52 139/53 138/49 130/58  Pulse: 67  75 64  Temp: 98 F (36.7 C)  97.7 F (36.5 C) 98.6 F (37 C)  TempSrc: Oral  Oral Oral  Resp: 15 18 18 18   Height:      Weight:      SpO2: 95% 100% 100% 97%    Intake/Output Summary (Last 24 hours) at 03/02/12 0855 Last data filed at 03/01/12 1700  Gross per 24 hour  Intake    540 ml  Output      0 ml  Net    540 ml    Exam:  HENT:  Head: Atraumatic.  Nose: Nose normal.  Mouth/Throat: Oropharynx is clear and moist.  Eyes: Conjunctivae are normal. Pupils are equal, round, and reactive to light. No scleral icterus.  Neck: Neck supple. No tracheal deviation present.  Cardiovascular: Normal rate, regular rhythm, normal heart sounds and intact distal pulses.  Pulmonary/Chest: Effort normal and breath sounds normal. No respiratory distress.  Abdominal: Soft. Normal appearance and bowel sounds are normal. She exhibits no distension. There is no tenderness.  Musculoskeletal: She exhibits no edema and no tenderness.  Neurological: She  is alert. No cranial nerve deficit.    Data Reviewed: Basic Metabolic Panel:  Lab 03/02/12 1610 03/01/12 0600 02/29/12 0745 02/28/12 1558  NA 139 141 139 137  K 3.7 3.8 4.1 3.7   CL 108 109 107 104  CO2 18* 21 21 22   GLUCOSE 140* 133* 151* 180*  BUN 9 8 10 13   CREATININE 0.80 0.89 0.81 0.70  CALCIUM 8.4 8.5 8.4 8.7  MG -- -- -- --  PHOS -- -- -- --    Liver Function Tests: No results found for this basename: AST:5,ALT:5,ALKPHOS:5,BILITOT:5,PROT:5,ALBUMIN:5 in the last 168 hours No results found for this basename: LIPASE:5,AMYLASE:5 in the last 168 hours No results found for this basename: AMMONIA:5 in the last 168 hours  CBC:  Lab 03/02/12 0610 03/01/12 0600 02/29/12 1525 02/29/12 0745 02/29/12 0043  WBC 6.6 6.9 7.5 8.7 9.6  NEUTROABS -- -- -- -- --  HGB 8.7* 8.6* 9.2* 8.8* 8.9*  HCT 26.4* 26.2* 27.6* 25.7* 26.3*  MCV 84.9 84.5 84.4 84.3 84.0  PLT 283 269 271 261 271    Cardiac Enzymes:  Lab 02/29/12 0745 02/29/12 0043  CKTOTAL -- --  CKMB -- --  CKMBINDEX -- --  TROPONINI <0.30 <0.30   BNP (last 3 results)  Basename 02/28/12 1547  PROBNP 75.4     CBG:  Lab 03/02/12 0734 03/01/12 2039 03/01/12 1622 03/01/12 1204 03/01/12 0803  GLUCAP 124* 128* 137* 101* 113*    Recent Results (from the past 240 hour(s))  MRSA PCR SCREENING     Status: Normal   Collection Time   02/28/12  9:26 PM      Component Value Range Status Comment   MRSA by PCR NEGATIVE  NEGATIVE Final      Studies: Dg Chest Port 1 View  02/28/2012  *RADIOLOGY REPORT*  Clinical Data: Shortness of breath and dizziness.  PORTABLE CHEST - 1 VIEW  Comparison: PA and lateral chest 01/14/2012 and 08/13/2010.  Findings: Lungs are clear.  Heart size is normal.  No pneumothorax or pleural effusion.  IMPRESSION: No acute disease.   Original Report Authenticated By: Holley Dexter, M.D.     Scheduled Meds:   . insulin aspart  0-9 Units Subcutaneous TID WC  . metoprolol  50 mg Oral BID  . sodium chloride  3 mL Intravenous Q12H   Continuous Infusions:   . sodium chloride 50 mL/hr at 03/01/12 1830    Principal Problem:  *GI bleed Active Problems:  Hyperlipidemia  Diabetes  mellitus  PAF (paroxysmal atrial fibrillation)/atrial flutter  Anemia associated with acute blood loss    Time spent: 40 minutes   [facility]  [facility] Pager [phone]. If 8PM-8AM, please contact night-coverage at [email or web address], password [facility] 03/02/2012, 8:55 AM  LOS: 3 days

## 2012-03-02 NOTE — Progress Notes (Addendum)
Patient ID: Brittany Ford, female   DOB: 1945/06/20, 67 y.o.   MRN: [id number]  Capsule endoscopy normal without any bleeding, blood products, or abnormalities noted. She may have had mucosal bleeding on the anticoagulation (prior to admit) that has since resolved. Ok to resume Xarelto from GI standpoint but defer timing of starting it to primary team. Needs hematology evaluation (as outpt). Follow Hgb and ok to d/c from GI standpoint tomorrow if stable. F/U with me in 3-4 weeks. Patient aware of results. Defer to primary team talking with cards about Xarelto because patient states she would rather NOT be on that if that is increasing her risk of bleeding. Advised her that she and her cardiologist need to discuss that medicine but she likely will need to be back on it.

## 2012-03-03 LAB — CBC
HCT: 26.1 % — ABNORMAL LOW (ref 36.0–46.0)
Hemoglobin: 8.6 g/dL — ABNORMAL LOW (ref 12.0–15.0)
MCH: 28.1 pg (ref 26.0–34.0)
MCHC: 33 g/dL (ref 30.0–36.0)
MCV: 85.3 fL (ref 78.0–100.0)
Platelets: 271 10*3/uL (ref 150–400)
RBC: 3.06 MIL/uL — ABNORMAL LOW (ref 3.87–5.11)
RDW: 15.3 % (ref 11.5–15.5)
WBC: 7.4 10*3/uL (ref 4.0–10.5)

## 2012-03-03 LAB — GLUCOSE, CAPILLARY: Glucose-Capillary: 126 mg/dL — ABNORMAL HIGH (ref 70–99)

## 2012-03-03 NOTE — Discharge Summary (Signed)
Physician Discharge Summary  Brittany Ford MRN: [id number] DOB/AGE: 1945/10/28 67 y.o.  PCP: Brittany Alken, MD   Admit date: 02/28/2012 Discharge date: 03/03/2012  Discharge Diagnoses:     *GI bleed Active Problems:  Hyperlipidemia  Diabetes mellitus  PAF (paroxysmal atrial fibrillation)/atrial flutter  Anemia associated with acute blood loss     Medication List     As of 03/03/2012  8:33 AM    STOP taking these medications         XARELTO 20 MG Tabs   Generic drug: Rivaroxaban      TAKE these medications         metoprolol 50 MG tablet   Commonly known as: LOPRESSOR   Take 50 mg by mouth 2 (two) times daily.        Discharge Condition: Stable Disposition: 01-Home or Self Care   Consults:  GI  Significant Diagnostic Studies: Dg Chest Port 1 View  02/28/2012  *RADIOLOGY REPORT*  Clinical Data: Shortness of breath and dizziness.  PORTABLE CHEST - 1 VIEW  Comparison: PA and lateral chest 01/14/2012 and 08/13/2010.  Findings: Lungs are clear.  Heart size is normal.  No pneumothorax or pleural effusion.  IMPRESSION: No acute disease.   Original Report Authenticated By: Brittany Ford, M.D.       Microbiology: Recent Results (from the past 240 hour(s))  MRSA PCR SCREENING     Status: Normal   Collection Time   02/28/12  9:26 PM      Component Value Range Status Comment   MRSA by PCR NEGATIVE  NEGATIVE Final      Labs: Results for orders placed during the hospital encounter of 02/28/12 (from the past 48 hour(s))  GLUCOSE, CAPILLARY     Status: Abnormal   Collection Time   03/01/12 12:04 PM      Component Value Range Comment   Glucose-Capillary 101 (*) 70 - 99 mg/dL    Comment 1 Documented in Chart      Comment 2 Notify RN     GLUCOSE, CAPILLARY     Status: Abnormal   Collection Time   03/01/12  4:22 PM      Component Value Range Comment   Glucose-Capillary 137 (*) 70 - 99 mg/dL   GLUCOSE, CAPILLARY     Status: Abnormal   Collection Time   03/01/12  8:39 PM      Component Value Range Comment   Glucose-Capillary 128 (*) 70 - 99 mg/dL   CBC     Status: Abnormal   Collection Time   03/02/12  6:10 AM      Component Value Range Comment   WBC 6.6  4.0 - 10.5 K/uL    RBC 3.11 (*) 3.87 - 5.11 MIL/uL    Hemoglobin 8.7 (*) 12.0 - 15.0 g/dL    HCT 40.9 (*) 81.1 - 46.0 %    MCV 84.9  78.0 - 100.0 fL    MCH 28.0  26.0 - 34.0 pg    MCHC 33.0  30.0 - 36.0 g/dL    RDW 91.4 (*) 78.2 - 15.5 %    Platelets 283  150 - 400 K/uL   BASIC METABOLIC PANEL     Status: Abnormal   Collection Time   03/02/12  6:10 AM      Component Value Range Comment   Sodium 139  135 - 145 mEq/L    Potassium 3.7  3.5 - 5.1 mEq/L    Chloride 108  96 -  112 mEq/L    CO2 18 (*) 19 - 32 mEq/L    Glucose, Bld 140 (*) 70 - 99 mg/dL    BUN 9  6 - 23 mg/dL    Creatinine, Ser 4.09  0.50 - 1.10 mg/dL    Calcium 8.4  8.4 - 81.1 mg/dL    GFR calc non Af Amer 76 (*) >90 mL/min    GFR calc Af Amer 88 (*) >90 mL/min   GLUCOSE, CAPILLARY     Status: Abnormal   Collection Time   03/02/12  7:34 AM      Component Value Range Comment   Glucose-Capillary 124 (*) 70 - 99 mg/dL   GLUCOSE, CAPILLARY     Status: Abnormal   Collection Time   03/02/12 11:57 AM      Component Value Range Comment   Glucose-Capillary 135 (*) 70 - 99 mg/dL   GLUCOSE, CAPILLARY     Status: Abnormal   Collection Time   03/02/12  4:55 PM      Component Value Range Comment   Glucose-Capillary 107 (*) 70 - 99 mg/dL   GLUCOSE, CAPILLARY     Status: Abnormal   Collection Time   03/02/12  9:01 PM      Component Value Range Comment   Glucose-Capillary 134 (*) 70 - 99 mg/dL   CBC     Status: Abnormal   Collection Time   03/03/12  6:44 AM      Component Value Range Comment   WBC 7.4  4.0 - 10.5 K/uL    RBC 3.06 (*) 3.87 - 5.11 MIL/uL    Hemoglobin 8.6 (*) 12.0 - 15.0 g/dL    HCT 91.4 (*) 78.2 - 46.0 %    MCV 85.3  78.0 - 100.0 fL    MCH 28.1  26.0 - 34.0 pg    MCHC 33.0  30.0 - 36.0  g/dL    RDW 95.6  21.3 - 08.6 %    Platelets 271  150 - 400 K/uL      HPI :Brittany Ford is an 68 y.o. lack female who presents with black stools and weakness with orthostasis with a hemoglobin drop from 11 as a baseline to 6 on admission. She states her stools have been darker than normal. She takes Xrealto or atrial fibrillation. She denies any abdominal pain. She does not recall having had EGD or colonoscopy in the past. She does not use nonsteroidal anti-inflammatory drugs. She has not had any vomiting   HOSPITAL COURSE:   GI bleed  -Appreciate gastroenterology assistance  -EGD/Colonoscopy negative for bleeding source  Capsule endoscopy normal without any bleeding, blood products, or abnormalities noted. She may have had mucosal bleeding on the anticoagulation (prior to admit) that has since resolved. Ok to resume Xarelto from GI standpoint but defer timing of starting it to cardiology Dr. French Ana Ford. She also needs a hematology evaluation on patient, PCP to set up this referral At this point the patient refuses to continue with Xarelto because patient states she would rather NOT be on that if that is increasing her risk of bleeding. Advised her that she and her cardiologist need to discuss that medicine but she likely will need to be back on it.   Anemia associated with acute blood loss  -Hemoglobin nadir of 6.4, baseline hemoglobin 11  -Has received 2 units of packed red blood cells this admission - hgb 8.7  -CBC in in one week   Diabetes mellitus - diet controlled  -  Hemoglobin A1c 6.3  -Continue sliding scale insulin  -According to the medication reconciliation sheet patient was not on diabetic medications prior to admission    Hyperlipidemia  -Not on medications prior to admission   PAF (paroxysmal atrial fibrillation)/atrial flutter  -Continue to hold Xarelto until source of bleeding determined  -Currently rate controlled  -Continue metoprolol    DVT  prophylaxis: SCDs  Code Status: Full  Family Communication: Spoke with patient  Disposition Plan: Telemetry  Consultants:  Gastroenterology    Procedures:  EGD-(02/29/12):  ENDOSCOPIC IMPRESSION:Small hiatal hernia, no source of upper GI  bleeding seen.  RECOMMENDATIONS:Will review when last colonoscopy was and possibly  set up for tomorrow or as an outpatient.  Colonoscopy (03/01/12):  ENDOSCOPIC IMPRESSION:left-sided diverticulosis no other source of  bleeding  RECOMMENDATIONS:proceed with capsule endoscopy    Discharge Exam:  Blood pressure 137/64, pulse 67, temperature 98 F (36.7 C), temperature source Oral, resp. rate 18, height 5\' 11"  (1.803 m), weight 102.694 kg (226 lb 6.4 oz), SpO2 99.00%. General: No acute respiratory distress  Lungs: Clear to auscultation bilaterally without wheezes or crackles  Cardiovascular: Regular rate and rhythm without murmur gallop or rub normal S1 and S2  Abdomen: Nontender, nondistended, soft, bowel sounds positive, no rebound, no ascites, no appreciable mass  Extremities: No significant cyanosis, clubbing, or edema bilateral lower extremities           Follow-up Information    Schedule an appointment as soon as possible for a visit with Brittany Alken, MD. (CBC in one week)    Contact information:   [address] [place] [address] [phone]       Follow up with Shirley Friar., MD. Schedule an appointment as soon as possible for a visit in 4 weeks.   Contact information:   [address], [address] Jaynie Crumble [place] [address] [phone]          Signed: Richarda Overlie 03/03/2012, 8:33 AM

## 2012-03-04 ENCOUNTER — Encounter (HOSPITAL_COMMUNITY): Payer: Self-pay | Admitting: Gastroenterology

## 2012-04-10 ENCOUNTER — Encounter: Payer: Self-pay | Admitting: *Deleted

## 2012-11-14 ENCOUNTER — Ambulatory Visit (INDEPENDENT_AMBULATORY_CARE_PROVIDER_SITE_OTHER): Payer: Medicare Other | Admitting: Pharmacist

## 2012-11-14 DIAGNOSIS — I4891 Unspecified atrial fibrillation: Secondary | ICD-10-CM

## 2012-11-14 DIAGNOSIS — I482 Chronic atrial fibrillation, unspecified: Secondary | ICD-10-CM | POA: Insufficient documentation

## 2012-11-14 LAB — POCT INR: INR: 2.4

## 2012-11-25 ENCOUNTER — Ambulatory Visit: Payer: Medicare Other | Admitting: Cardiology

## 2012-11-26 ENCOUNTER — Encounter (INDEPENDENT_AMBULATORY_CARE_PROVIDER_SITE_OTHER): Payer: Self-pay

## 2012-11-26 ENCOUNTER — Encounter: Payer: Self-pay | Admitting: Interventional Cardiology

## 2012-11-26 ENCOUNTER — Ambulatory Visit (INDEPENDENT_AMBULATORY_CARE_PROVIDER_SITE_OTHER): Payer: Medicare Other | Admitting: Interventional Cardiology

## 2012-11-26 VITALS — BP 132/80 | HR 60 | Ht 71.0 in | Wt 226.1 lb

## 2012-11-26 DIAGNOSIS — E785 Hyperlipidemia, unspecified: Secondary | ICD-10-CM

## 2012-11-26 DIAGNOSIS — I340 Nonrheumatic mitral (valve) insufficiency: Secondary | ICD-10-CM | POA: Insufficient documentation

## 2012-11-26 DIAGNOSIS — I4891 Unspecified atrial fibrillation: Secondary | ICD-10-CM

## 2012-11-26 DIAGNOSIS — I739 Peripheral vascular disease, unspecified: Secondary | ICD-10-CM | POA: Insufficient documentation

## 2012-11-26 DIAGNOSIS — I059 Rheumatic mitral valve disease, unspecified: Secondary | ICD-10-CM

## 2012-11-26 MED ORDER — METOPROLOL TARTRATE 50 MG PO TABS: ORAL_TABLET | ORAL | Status: DC

## 2012-11-26 NOTE — Patient Instructions (Signed)
Your physician wants you to follow-up in: 6 months with Dr. Eldridge Dace. You will receive a reminder letter in the mail two months in advance. If you don't receive a letter, please call our office to schedule the follow-up appointment.  You can take an extra 25 mg of Metoporlol if you have atrial fibrillation symptoms.

## 2012-11-26 NOTE — Progress Notes (Signed)
Patient ID: Brittany Ford, female   DOB: September 14, 1945, 67 y.o.   MRN: [id number]    [address] [place], [place]  [address] Phone: [phone] Fax:  [phone]  Date:  11/26/2012   ID:  Brittany Ford, Brittany Ford 09-27-1945, MRN [id number]  PCP:  [name], MD      History of Present Illness: Brittany Ford is a 67 y.o. female who has had AFib and mild valvular regurgitation. SHe had an abnormal ABI and other noninvasive tests. The left ABI was 0.84. The right ABI was 1.0. She does not report pain in the calves with walking. SHe has cold feet bilaterally.   She had paroxysmal atrial fibrillation.  She had a GI bleed with Xarelto.  She now takes Warfarin.  She has not had any bleeding problems.  GI w/u was negative at the time of the bleed.  She still has some PAF sx.  It can be as much as 2-3 x/week.  She has to stop her activites for at least a few minutes to an hour.  SHe will have a headache.    Monitor in Jan 2014 showed 4 episodes of AFib, up to 42 minutes.      Wt Readings from Last 3 Encounters:  11/26/12 226 lb 1.9 oz (102.567 kg)  03/02/12 226 lb 6.4 oz (102.694 kg)  03/02/12 226 lb 6.4 oz (102.694 kg)     Past Medical History  Diagnosis Date  . Chest pain     with typical and atypical qualities  . GERD (gastroesophageal reflux disease)   . Chronic low back pain   . Obesity   . Diabetes mellitus without complication     Diet Controlled  . Atrial flutter   . Atrial fibrillation     Current Outpatient Prescriptions  Medication Sig Dispense Refill  . acetaminophen (TYLENOL) 325 MG tablet Take 650 mg by mouth every 6 (six) hours as needed for pain.      . Calcium Carbonate-Vitamin D (CALTRATE 600+D PO) Take by mouth every morning.      . cholecalciferol (VITAMIN D) 1000 UNITS tablet Take 1,000 Units by mouth daily.      . furosemide (LASIX) 20 MG tablet Take 20 mg by mouth as needed for edema.      . Iron-Vitamins (GERITOL  COMPLETE PO) Take by mouth 3 (three) times a week.      . metoprolol (LOPRESSOR) 50 MG tablet Take 50 mg by mouth 2 (two) times daily.      . Prenatal Vit-Fe Fumarate-FA (PRENATAL MULTIVITAMIN) TABS tablet Take 1 tablet by mouth daily at 12 noon.      . traMADol (ULTRAM) 50 MG tablet Take 50 mg by mouth 3 (three) times daily.      Marland Kitchen warfarin (COUMADIN) 2.5 MG tablet Take 2.5 mg by mouth daily. Take three tablets daily       No current facility-administered medications for this visit.    Allergies:   No Known Allergies  Social History:  The patient  reports that she quit smoking about 5 years ago. Her smoking use included Cigarettes. She has a 10 pack-year smoking history. She does not have any smokeless tobacco history on file. She reports that she does not drink alcohol or use illicit drugs.   Family History:  The patient's family history includes Breast cancer in her mother; COPD in her father; Heart attack in her brother and father; Lung cancer in her father.  ROS:  Please see the history of present illness.  No nausea, vomiting.  No fevers, chills.  No focal weakness.  No dysuria.    All other systems reviewed and negative.   PHYSICAL EXAM: VS:  BP 132/80  Pulse 60  Ht 5\' 11"  (1.803 m)  Wt 226 lb 1.9 oz (102.567 kg)  BMI 31.55 kg/m2 Well nourished, well developed, in no acute distress HEENT: normal Neck: no JVD, no carotid bruits Cardiac:  normal S1, S2; RRR;  Lungs:  clear to auscultation bilaterally, no wheezing, rhonchi or rales Abd: soft, nontender, no hepatomegaly Ext: no edema Skin: warm and dry Neuro:   no focal abnormalities noted  EKG:       ASSESSMENT AND PLAN:  1. AFib- NSR for the most part.  Continue metoprolol.  Coumadin for stroke prevention. Can take extra 25 mg when she has an episode.  We discussed antiarrhythmic therapy but she would like to stick with the medications that she is already taking. We discussed how close but since this is not generic, she is  concerned about cost. 2. PAD- Mildly decrease ABI on the left.  No claudication sx.  WIll hold off on Doppler.  3. Mitral regurgitation- mild on prior echocardiogram.  We again discussed the fact that he does not need any surgery for this. She is now having heart failure symptoms. She would not be able to get off of any medications at this was fixed. She is trying to get off of as many medications as possible. 4. Hyperlipidemia: Prior LDL was 161. More recent LDL in early 2014 was 128. She would like to hold off on any new medication at this time. Ideally, I would like to see her LDL less than 100.    Signed, Fredric Mare, MD, [facility] 11/26/2012 4:16 PM

## 2012-12-19 ENCOUNTER — Ambulatory Visit (INDEPENDENT_AMBULATORY_CARE_PROVIDER_SITE_OTHER): Payer: Medicare Other | Admitting: Pharmacist

## 2012-12-19 DIAGNOSIS — I4891 Unspecified atrial fibrillation: Secondary | ICD-10-CM

## 2012-12-19 LAB — POCT INR: INR: 2.2

## 2013-02-07 ENCOUNTER — Ambulatory Visit: Payer: Self-pay | Admitting: Pharmacist

## 2013-02-07 DIAGNOSIS — I4891 Unspecified atrial fibrillation: Secondary | ICD-10-CM

## 2013-02-15 ENCOUNTER — Other Ambulatory Visit: Payer: Self-pay | Admitting: Cardiology

## 2013-02-18 ENCOUNTER — Other Ambulatory Visit: Payer: Self-pay

## 2013-02-18 MED ORDER — METOPROLOL TARTRATE 50 MG PO TABS: ORAL_TABLET | ORAL | Status: DC

## 2013-02-27 ENCOUNTER — Telehealth: Payer: Self-pay | Admitting: *Deleted

## 2013-02-27 NOTE — Telephone Encounter (Signed)
I called [facility] to find out if patient followed up with Dr Madilyn FiremanHayes in the office. Staff member states that patient has had multiple procedures completed by Dr Madilyn FiremanHayes in the hospital. [facility] and [facility] Dr Juanda ChanceBrodie. However, patient has never followed up with Dr Juanda ChanceBrodie. In fact, we have not seen her for anything since her colonoscopy in 04/2003. Therefore, [facility] considers her as their patient.

## 2013-04-22 ENCOUNTER — Observation Stay (HOSPITAL_COMMUNITY)
Admission: EM | Admit: 2013-04-22 | Discharge: 2013-04-23 | Disposition: A | Discharge status: Home / Self Care | Payer: Medicare Other | Attending: Internal Medicine | Admitting: Internal Medicine

## 2013-04-22 ENCOUNTER — Emergency Department (HOSPITAL_COMMUNITY): Payer: Medicare Other

## 2013-04-22 ENCOUNTER — Encounter (HOSPITAL_COMMUNITY): Payer: Self-pay | Admitting: Emergency Medicine

## 2013-04-22 ENCOUNTER — Telehealth: Payer: Self-pay | Admitting: Interventional Cardiology

## 2013-04-22 DIAGNOSIS — R0789 Other chest pain: Principal | ICD-10-CM | POA: Insufficient documentation

## 2013-04-22 DIAGNOSIS — R079 Chest pain, unspecified: Secondary | ICD-10-CM | POA: Diagnosis present

## 2013-04-22 DIAGNOSIS — R110 Nausea: Secondary | ICD-10-CM | POA: Insufficient documentation

## 2013-04-22 DIAGNOSIS — E119 Type 2 diabetes mellitus without complications: Secondary | ICD-10-CM | POA: Insufficient documentation

## 2013-04-22 DIAGNOSIS — K219 Gastro-esophageal reflux disease without esophagitis: Secondary | ICD-10-CM | POA: Insufficient documentation

## 2013-04-22 DIAGNOSIS — I480 Paroxysmal atrial fibrillation: Secondary | ICD-10-CM | POA: Diagnosis present

## 2013-04-22 DIAGNOSIS — I340 Nonrheumatic mitral (valve) insufficiency: Secondary | ICD-10-CM | POA: Diagnosis present

## 2013-04-22 DIAGNOSIS — R0602 Shortness of breath: Secondary | ICD-10-CM

## 2013-04-22 DIAGNOSIS — R42 Dizziness and giddiness: Secondary | ICD-10-CM | POA: Insufficient documentation

## 2013-04-22 DIAGNOSIS — R51 Headache: Secondary | ICD-10-CM | POA: Insufficient documentation

## 2013-04-22 DIAGNOSIS — Z87891 Personal history of nicotine dependence: Secondary | ICD-10-CM | POA: Insufficient documentation

## 2013-04-22 DIAGNOSIS — I4891 Unspecified atrial fibrillation: Secondary | ICD-10-CM

## 2013-04-22 DIAGNOSIS — R5383 Other fatigue: Secondary | ICD-10-CM

## 2013-04-22 DIAGNOSIS — R5381 Other malaise: Secondary | ICD-10-CM | POA: Insufficient documentation

## 2013-04-22 DIAGNOSIS — G8929 Other chronic pain: Secondary | ICD-10-CM | POA: Insufficient documentation

## 2013-04-22 DIAGNOSIS — E785 Hyperlipidemia, unspecified: Secondary | ICD-10-CM

## 2013-04-22 DIAGNOSIS — E669 Obesity, unspecified: Secondary | ICD-10-CM | POA: Insufficient documentation

## 2013-04-22 DIAGNOSIS — I739 Peripheral vascular disease, unspecified: Secondary | ICD-10-CM

## 2013-04-22 DIAGNOSIS — Z79899 Other long term (current) drug therapy: Secondary | ICD-10-CM | POA: Insufficient documentation

## 2013-04-22 DIAGNOSIS — Z7901 Long term (current) use of anticoagulants: Secondary | ICD-10-CM | POA: Insufficient documentation

## 2013-04-22 DIAGNOSIS — IMO0001 Reserved for inherently not codable concepts without codable children: Secondary | ICD-10-CM | POA: Diagnosis present

## 2013-04-22 DIAGNOSIS — M79609 Pain in unspecified limb: Secondary | ICD-10-CM | POA: Insufficient documentation

## 2013-04-22 HISTORY — DX: Cardiac arrhythmia, unspecified: I49.9

## 2013-04-22 LAB — TROPONIN I: Troponin I: <0.3 ng/mL (ref <0.30)

## 2013-04-22 LAB — BASIC METABOLIC PANEL
BUN: 14 mg/dL (ref 6–23)
CO2: 24 mEq/L (ref 19–32)
Calcium: 9.1 mg/dL (ref 8.4–10.5)
Chloride: 102 mEq/L (ref 96–112)
Creatinine, Ser: 0.8 mg/dL (ref 0.50–1.10)
GFR calc Af Amer: 86 mL/min — ABNORMAL LOW (ref >90)
GFR calc non Af Amer: 75 mL/min — ABNORMAL LOW (ref >90)
Glucose, Bld: 216 mg/dL — ABNORMAL HIGH (ref 70–99)
Potassium: 4.3 mEq/L (ref 3.7–5.3)
Sodium: 139 mEq/L (ref 137–147)

## 2013-04-22 LAB — CBC
HCT: 37.4 % (ref 36.0–46.0)
HCT: 34.5 % — ABNORMAL LOW (ref 36.0–46.0)
Hemoglobin: 12.4 g/dL (ref 12.0–15.0)
Hemoglobin: 11.2 g/dL — ABNORMAL LOW (ref 12.0–15.0)
MCH: 27.8 pg (ref 26.0–34.0)
MCH: 27.5 pg (ref 26.0–34.0)
MCHC: 33.2 g/dL (ref 30.0–36.0)
MCHC: 32.5 g/dL (ref 30.0–36.0)
MCV: 83.9 fL (ref 78.0–100.0)
MCV: 84.6 fL (ref 78.0–100.0)
Platelets: 264 10*3/uL (ref 150–400)
Platelets: 233 10*3/uL (ref 150–400)
RBC: 4.46 MIL/uL (ref 3.87–5.11)
RBC: 4.08 MIL/uL (ref 3.87–5.11)
RDW: 14.7 % (ref 11.5–15.5)
RDW: 14.7 % (ref 11.5–15.5)
WBC: 7.9 10*3/uL (ref 4.0–10.5)
WBC: 7 10*3/uL (ref 4.0–10.5)

## 2013-04-22 LAB — I-STAT TROPONIN, ED: Troponin i, poc: 0 ng/mL (ref 0.00–0.08)

## 2013-04-22 LAB — PROTIME-INR
INR: 2.28 — ABNORMAL HIGH (ref 0.00–1.49)
Prothrombin Time: 24.4 seconds — ABNORMAL HIGH (ref 11.6–15.2)

## 2013-04-22 LAB — APTT: aPTT: 48 seconds — ABNORMAL HIGH (ref 24–37)

## 2013-04-22 MED ORDER — HEPARIN SODIUM (PORCINE) 5000 UNIT/ML IJ SOLN: 5000.0000 [IU] | Freq: Three times a day (TID) | INTRAMUSCULAR | Status: DC

## 2013-04-22 MED ORDER — GERITOL COMPLETE PO TABS: 1.0000 | ORAL_TABLET | Freq: Every morning | ORAL | Status: DC

## 2013-04-22 MED ORDER — SODIUM CHLORIDE 0.9 % IJ SOLN
3.0000 mL | Freq: Two times a day (BID) | INTRAMUSCULAR | Status: DC
  Administered 2013-04-23 (×2): 3 mL via INTRAVENOUS

## 2013-04-22 MED ORDER — INSULIN ASPART 100 UNIT/ML ~~LOC~~ SOLN: 0.0000 [IU] | Freq: Every day | SUBCUTANEOUS | Status: DC

## 2013-04-22 MED ORDER — VITAMIN D3 25 MCG (1000 UNIT) PO TABS
1000.0000 [IU] | ORAL_TABLET | Freq: Every day | ORAL | Status: DC
  Filled 2013-04-22 (×2): qty 1

## 2013-04-22 MED ORDER — TRAMADOL HCL 50 MG PO TABS: 50.0000 mg | ORAL_TABLET | Freq: Four times a day (QID) | ORAL | Status: DC | PRN

## 2013-04-22 MED ORDER — ONDANSETRON HCL 4 MG/2ML IJ SOLN
4.0000 mg | Freq: Once | INTRAMUSCULAR | Status: AC
Start: 2013-04-22 — End: 2013-04-22
  Administered 2013-04-22: 4 mg via INTRAVENOUS
  Filled 2013-04-22: qty 2

## 2013-04-22 MED ORDER — ACETAMINOPHEN 325 MG PO TABS: 650.0000 mg | ORAL_TABLET | Freq: Four times a day (QID) | ORAL | Status: DC | PRN

## 2013-04-22 MED ORDER — SODIUM CHLORIDE 0.9 % IV BOLUS (SEPSIS)
1000.0000 mL | Freq: Once | INTRAVENOUS | Status: AC
  Administered 2013-04-22: 1000 mL via INTRAVENOUS

## 2013-04-22 MED ORDER — MORPHINE SULFATE 2 MG/ML IJ SOLN
2.0000 mg | Freq: Once | INTRAMUSCULAR | Status: AC
  Administered 2013-04-22: 2 mg via INTRAVENOUS
  Filled 2013-04-22: qty 1

## 2013-04-22 MED ORDER — PRENATAL MULTIVITAMIN CH
1.0000 | ORAL_TABLET | Freq: Every day | ORAL | Status: DC
  Filled 2013-04-22 (×2): qty 1

## 2013-04-22 MED ORDER — WARFARIN SODIUM 7.5 MG PO TABS
7.5000 mg | ORAL_TABLET | Freq: Once | ORAL | Status: AC
  Administered 2013-04-22: 7.5 mg via ORAL
  Filled 2013-04-22: qty 1

## 2013-04-22 MED ORDER — INSULIN ASPART 100 UNIT/ML ~~LOC~~ SOLN: 0.0000 [IU] | Freq: Three times a day (TID) | SUBCUTANEOUS | Status: DC

## 2013-04-22 MED ORDER — WARFARIN - PHARMACIST DOSING INPATIENT: Freq: Every day | Status: DC

## 2013-04-22 NOTE — ED Notes (Signed)
Family at bedside. 

## 2013-04-22 NOTE — ED Notes (Signed)
Assisted pt to the restroom, pt ambulated well with minimum assistance.

## 2013-04-22 NOTE — Progress Notes (Addendum)
Brittany CONSULT NOTE - Initial Consult  Pharmacy Consult for warfarin Indication: atrial fibrillation  No Known Allergies  Patient Measurements: Weight: 232 lb (105.235 kg)  Vital Signs: Temp: 97.7 F (36.5 C) (03/17 1423) Temp src: Oral (03/17 1423) BP: 144/68 mmHg (03/17 2133) Pulse Rate: 53 (03/17 1915)  Labs:  Recent Labs  04/22/13 1425 04/22/13 1606 04/22/13 2149  HGB 12.4  --   --   HCT 37.4  --   --   PLT 264  --   --   APTT  --  48*  --   LABPROT  --  24.4*  --   INR  --  2.28*  --   CREATININE 0.80  --   --   TROPONINI  --   --  <0.30    The CrCl is unknown because both a height and weight (above a minimum accepted value) are required for this calculation.   Medical History: Past Medical History  Diagnosis Date  . Chest pain     with typical and atypical qualities  . GERD (gastroesophageal reflux disease)   . Chronic low back pain   . Obesity   . Diabetes mellitus without complication     Diet Controlled  . Atrial flutter   . Atrial fibrillation     Medications:  See med history  Assessment: [age] yof presented to the ED with CP. She is on chronic coumadin for afib. Coumadin to be resumed. INR is therapeutic. SQ heparin also ordered. CBC is WNL, no bleeding noted.   Goal of Therapy:  INR 2-2.5 (per outpatient Brittany note 02/07/13)   Plan:  1. Warfarin 7.5mg  PO x  1 tonight (her normal home dose) 2. Daily INR 3. DC SQ heparin  Rumbarger, Drake Leachachel Lynn 04/22/2013,10:48 PM

## 2013-04-22 NOTE — Telephone Encounter (Signed)
Spoke with pt and she is having pain under her left arm near her arm. This pain started on Sunday and the pain it is an intermittent shooting pain throughout the day. Pt felt bad this morning. She has felt fatigued and she has had pounding in her chest more frequently. Pt has taken an extra 1/2 tablet of the Metoprolol BID since Sunday and it doesn't seem to help. Pt has also been nauseated and faint since Sunday.

## 2013-04-22 NOTE — Telephone Encounter (Signed)
Per Dr. Mayford Knifeurner pt should go to the Er if she is having active arm pain. Spoke with pt and she is not feeling well and still having intermittent arm pain. She agrees that she should probably go to the ED. Pt will head to the ED this afternoon.  Fyi to Dr. Mayford Knifeurner.

## 2013-04-22 NOTE — Telephone Encounter (Signed)
Agree 

## 2013-04-22 NOTE — ED Provider Notes (Addendum)
Medical screening examination/treatment/procedure(s) were conducted as a shared visit with non-physician practitioner(s) and myself.  I personally evaluated the patient during the encounter.   EKG Interpretation   Date/Time:  Tuesday April 22 2013 14:22:11 EDT Ventricular Rate:  56 PR Interval:  186 QRS Duration: 72 QT Interval:  450 QTC Calculation: 434 R Axis:   88 Text Interpretation:  Sinus bradycardia Right atrial enlargement  Borderline ECG No significant change since last tracing Confirmed by  ZACKOWSKI  MD, SCOTT [phone]) on 04/22/2013 4:20:19 PM       Results for orders placed during the hospital encounter of 04/22/13  CBC      Result Value Ref Range   WBC 7.9  4.0 - 10.5 K/uL   RBC 4.46  3.87 - 5.11 MIL/uL   Hemoglobin 12.4  12.0 - 15.0 g/dL   HCT 02.7  25.3 - 66.4 %   MCV 83.9  78.0 - 100.0 fL   MCH 27.8  26.0 - 34.0 pg   MCHC 33.2  30.0 - 36.0 g/dL   RDW 40.3  47.4 - 25.9 %   Platelets 264  150 - 400 K/uL  BASIC METABOLIC PANEL      Result Value Ref Range   Sodium 139  137 - 147 mEq/L   Potassium 4.3  3.7 - 5.3 mEq/L   Chloride 102  96 - 112 mEq/L   CO2 24  19 - 32 mEq/L   Glucose, Bld 216 (*) 70 - 99 mg/dL   BUN 14  6 - 23 mg/dL   Creatinine, Ser 5.63  0.50 - 1.10 mg/dL   Calcium 9.1  8.4 - 87.5 mg/dL   GFR calc non Af Amer 75 (*) >90 mL/min   GFR calc Af Amer 86 (*) >90 mL/min  I-STAT TROPOININ, ED      Result Value Ref Range   Troponin i, poc 0.00  0.00 - 0.08 ng/mL   Comment 3            Results for orders placed during the hospital encounter of 04/22/13  CBC      Result Value Ref Range   WBC 7.9  4.0 - 10.5 K/uL   RBC 4.46  3.87 - 5.11 MIL/uL   Hemoglobin 12.4  12.0 - 15.0 g/dL   HCT 64.3  32.9 - 51.8 %   MCV 83.9  78.0 - 100.0 fL   MCH 27.8  26.0 - 34.0 pg   MCHC 33.2  30.0 - 36.0 g/dL   RDW 84.1  66.0 - 63.0 %   Platelets 264  150 - 400 K/uL  BASIC METABOLIC PANEL      Result Value Ref Range   Sodium 139  137 - 147 mEq/L   Potassium 4.3   3.7 - 5.3 mEq/L   Chloride 102  96 - 112 mEq/L   CO2 24  19 - 32 mEq/L   Glucose, Bld 216 (*) 70 - 99 mg/dL   BUN 14  6 - 23 mg/dL   Creatinine, Ser 1.60  0.50 - 1.10 mg/dL   Calcium 9.1  8.4 - 10.9 mg/dL   GFR calc non Af Amer 75 (*) >90 mL/min   GFR calc Af Amer 86 (*) >90 mL/min  I-STAT TROPOININ, ED      Result Value Ref Range   Troponin i, poc 0.00  0.00 - 0.08 ng/mL   Comment 3            Dg Chest 2 View  04/22/2013  CLINICAL DATA:  Left-sided chest pain since March 15th, history of asthma and atrial fibrillation  EXAM: CHEST  2 VIEW  COMPARISON:  DG CHEST 1V PORT dated 02/28/2012  FINDINGS: The heart size and mediastinal contours are within normal limits. Both lungs are clear. The visualized skeletal structures are unremarkable.  IMPRESSION: No active cardiopulmonary disease.   Electronically Signed   By: Esperanza Heiraymond  Rubner M.D.   On: 04/22/2013 15:35     With past history of atrophic relation however today's EKG does show sinus bradycardia. Patient seen by me. Patient with complaint of chest pain that radiates from left inner arm down to the chest area also reports not feeling herself feels as if she's dizzy and pale. Patient will receive cardiac workup she has risk factors to include diabetes in the past history of atrial flutter fibrillation. Chest pain the past as well with typical and atypical qualities. Patient is on Coumadin and on Lopressor. Patient's initial troponin is negative labs without significant findings. Chest x-ray also negative for any evidence of pulmonary edema pneumothorax or pneumonia.  Shelda JakesScott W. Zackowski, MD 04/22/13 1623   Patient's initial labs without significant abnormalities however patient is continuing to have some intermittent chest pain. We'll do need to discuss with her cardiologist the considering probable need for admission. Patient's bradycardia sinus and most likely due to the Lopressor that she took earlier today.  Shelda JakesScott W. Zackowski,  MD 04/22/13 [phone]

## 2013-04-22 NOTE — Consult Note (Signed)
Cardiology Consultation Note  Patient ID: Brittany Ford, MRN: [id number], DOB/AGE: 1945/09/11 68 y.o. Admit date: 04/22/2013   Date of Consult: 04/22/2013 Primary Physician: [name], MD Primary Cardiologist: Everette Rank, MD  Chief Complaint: Chest pain Reason for Consult: Atypical chest pain  HPI: The patient is a 68 year old woman with history of paroxysmal atrial fibrillation, diabetes mellitus (the patient denies, hyperlipidemia, PVD with abnormal ABI in 12/2011, GERD, and prior GI bleed while on Xarelto, whom we have been asked to evaluate to assist with further management of chest pain.  The patient states that she has been having intermittent jabbing pains in the left axilla extending to the left chest for the last 3 days.  The pain typically lasts only a few seconds and can occur multiple times over the course of an hour and then not recur for several hours.  She notes associated tingling in the left hand but denies associated dyspnea, nausea, or diaphoresis.  Since her chest discomfort started, the patient has felt somewhat lightheaded and generally fatigued.  She denies having similar chest ain in the past and has not had any trauma to the chest area.  The pain does not seem to be exertional either.  However, the patient endorses progressive exertional dyspnea over the last few months.  She now has considerable shortness of breath after climbing 14 steps to go from the ground floor to the top floor for home.  The patient has also noted intermittent palpitations, including earlier today.  These are similar to what she's experienced in the past with her atrial fibrillation.  The patient remains compliant with her medications, including metoprolol and warfarin.  The patient underwent a Cardiolite stress test in 2010 for palpitations and atypical chest pain, though she notes that her discomfort at that time was different than what she has been experiencing over the last few  days.  The study was negative.   Past Medical History  Diagnosis Date  . Chest pain     with typical and atypical qualities  . GERD (gastroesophageal reflux disease)   . Chronic low back pain   . Obesity   . Diabetes mellitus without complication     Diet Controlled  . Atrial flutter   . Atrial fibrillation       Most Recent Cardiac Studies: Echo (03/08/12): Normal LV size and wall thickness with EF 79%, mild MR, aortic sclerosis, mild TR, and trivial PR.  Cardiolite stress test (03/03/08): Normal  ABI (12/18/11): right 0.93, left 0.74   Surgical History:  Past Surgical History  Procedure Laterality Date  . Foot surgery      x2  . Tonsillectomy    . Esophagogastroduodenoscopy  02/29/2012    Procedure: ESOPHAGOGASTRODUODENOSCOPY (EGD);  Surgeon: [name], MD;  Location: [facility] ENDOSCOPY;  Service: Endoscopy;  Laterality: N/A;  . Colonoscopy  03/01/2012    Procedure: COLONOSCOPY;  Surgeon: [name], MD;  Location: [facility] ENDOSCOPY;  Service: Endoscopy;  Laterality: N/A;     Home Meds: Prior to Admission medications   Medication Sig Start Date End Date Taking? Authorizing Provider  acetaminophen (TYLENOL) 325 MG tablet Take 650 mg by mouth every 6 (six) hours as needed for pain.   Yes Historical Provider, MD  Calcium Carbonate-Vitamin D (CALTRATE 600+D PO) Take by mouth every morning.   Yes Historical Provider, MD  cholecalciferol (VITAMIN D) 1000 UNITS tablet Take 1,000 Units by mouth daily.   Yes Historical Provider, MD  Iron-Vitamins (GERITOL COMPLETE PO) Take by  mouth 3 (three) times a week.   Yes Historical Provider, MD  metoprolol (LOPRESSOR) 50 MG tablet Take 50 mg by mouth 2 (two) times daily.   Yes Historical Provider, MD  polyvinyl alcohol (LIQUIFILM TEARS) 1.4 % ophthalmic solution Place 1 drop into both eyes as needed for dry eyes.   Yes Historical Provider, MD  Prenatal Vit-Fe Fumarate-FA (PRENATAL MULTIVITAMIN) TABS tablet Take 1 tablet by mouth daily at 12 noon.    Yes Historical Provider, MD  traMADol (ULTRAM) 50 MG tablet Take 50-100 mg by mouth every 6 (six) hours as needed for moderate pain.    Yes Historical Provider, MD  warfarin (COUMADIN) 2.5 MG tablet Take 5-7.5 mg by mouth daily. Takes 5mg  on Sunday and Wednesday. Takes 7.5mg  daily all other days   Yes Historical Provider, MD    Inpatient Medications:  None.   Allergies: No Known Allergies  History   Social History  . Marital Status: Widowed    Spouse Name: N/A    Number of Children: 1  . Years of Education: N/A   Occupational History  . Not on file.   Social History Main Topics  . Smoking status: Former Smoker -- 0.50 packs/day for 20 years    Types: Cigarettes    Quit date: 07/21/2007  . Smokeless tobacco: Not on file  . Alcohol Use: No  . Drug Use: No  . Sexual Activity: Not on file   Other Topics Concern  . Not on file   Social History Narrative  . No narrative on file     Family History  Problem Relation Age of Onset  . COPD Father   . Heart attack Father   . Lung cancer Father   . Breast cancer Mother   . Heart attack Brother      Review of Systems: The patient has intermittent neck pain, chronic.  Otherwise, a 10-system review of systems was performed it was negative except as noted in the history of present illness.  Labs:  Recent Labs  04/22/13 2149  TROPONINI <0.30   Lab Results  Component Value Date   WBC 7.0 04/22/2013   HGB 11.2* 04/22/2013   HCT 34.5* 04/22/2013   MCV 84.6 04/22/2013   PLT 233 04/22/2013     Recent Labs Lab 04/22/13 1425  NA 139  K 4.3  CL 102  CO2 24  BUN 14  CREATININE 0.80  CALCIUM 9.1  GLUCOSE 216*   Hemoglobin A1c:  6.7 (01/2012), 6.2 (02/2012)   Radiology/Studies:  Dg Chest 2 View  04/22/2013   CLINICAL DATA:  Left-sided chest pain since March 15th, history of asthma and atrial fibrillation  EXAM: CHEST  2 VIEW  COMPARISON:  DG CHEST 1V PORT dated 02/28/2012  FINDINGS: The heart size and mediastinal  contours are within normal limits. Both lungs are clear. The visualized skeletal structures are unremarkable.  IMPRESSION: No active cardiopulmonary disease.   Electronically Signed   By: Esperanza Heir M.D.   On: 04/22/2013 15:35   EKG: Sinus bradycardia without other significant abnormalities.  Physical Exam: Blood pressure 145/57, pulse 49, temperature 97.7 F (36.5 C), temperature source Oral, resp. rate 15, weight 105.235 kg (232 lb), SpO2 99.00%. General: Obese woman in no acute distress.  She is accompanied by her niece. Head: Normocephalic, atraumatic, sclera non-icteric, no xanthomas, nares are without discharge.  Neck: Negative for carotid bruits. JVD not elevated.  No HJR. Lungs: Clear bilaterally to auscultation without wheezes, rales, or rhonchi. Breathing is unlabored.  Heart: Bradycardic but regular with S1 S2. No murmurs, rubs, or gallops appreciated. Abdomen: Soft, non-tender, non-distended with normoactive bowel sounds. No hepatomegaly. No rebound/guarding. No obvious abdominal masses. Msk:  Strength and tone appear normal for age. Extremities: No clubbing or cyanosis. No edema.  Distal pedal pulses are 2+ and equal bilaterally. Neuro: Alert and oriented X 3. No facial asymmetry. No focal deficit. Moves all extremities spontaneously. Psych:  Responds to questions appropriately with a normal affect.    Assessment and Plan:  68 year old woman with history of proximal atrial fibrillation on warfarin, diabetes mellitus, possible hyperlipidemia, and peripheral vascular disease, whom we have been asked to evaluate to assist with management of atypical chest pain.  Chest pain: The patient's symptoms are atypical.  However, her associated fatigue, lightheadedness and progressive exertional dyspnea are concerning in light of her multiple cardiac risk factors, including age, diabetes, peripheral vascular disease, and possible hyperlipidemia.  Her most recent stress test in 2010 did not  show any evidence of ischemia.  Her EKG today does not show any ischemic changes either.  However, repeat ischemia evaluation is a warranted.  The patient's first 2 troponins have been negative.  We recommend observingthe patient on telemetry withpossible exercise nuclear perfusion stress test tomorrow.  A right edematous and stress test would also be a consideration but the patient adamantly refuses a pharmacologic stress test secondary to discomfort she experienced with her Cardiolite study in 2010.  - Observe on telemetry.  - Obtain third troponin.  - Hold metoprolol and keep patient NPO for possible exercise myocardial perfusion stress test tomorrow.  - Start aspirin 81 mg daily pending results of stress test.  - Start atorvastatin 40 mg daily.  - Check fasting lipid panel and repeat hemoglobin A1c for further risk stratification.  Dyspnea on exertion: This is nonspecific and could be related to a number of factors including generalized deconditioning, obesity, coronary insufficiency, paroxysmal atrial fibrillation, or underlying lung disease.  At this time, the patient is breathing comfortably without evidence of hypoxia.  - Myocardial perfusion stress test, as above.  Atrial fibrillation:  The patient is currently in sinus bradycardia but endorses palpitations suggestive of intermittent episodes of atrial flutter relation.  Given her low resting heart rate today, we could consider reducing her data blocker.  However, since reclining for myocardial perfusion stress test tomorrow with exercise, we will hold her metoprolol until this test has been completed.  We will hold her warfarin pending the stress test should she need coronary angiography in the near future.  - Hold metoprolol for stress test; consider restarting at 25 mg BID given bradycardia and recent lightheadedness/fatigue.  - Hold warfarin pending stress test.  Signed, END, CHRISTOPHER A. MD 04/22/2013, 11:45 PM

## 2013-04-22 NOTE — ED Notes (Signed)
Called report to unit. 

## 2013-04-22 NOTE — ED Provider Notes (Signed)
CSN: [id number]     Arrival date & time 04/22/13  1416 History   First MD Initiated Contact with Patient 04/22/13 1558     Chief Complaint  Patient presents with  . Chest Pain     (Consider location/radiation/quality/duration/timing/severity/associated sxs/prior Treatment) The history is provided by the patient. No language interpreter was used.  Brittany Ford is a 68 y/o F with PMHx of GERD, obesity, DM, afib currently on warfarin presenting to the ED with chest pain, left arm pain and dizziness. Patient reported that she has been feeling "yucky" for the past couple of weeks - reported that she has been feeling fatigued and weak. Stated that on Sunday she noticed that she has been having increased chest pain localized to the left side of the chest - reported that the pain is an intermittent sharp pain that lasts a couple of seconds and is sporadic, stated that the pain originates from the left axilla and radiates to left side of chest and underneath left breast. Patient reported that she has been feeling mildly dizzy. Reported that on Sunday for got so dizzy that she felt like she was going to faint and that the patient grabbed onto the wall to catch herself and slowly walked back to bed and stayed in bed. Patient reported that she has been feeling intermittently racing heart episodes and stated that she gets a headache associated with it. Reported that she has been using 0.5 mg of Metoprolol to aid in this symptoms as per requested by her cardiologist, reported that she has not used any today. Denied fever, chills, or numbness, weakness to the left arm, blurred vision, neck pain, back pain, vomiting, diarrhea, melena, hematochezia, photosensitivity, fall, injury. PCP Dr. Zachery Dauer Cardioloist Dr. Eldridge Dace  Past Medical History  Diagnosis Date  . Chest pain     with typical and atypical qualities  . GERD (gastroesophageal reflux disease)   . Chronic low back pain   . Obesity   . Diabetes  mellitus without complication     Diet Controlled  . Atrial flutter   . Atrial fibrillation    Past Surgical History  Procedure Laterality Date  . Foot surgery      x2  . Tonsillectomy    . Esophagogastroduodenoscopy  02/29/2012    Procedure: ESOPHAGOGASTRODUODENOSCOPY (EGD);  Surgeon: [name], MD;  Location: [facility] ENDOSCOPY;  Service: Endoscopy;  Laterality: N/A;  . Colonoscopy  03/01/2012    Procedure: COLONOSCOPY;  Surgeon: [name], MD;  Location: [facility] ENDOSCOPY;  Service: Endoscopy;  Laterality: N/A;   Family History  Problem Relation Age of Onset  . COPD Father   . Heart attack Father   . Lung cancer Father   . Breast cancer Mother   . Heart attack Brother    History  Substance Use Topics  . Smoking status: Former Smoker -- 0.50 packs/day for 20 years    Types: Cigarettes    Quit date: 07/21/2007  . Smokeless tobacco: Not on file  . Alcohol Use: No   OB History   Grav Para Term Preterm Abortions TAB SAB Ect Mult Living                 Review of Systems  Constitutional: Negative for fever and chills.  Eyes: Negative for visual disturbance.  Respiratory: Negative for chest tightness and shortness of breath.   Cardiovascular: Positive for chest pain.  Gastrointestinal: Positive for nausea. Negative for vomiting, abdominal pain, diarrhea, constipation, blood in stool and anal  bleeding.  Musculoskeletal: Negative for back pain and neck pain.  Neurological: Positive for dizziness and weakness. Negative for syncope.  All other systems reviewed and are negative.      Allergies  Review of patient's allergies indicates no known allergies.  Home Medications   Current Outpatient Rx  Name  Route  Sig  Dispense  Refill  . acetaminophen (TYLENOL) 325 MG tablet   Oral   Take 650 mg by mouth every 6 (six) hours as needed for pain.         . Calcium Carbonate-Vitamin D (CALTRATE 600+D PO)   Oral   Take by mouth every morning.         . cholecalciferol  (VITAMIN D) 1000 UNITS tablet   Oral   Take 1,000 Units by mouth daily.         . Iron-Vitamins (GERITOL COMPLETE PO)   Oral   Take by mouth 3 (three) times a week.         . metoprolol (LOPRESSOR) 50 MG tablet   Oral   Take 50 mg by mouth 2 (two) times daily.         . polyvinyl alcohol (LIQUIFILM TEARS) 1.4 % ophthalmic solution   Both Eyes   Place 1 drop into both eyes as needed for dry eyes.         . Prenatal Vit-Fe Fumarate-FA (PRENATAL MULTIVITAMIN) TABS tablet   Oral   Take 1 tablet by mouth daily at 12 noon.         . traMADol (ULTRAM) 50 MG tablet   Oral   Take 50-100 mg by mouth every 6 (six) hours as needed for moderate pain.          Marland Kitchen warfarin (COUMADIN) 2.5 MG tablet   Oral   Take 5-7.5 mg by mouth daily. Takes 5mg  on Sunday and Wednesday. Takes 7.5mg  daily all other days          BP 144/68  Pulse 53  Temp(Src) 97.7 F (36.5 C) (Oral)  Resp 20  Wt 232 lb (105.235 kg)  SpO2 100% Physical Exam  Nursing note and vitals reviewed. Constitutional: She is oriented to person, place, and time. She appears well-developed and well-nourished. No distress.  HENT:  Head: Normocephalic and atraumatic.  Mouth/Throat: Oropharynx is clear and moist. No oropharyngeal exudate.  Eyes: Conjunctivae and EOM are normal. Pupils are equal, round, and reactive to light. Right eye exhibits no discharge. Left eye exhibits no discharge.  Negative nystagmus Visual fields grossly intact   Neck: Normal range of motion. Neck supple. No tracheal deviation present.  Negative neck stiffness Negative nuchal rigidity Negative pain upon palpation to the c-spine Negative cervical lymphadenopathy   Cardiovascular: Normal rate, regular rhythm and normal heart sounds.  Exam reveals no friction rub.   No murmur heard. Negative swelling, erythema, inflammation, lesions, sores noted to the left upper extremity.  Cap refill < 3 seconds Negative swelling or pitting edema noted to  the lower extremities bilaterally   Pulmonary/Chest: Effort normal and breath sounds normal. No respiratory distress. She has no wheezes. She has no rales. She exhibits no tenderness.  Negative pain upon palpation to the chest wall Negative crepitus  Breast Exam: Negative swelling, erythema, inflammation, lesions, sores, asymmetry noted to the left breast. Negative peu d'orange. Negative active drainage. Negative abnormalities noted to the areola or nipple. Negative signs of mastitis. Negative masses palpated. Negative lymphadenopathy to left axilla.   Musculoskeletal: Normal range of motion. She exhibits  no tenderness.  Full ROM to upper and lower extremities without difficulty noted, negative ataxia noted.  Lymphadenopathy:    She has no cervical adenopathy.  Neurological: She is alert and oriented to person, place, and time. No cranial nerve deficit. She exhibits normal muscle tone. Coordination normal.  Cranial nerves III-XII grossly intact Strength 5+/5+ to upper and lower extremities bilaterally with resistance applied, equal distribution noted Strength intact to MCP, PIP, DIP joints of left hand  Sensation intact with differentiation to sharp and dull touch   Skin: Skin is warm and dry. No rash noted. She is not diaphoretic. No erythema.  Psychiatric: She has a normal mood and affect. Her behavior is normal. Thought content normal.    ED Course  Procedures (including critical care time)  6:06 PM This provider spoke with Dr. Alvester MorinNewton, [facility] - discussed case, history, presentation, labs, imaging in great detail. Recommended cardiology consult and to take it from there.   6:25 PM This provider spoke with Dr. Jones BroomBensihmon, cardiology who reported for Internal Medicine to see the patient. Reported that if Internal Medicine would like cardiology to consult to get back to Dr. Jones BroomBensihmon.   7:50 PM Dr. Alvester MorinNewton in room assessing patient.  Dr. Alvester MorinNewton, [facility], reported that  cardiology is going to consult on the decision and recommend whether or not to admit the patient.   10:02 PM Dr. Wallace Cullens. End, Fellow from cardiology saw and assessed patient. Recommended patient to be admitted to Internal Medicine to be assessed and placed on Telemetry observation. Recommended Metoprolol to be held and for stress test to be performed in the morning.   10:11 PM This provider spoke with Dr. Alvester MorinNewton who reported that he will place the orders in for the patient for Telemetry observation. Discussed to hold Metoprolol since patient has low heart rate. Discussed recommendations with stress test in the morning and for serial troponins to be performed.   Results for orders placed during the hospital encounter of 04/22/13  CBC      Result Value Ref Range   WBC 7.9  4.0 - 10.5 K/uL   RBC 4.46  3.87 - 5.11 MIL/uL   Hemoglobin 12.4  12.0 - 15.0 g/dL   HCT 04.537.4  40.936.0 - 81.146.0 %   MCV 83.9  78.0 - 100.0 fL   MCH 27.8  26.0 - 34.0 pg   MCHC 33.2  30.0 - 36.0 g/dL   RDW 91.414.7  78.211.5 - 95.615.5 %   Platelets 264  150 - 400 K/uL  BASIC METABOLIC PANEL      Result Value Ref Range   Sodium 139  137 - 147 mEq/L   Potassium 4.3  3.7 - 5.3 mEq/L   Chloride 102  96 - 112 mEq/L   CO2 24  19 - 32 mEq/L   Glucose, Bld 216 (*) 70 - 99 mg/dL   BUN 14  6 - 23 mg/dL   Creatinine, Ser 2.130.80  0.50 - 1.10 mg/dL   Calcium 9.1  8.4 - 08.610.5 mg/dL   GFR calc non Af Amer 75 (*) >90 mL/min   GFR calc Af Amer 86 (*) >90 mL/min  PROTIME-INR      Result Value Ref Range   Prothrombin Time 24.4 (*) 11.6 - 15.2 seconds   INR 2.28 (*) 0.00 - 1.49  APTT      Result Value Ref Range   aPTT 48 (*) 24 - 37 seconds  TROPONIN I      Result Value  Ref Range   Troponin I <0.30  <0.30 ng/mL  I-STAT TROPOININ, ED      Result Value Ref Range   Troponin i, poc 0.00  0.00 - 0.08 ng/mL   Comment 3             Labs Review Labs Reviewed  BASIC METABOLIC PANEL - Abnormal; Notable for the following:    Glucose, Bld 216 (*)    GFR  calc non Af Amer 75 (*)    GFR calc Af Amer 86 (*)    All other components within normal limits  PROTIME-INR - Abnormal; Notable for the following:    Prothrombin Time 24.4 (*)    INR 2.28 (*)    All other components within normal limits  APTT - Abnormal; Notable for the following:    aPTT 48 (*)    All other components within normal limits  CBC  TROPONIN I  CBC  CREATININE, SERUM  COMPREHENSIVE METABOLIC PANEL  CBC WITH DIFFERENTIAL  TSH  TROPONIN I  TROPONIN I  TROPONIN I  HEMOGLOBIN A1C  LIPID PANEL  PROTIME-INR  I-STAT TROPOININ, ED   Imaging Review Dg Chest 2 View  04/22/2013   CLINICAL DATA:  Left-sided chest pain since March 15th, history of asthma and atrial fibrillation  EXAM: CHEST  2 VIEW  COMPARISON:  DG CHEST 1V PORT dated 02/28/2012  FINDINGS: The heart size and mediastinal contours are within normal limits. Both lungs are clear. The visualized skeletal structures are unremarkable.  IMPRESSION: No active cardiopulmonary disease.   Electronically Signed   By: Esperanza Heir M.D.   On: 04/22/2013 15:35     EKG Interpretation   Date/Time:  Tuesday April 22 2013 14:22:11 EDT Ventricular Rate:  56 PR Interval:  186 QRS Duration: 72 QT Interval:  450 QTC Calculation: 434 R Axis:   88 Text Interpretation:  Sinus bradycardia Right atrial enlargement  Borderline ECG No significant change since last tracing Confirmed by  ZACKOWSKI  MD, SCOTT [phone]) on 04/22/2013 4:20:19 PM      MDM   Final diagnoses:  Chest pain   Medications  sodium chloride 0.9 % injection 3 mL (not administered)  acetaminophen (TYLENOL) tablet 650 mg (not administered)  traMADol (ULTRAM) tablet 50-100 mg (not administered)  prenatal multivitamin tablet 1 tablet (not administered)  cholecalciferol (VITAMIN D) tablet 1,000 Units (not administered)  Warfarin - Pharmacist Dosing Inpatient (not administered)  insulin aspart (novoLOG) injection 0-9 Units (not administered)  insulin aspart  (novoLOG) injection 0-5 Units (not administered)  sodium chloride 0.9 % bolus 1,000 mL (0 mLs Intravenous Stopped 04/22/13 1944)  sodium chloride 0.9 % bolus 1,000 mL (1,000 mLs Intravenous New Bag/Given 04/22/13 2139)  morphine 2 MG/ML injection 2 mg (2 mg Intravenous Given 04/22/13 2140)  ondansetron (ZOFRAN) injection 4 mg (4 mg Intravenous Given 04/22/13 2140)  warfarin (COUMADIN) tablet 7.5 mg (7.5 mg Oral Given 04/22/13 2307)   Filed Vitals:   04/22/13 2045 04/22/13 2100 04/22/13 2130 04/22/13 2133  BP: 158/66 149/57 131/60 144/68  Pulse:      Temp:      TempSrc:      Resp: 18 22  20   Weight:      SpO2: 99% 100%  100%    Patient presenting to the ED with chest pain, dizziness, and left arm pain that has progressively gotten worse sine Sunday. Patient reported the pain is localized to the left side of the chest described as a intermittent, sporadic sharp pain that  lasts only a couple of seconds. Reported that she felt extremely dizzy on Sunday where she felt like she was going to pass out, but did not. Stated that she has been having heart racing sensations and stated that she has been using 0.5 mg of Metoprolol for control as per requested by her cardiologist - stated that she has not used metoprolol today. Denied shortness of breath, difficulty breathing.  Alert and oriented. GCS 15. Heart rate noted to be bradycardic upon auscultation - normal rhythm. Radial and DP pulses 2+ bilaterally. Negative swelling or pitting edema note to bilaterally lower extremities. Lungs clear to auscultation to upper and lower lobes bilaterally - negative signs of respiratory distress. Breast exam unremarkable. Negative swelling, erythema, inflammation, stricture to left arm. Mild discomfort upon palpation to the axilla region with negative lymphadenopathy noted. Full range of motion without ataxia difficulty. Strength intact with equal distribution. Sensation intact with differentiation to sharp and dull touch.  Strength intact MCP, PIP, DIP joints of the left hand. EKG noted sinus bradycardia with a heart rate of 54 beats per minute with right atrial enlargement. I-STAT troponin negative elevation. Second troponin negative elevated troponin. CBC negative elevation white blood cell count. BMP negative findings. Chest x-ray negative for acute cardiopulmonary disease. This provider reviewed patient's chart-numerous EKGs on record. Patient's heart rate ranged anywhere from about 65-75 beats per minute. Patient presents today with bradycardia, new finding. Patient seen and assessed by cardiology who recommended patient to be admitted to the hospital under the care of the hospitalist - recommended serial troponins. Cardiologist recommended to hold metoprolol. Recommended stress test in the morning. This provider spoke hospitalist for admission of patient. Patient to be admitted to Telemetry floor for observation. Patient agreed to plan and understood. Patient stable for transfer.   Raymon Mutton, PA-C 04/22/13 2348

## 2013-04-22 NOTE — ED Notes (Signed)
MD at bedside. 

## 2013-04-22 NOTE — ED Notes (Signed)
Pt reports chest pain that radiates from inner left arm down to chest area and reports not feeling herself.  Pt reports dizziness, pale.

## 2013-04-22 NOTE — Evaluation (Signed)
Initially called about pt with atypical CP and fatigue since Sunday. Baseline hx/o afib on coumadin. Followed by Susa SimmondsVaranasy. Had some intermittent stabbing L sided CP since Sunday. Sxs last seconds. Also with worsening fatigue over last 2 weeks. Noted increase in metoprolol. Pt feels that sxs may be coming from increased metoprolol dose.  Noted myoview 02/2008 WNL.  Brought into ER by daughter. EKG w/ sinus bradycardia. Trop neg x1. No NTG given on presentation. Sxs clinically resolved. HR 40s-50s on exam. Pt states that she would like to go home as she feels better.  Discussed case with cards PA. Will consult on pt and leave formal recs. Discussed with pt with I will defer decision and discussion to admit to cards team. Also discussed with EDPA. Follow up pending cards recs.

## 2013-04-22 NOTE — Telephone Encounter (Signed)
New message         Pt is not feeling well and would like to come in soon to see dr Brittany Ford. C/o pain in arm. Can you call pt please.

## 2013-04-22 NOTE — H&P (Addendum)
Hospitalist Admission History and Physical  Patient name: Brittany Ford Medical record number: [id number] Date of birth: [birth year] Age: 68 y.o. Gender: female  Primary Care Provider: [name], MD  Chief Complaint: chest pain, palpitations, dyspnea, fatigue   History of Present Illness:This is a 68 y.o. year old female with paroxysmal afib on coumadin, PVD as well as recent admission 02/2012 for GI bleed 2/2 xarelto, diet controlled DM presenting with chest pain and fatigue. Patient states that she's had stabbing left-sided chest pain without radiation it lasts seconds since the weekend. Symptoms occur both at rest as well as with exertion. Pain now exacerbated by deep breathing, coughing, movement. Has had some mild palpitations similar to previous flares of atrial fibrillation. Patient has been using increasing metoprolol dosing to help with this her cardiology records per patient. Has also had progressive dyspnea and fatigue over the past 2-3 months. Patient states that she becomes progressively dyspneic even climb one to 2 flights of stairs. This is brand-new issue. Denies any associated nausea or diaphoresis with symptoms. Patient was brought to the ER today by her daughter because of recurring sharp chest pain. Pt feels that dyspnea and fatigue may be related to increased metoprolol use.   In the ER, EKG with sinus bradycardia. Troponin is within normal limits. Systolic blood pressure ranging from the 100s to 150s. Lab work otherwise within normal limits. INR therapeutic at 2.28 at the time of my exam, patient says that subsequently resolved and she is ready to go home. Did not receive any nitroglycerin or aspirin. Cards consulted with recommendation for obs admission. Cycle CEs and hold metoprolol.   Cardiovascular studies studies: Myoview January 2010: Normal study 2D ECHO 02/2013: mild mitral and tricuspid regurg, EF 79%, no reported diastolic dysfunction Carotid duplex  02/2012: mild bilateral carotid plaque without hemodynamically significant stenosis   Patient Active Problem List   Diagnosis Date Noted  . Mitral regurgitation 11/26/2012  . Peripheral vascular disease, unspecified 11/26/2012  . Cheerleading 02/28/2012  . Anemia associated with acute blood loss 02/28/2012  . GI bleed 02/28/2012  . PAF (paroxysmal atrial fibrillation)/atrial flutter   . Chest pain 01/14/2012  . Palpitations 01/14/2012  . Hyperlipidemia 01/14/2012  . Diabetes mellitus 01/14/2012   Past Medical History: Past Medical History  Diagnosis Date  . Chest pain     with typical and atypical qualities  . GERD (gastroesophageal reflux disease)   . Chronic low back pain   . Obesity   . Diabetes mellitus without complication     Diet Controlled  . Atrial flutter   . Atrial fibrillation     Past Surgical History: Past Surgical History  Procedure Laterality Date  . Foot surgery      x2  . Tonsillectomy    . Esophagogastroduodenoscopy  02/29/2012    Procedure: ESOPHAGOGASTRODUODENOSCOPY (EGD);  Surgeon: [name], MD;  Location: [facility] ENDOSCOPY;  Service: Endoscopy;  Laterality: N/A;  . Colonoscopy  03/01/2012    Procedure: COLONOSCOPY;  Surgeon: [name], MD;  Location: [facility] ENDOSCOPY;  Service: Endoscopy;  Laterality: N/A;    Social History: History   Social History  . Marital Status: Widowed    Spouse Name: N/A    Number of Children: 1  . Years of Education: N/A   Social History Main Topics  . Smoking status: Former Smoker -- 0.50 packs/day for 20 years    Types: Cigarettes    Quit date: 07/21/2007  . Smokeless tobacco: None  . Alcohol Use: No  .  Drug Use: No  . Sexual Activity: None   Other Topics Concern  . None   Social History Narrative  . None    Family History: Family History  Problem Relation Age of Onset  . COPD Father   . Heart attack Father   . Lung cancer Father   . Breast cancer Mother   . Heart attack Brother      Allergies: No Known Allergies  Current Facility-Administered Medications  Medication Dose Route Frequency Provider Last Rate Last Dose  . acetaminophen (TYLENOL) tablet 650 mg  650 mg Oral Q6H PRN Doree Albee, MD      . Melene Muller ON 04/23/2013] cholecalciferol (VITAMIN D) tablet 1,000 Units  1,000 Units Oral Daily Doree Albee, MD      . Melene Muller ON 04/23/2013] GERITOL COMPLETE TABS 1 tablet  1 tablet Oral q morning - 10a Doree Albee, MD      . heparin injection 5,000 Units  5,000 Units Subcutaneous 3 times per day Doree Albee, MD      . Melene Muller ON 04/23/2013] prenatal multivitamin tablet 1 tablet  1 tablet Oral Q1200 Doree Albee, MD      . sodium chloride 0.9 % injection 3 mL  3 mL Intravenous Q12H Doree Albee, MD      . traMADol Janean Sark) tablet 50-100 mg  50-100 mg Oral Q6H PRN Doree Albee, MD       Current Outpatient Prescriptions  Medication Sig Dispense Refill  . acetaminophen (TYLENOL) 325 MG tablet Take 650 mg by mouth every 6 (six) hours as needed for pain.      . Calcium Carbonate-Vitamin D (CALTRATE 600+D PO) Take by mouth every morning.      . cholecalciferol (VITAMIN D) 1000 UNITS tablet Take 1,000 Units by mouth daily.      . Iron-Vitamins (GERITOL COMPLETE PO) Take by mouth 3 (three) times a week.      . metoprolol (LOPRESSOR) 50 MG tablet Take 50 mg by mouth 2 (two) times daily.      . polyvinyl alcohol (LIQUIFILM TEARS) 1.4 % ophthalmic solution Place 1 drop into both eyes as needed for dry eyes.      . Prenatal Vit-Fe Fumarate-FA (PRENATAL MULTIVITAMIN) TABS tablet Take 1 tablet by mouth daily at 12 noon.      . traMADol (ULTRAM) 50 MG tablet Take 50-100 mg by mouth every 6 (six) hours as needed for moderate pain.       Marland Kitchen warfarin (COUMADIN) 2.5 MG tablet Take 5-7.5 mg by mouth daily. Takes 5mg  on Sunday and Wednesday. Takes 7.5mg  daily all other days       Review Of Systems: 12 point ROS negative except as noted above in HPI.  Physical Exam: Filed Vitals:    04/22/13 2133  BP: 144/68  Pulse:   Temp:   Resp: 20    General: alert and cooperative HEENT: PERRLA and extra ocular movement intact Heart: S1, S2 normal, no murmur, rub or gallop, regular rate and rhythm Lungs: clear to auscultation, no wheezes or rales and unlabored breathing Abdomen: abdomen is soft without significant tenderness, masses, organomegaly or guarding Extremities: extremities normal, atraumatic, no cyanosis or edema Skin:no rashes, no ecchymoses Neurology: normal without focal findings, mental status, speech normal, alert and oriented x3, PERLA and reflexes normal and symmetric  Labs and Imaging: Lab Results  Component Value Date/Time   NA 139 04/22/2013  2:25 PM   K 4.3 04/22/2013  2:25 PM   CL 102 04/22/2013  2:25 PM  CO2 24 04/22/2013  2:25 PM   BUN 14 04/22/2013  2:25 PM   CREATININE 0.80 04/22/2013  2:25 PM   GLUCOSE 216* 04/22/2013  2:25 PM   Lab Results  Component Value Date   WBC 7.9 04/22/2013   HGB 12.4 04/22/2013   HCT 37.4 04/22/2013   MCV 83.9 04/22/2013   PLT 264 04/22/2013    Dg Chest 2 View  04/22/2013   CLINICAL DATA:  Left-sided chest pain since March 15th, history of asthma and atrial fibrillation  EXAM: CHEST  2 VIEW  COMPARISON:  DG CHEST 1V PORT dated 02/28/2012  FINDINGS: The heart size and mediastinal contours are within normal limits. Both lungs are clear. The visualized skeletal structures are unremarkable.  IMPRESSION: No active cardiopulmonary disease.   Electronically Signed   By: Esperanza Heiraymond  Rubner M.D.   On: 04/22/2013 15:35     Assessment and Plan: Brittany Ford is a 68 y.o. year old female presenting with chest pain, dyspnea, fatigue.   Chest Pain: Atypical sxs. Cards consulted. Follow up per formal recs. Cycle CEs. Risk stratification labs. Full dose ASA x1. Likely stress test in am.   Dyspnea/Fatigue: suspect likely secondary to medication effect. No known underlying resp disease. No wheezing on exam today. Hgb WNL. No leg pain  consistent with claudication in setting of PVD. Hold metoprolol. Will also check TSH and vitamin D.    Afib: rate controlled currently, though with intermittent sinus bradycardia in setting of beta blocker use. Coumadin per pharmacy.   DM: SSI. A1C.  FEN/GI: heart healthy diet  Prophylaxis: sub q heparin  Disposition: pending further evaluation  Code Status:Full code.         Doree AlbeeSteven Newton MD  Pager: [phone]

## 2013-04-23 ENCOUNTER — Observation Stay (HOSPITAL_COMMUNITY): Payer: Medicare Other

## 2013-04-23 ENCOUNTER — Encounter (HOSPITAL_COMMUNITY): Payer: Self-pay | Admitting: General Practice

## 2013-04-23 DIAGNOSIS — I739 Peripheral vascular disease, unspecified: Secondary | ICD-10-CM

## 2013-04-23 DIAGNOSIS — I4891 Unspecified atrial fibrillation: Secondary | ICD-10-CM

## 2013-04-23 DIAGNOSIS — I059 Rheumatic mitral valve disease, unspecified: Secondary | ICD-10-CM

## 2013-04-23 DIAGNOSIS — E785 Hyperlipidemia, unspecified: Secondary | ICD-10-CM

## 2013-04-23 DIAGNOSIS — R079 Chest pain, unspecified: Secondary | ICD-10-CM

## 2013-04-23 DIAGNOSIS — E119 Type 2 diabetes mellitus without complications: Secondary | ICD-10-CM

## 2013-04-23 LAB — COMPREHENSIVE METABOLIC PANEL
ALT: 13 U/L (ref 0–35)
AST: 16 U/L (ref 0–37)
Albumin: 2.9 g/dL — ABNORMAL LOW (ref 3.5–5.2)
Alkaline Phosphatase: 69 U/L (ref 39–117)
BUN: 13 mg/dL (ref 6–23)
CO2: 24 mEq/L (ref 19–32)
Calcium: 8.2 mg/dL — ABNORMAL LOW (ref 8.4–10.5)
Chloride: 107 mEq/L (ref 96–112)
Creatinine, Ser: 0.8 mg/dL (ref 0.50–1.10)
GFR calc Af Amer: 86 mL/min — ABNORMAL LOW (ref >90)
GFR calc non Af Amer: 75 mL/min — ABNORMAL LOW (ref >90)
Glucose, Bld: 152 mg/dL — ABNORMAL HIGH (ref 70–99)
Potassium: 4.2 mEq/L (ref 3.7–5.3)
Sodium: 141 mEq/L (ref 137–147)
Total Bilirubin: <0.2 mg/dL — ABNORMAL LOW (ref 0.3–1.2)
Total Protein: 6.1 g/dL (ref 6.0–8.3)

## 2013-04-23 LAB — CBC WITH DIFFERENTIAL/PLATELET
Basophils Absolute: 0 10*3/uL (ref 0.0–0.1)
Basophils Relative: 1 % (ref 0–1)
Eosinophils Absolute: 0.3 10*3/uL (ref 0.0–0.7)
Eosinophils Relative: 4 % (ref 0–5)
HCT: 34 % — ABNORMAL LOW (ref 36.0–46.0)
Hemoglobin: 11 g/dL — ABNORMAL LOW (ref 12.0–15.0)
Lymphocytes Relative: 40 % (ref 12–46)
Lymphs Abs: 2.5 10*3/uL (ref 0.7–4.0)
MCH: 27.4 pg (ref 26.0–34.0)
MCHC: 32.4 g/dL (ref 30.0–36.0)
MCV: 84.8 fL (ref 78.0–100.0)
Monocytes Absolute: 0.4 10*3/uL (ref 0.1–1.0)
Monocytes Relative: 7 % (ref 3–12)
Neutro Abs: 3 10*3/uL (ref 1.7–7.7)
Neutrophils Relative %: 48 % (ref 43–77)
Platelets: 208 10*3/uL (ref 150–400)
RBC: 4.01 MIL/uL (ref 3.87–5.11)
RDW: 14.6 % (ref 11.5–15.5)
WBC: 6.2 10*3/uL (ref 4.0–10.5)

## 2013-04-23 LAB — LIPID PANEL
Cholesterol: 210 mg/dL — ABNORMAL HIGH (ref 0–200)
HDL: 32 mg/dL — ABNORMAL LOW (ref >39)
LDL Cholesterol: 138 mg/dL — ABNORMAL HIGH (ref 0–99)
Total CHOL/HDL Ratio: 6.6 RATIO
Triglycerides: 200 mg/dL — ABNORMAL HIGH (ref <150)
VLDL: 40 mg/dL (ref 0–40)

## 2013-04-23 LAB — PROTIME-INR
INR: 2.14 — ABNORMAL HIGH (ref 0.00–1.49)
Prothrombin Time: 23.2 seconds — ABNORMAL HIGH (ref 11.6–15.2)

## 2013-04-23 LAB — T4, FREE: Free T4: 1.06 ng/dL (ref 0.80–1.80)

## 2013-04-23 LAB — TSH: TSH: 4.635 u[IU]/mL — ABNORMAL HIGH (ref 0.350–4.500)

## 2013-04-23 LAB — CREATININE, SERUM
Creatinine, Ser: 0.8 mg/dL (ref 0.50–1.10)
GFR calc Af Amer: 86 mL/min — ABNORMAL LOW
GFR calc non Af Amer: 75 mL/min — ABNORMAL LOW

## 2013-04-23 LAB — GLUCOSE, CAPILLARY
Glucose-Capillary: 141 mg/dL — ABNORMAL HIGH (ref 70–99)
Glucose-Capillary: 136 mg/dL — ABNORMAL HIGH (ref 70–99)

## 2013-04-23 LAB — TROPONIN I
Troponin I: <0.3 ng/mL
Troponin I: <0.3 ng/mL

## 2013-04-23 LAB — HEMOGLOBIN A1C
Hgb A1c MFr Bld: 7.4 % — ABNORMAL HIGH (ref <5.7)
Mean Plasma Glucose: 166 mg/dL — ABNORMAL HIGH (ref <117)

## 2013-04-23 LAB — T3, FREE: T3, Free: 2.8 pg/mL (ref 2.3–4.2)

## 2013-04-23 MED ORDER — METOPROLOL TARTRATE 25 MG PO TABS: 25.0000 mg | ORAL_TABLET | Freq: Two times a day (BID) | ORAL | Status: DC

## 2013-04-23 MED ORDER — REGADENOSON 0.4 MG/5ML IV SOLN
INTRAVENOUS | Status: AC
  Filled 2013-04-23: qty 5

## 2013-04-23 MED ORDER — TECHNETIUM TC 99M SESTAMIBI GENERIC - CARDIOLITE
30.0000 | Freq: Once | INTRAVENOUS | Status: AC | PRN
  Administered 2013-04-23: 30 via INTRAVENOUS

## 2013-04-23 MED ORDER — WARFARIN - PHARMACIST DOSING INPATIENT: Freq: Every day | Status: DC

## 2013-04-23 MED ORDER — TECHNETIUM TC 99M SESTAMIBI GENERIC - CARDIOLITE
10.0000 | Freq: Once | INTRAVENOUS | Status: AC | PRN
  Administered 2013-04-23: 10 via INTRAVENOUS

## 2013-04-23 MED ORDER — REGADENOSON 0.4 MG/5ML IV SOLN
0.4000 mg | Freq: Once | INTRAVENOUS | Status: AC
  Administered 2013-04-23: 0.4 mg via INTRAVENOUS
  Filled 2013-04-23: qty 5

## 2013-04-23 MED ORDER — ATORVASTATIN CALCIUM 40 MG PO TABS
40.0000 mg | ORAL_TABLET | Freq: Every day | ORAL | Status: DC
  Filled 2013-04-23: qty 1

## 2013-04-23 MED ORDER — WARFARIN SODIUM 5 MG PO TABS
5.0000 mg | ORAL_TABLET | Freq: Once | ORAL | Status: DC
  Filled 2013-04-23: qty 1

## 2013-04-23 NOTE — Progress Notes (Signed)
ANTICOAGULATION CONSULT NOTE - Follow Up Consult  Pharmacy Consult for Coumadin Indication: atrial fibrillation  No Known Allergies  Patient Measurements: Height: 5\' 11"  (180.3 cm) Weight: 238 lb (107.956 kg) IBW/kg (Calculated) : 70.8  Vital Signs: BP: 133/57 mmHg (03/18 1205) Pulse Rate: 64 (03/18 1205)  Labs:  Recent Labs  04/22/13 1425 04/22/13 1606 04/22/13 2149 04/22/13 2306 04/23/13 0436  HGB 12.4  --   --  11.2* 11.0*  HCT 37.4  --   --  34.5* 34.0*  PLT 264  --   --  233 208  APTT  --  48*  --   --   --   LABPROT  --  24.4*  --   --  23.2*  INR  --  2.28*  --   --  2.14*  CREATININE 0.80  --   --  0.80 0.80  TROPONINI  --   --  <0.30 <0.30 <0.30    Estimated Creatinine Clearance: 92.3 ml/min (by C-G formula based on Cr of 0.8).   Medications:  PTA: Coumadin 5mg  on Sun and Wed, 7.5mg  on all other days  Assessment: 68 year old female admitted with chest pain.  She is on chronic anticoagulation with Coumadin for atrial fibrillation and her INR remains therapeutic.  No bleeding complications noted.  Goal of Therapy:  INR 2-3   Plan:  Coumadin 5mg  today - her usual Wednesday dose Daily PT/INR  Discussed with Brittany LisBrittainy Ford with cardiology - OK to continue with Coumadin for now.  Stress test results will be available around 4pm and Cardiology will touch base with Pharmacy at that time if Coumadin needs to stop or be reversed.  Brittany HuskMichelle Ford, Pharm.D., BCPS, AAHIVP Clinical Pharmacist Phone: [phone] or [phone] 04/23/2013, 2:22 PM

## 2013-04-23 NOTE — Discharge Summary (Signed)
Physician Discharge Summary  Brittany PavlovDianne Bethea-Wilson ZOX:[id number] DOB: 06/13/1945 DOA: 04/22/2013  PCP: Gaye AlkenBARNES,ELIZABETH STEWART, MD  Admit date: 04/22/2013 Discharge date: 04/23/2013  Time spent: 35 minutes  Recommendations for Outpatient Follow-up:  1. Need to follow up on HR, metoprolol dose reduced.  2. Labs; that's need to be follow up; free T 3 and T 4.   Discharge Diagnoses:    Chest pain   Hyperlipidemia   Diabetes mellitus   PAF (paroxysmal atrial fibrillation)/atrial flutter   Mitral regurgitation   Discharge Condition: stable.   Diet recommendation: heart healthy  Filed Weights   04/22/13 1423 04/23/13 0004 04/23/13 0545  Weight: 105.235 kg (232 lb) 105.235 kg (232 lb) 107.956 kg (238 lb)    History of present illness:  Chief Complaint: chest pain, palpitations, dyspnea, fatigue  History of Present Illness:This is a 68 y.o. year old female with paroxysmal afib on coumadin, PVD as well as recent admission 02/2012 for GI bleed 2/2 xarelto, diet controlled DM presenting with chest pain and fatigue. Patient states that she's had stabbing left-sided chest pain without radiation it lasts seconds since the weekend. Symptoms occur both at rest as well as with exertion. Pain now exacerbated by deep breathing, coughing, movement. Has had some mild palpitations similar to previous flares of atrial fibrillation. Patient has been using increasing metoprolol dosing to help with this her cardiology records per patient. Has also had progressive dyspnea and fatigue over the past 2-3 months. Patient states that she becomes progressively dyspneic even climb one to 2 flights of stairs. This is brand-new issue. Denies any associated nausea or diaphoresis with symptoms. Patient was brought to the ER today by her daughter because of recurring sharp chest pain. Pt feels that dyspnea and fatigue may be related to increased metoprolol use.  In the ER, EKG with sinus bradycardia. Troponin is within normal  limits. Systolic blood pressure ranging from the 100s to 150s. Lab work otherwise within normal limits. INR therapeutic at 2.28 at the time of my exam, patient says that subsequently resolved and she is ready to go home. Did not receive any nitroglycerin or aspirin. Cards consulted with recommendation for obs admission. Cycle CEs and hold metoprolol.   Cardiovascular studies studies:  Myoview January 2010: Normal study  2D ECHO 02/2013: mild mitral and tricuspid regurg, EF 79%, no reported diastolic dysfunction  Carotid duplex 02/2012: mild bilateral carotid plaque without hemodynamically significant stenosis    Hospital Course:  1-Chest pain; chest pain free. Troponin negative. Myoview with no evidence of reversible ischemia. Patient decline to take medications for cholesterol. Risk and benefit explain to the patient.  2-Hyperlipidemia; LDL at 138. Refuse to take medications. Explain the importance of good cholesterol controlled.  3-Diabetes; HB at 7.4. Refuse to take medications. Needs to follow up with PCP.  4-Bradycardiac; Paroxysmal A fib; metoprolol dose decreased.  5-Increase TSH; free T 3 and T 4 ordered.   Procedures: Myoview; Small area of distal breast attenuation artifact, no reversible  ischemia. Normal wall motion. EF 80%.    Consultations:  Cardiology  Discharge Exam: Filed Vitals:   04/23/13 1205  BP: 133/57  Pulse: 64  Temp:   Resp:     General: No distress.  Cardiovascular: S 1, S 2 RRR Respiratory: CTA  Discharge Instructions      Discharge Orders   Future Appointments Provider Department Dept Phone   06/05/2013 3:00 PM Everette RankJay Varanasi, MD [facility] [address] [phone]   Future Orders Complete By Expires   Diet  Carb Modified  As directed    Increase activity slowly  As directed        Medication List         acetaminophen 325 MG tablet  Commonly known as:  TYLENOL  Take 650 mg by mouth every 6 (six) hours as needed for pain.      CALTRATE 600+D PO  Take by mouth every morning.     cholecalciferol 1000 UNITS tablet  Commonly known as:  VITAMIN D  Take 1,000 Units by mouth daily.     GERITOL COMPLETE PO  Take by mouth 3 (three) times a week.     metoprolol tartrate 25 MG tablet  Commonly known as:  LOPRESSOR  Take 1 tablet (25 mg total) by mouth 2 (two) times daily.     polyvinyl alcohol 1.4 % ophthalmic solution  Commonly known as:  LIQUIFILM TEARS  Place 1 drop into both eyes as needed for dry eyes.     prenatal multivitamin Tabs tablet  Take 1 tablet by mouth daily at 12 noon.     traMADol 50 MG tablet  Commonly known as:  ULTRAM  Take 50-100 mg by mouth every 6 (six) hours as needed for moderate pain.     warfarin 2.5 MG tablet  Commonly known as:  COUMADIN  Take 5-7.5 mg by mouth daily. Takes 5mg  on Sunday and Wednesday. Takes 7.5mg  daily all other days       No Known Allergies Follow-up Information   Follow up with [name], MD In 1 week.   Specialty:  Family Medicine   Contact information:   [address] [place] [address] [phone]       Follow up with Corky Crafts., MD In 3 days.   Specialty:  Cardiology   Contact information:   [address] [place] [address] [phone]        The results of significant diagnostics from this hospitalization (including imaging, microbiology, ancillary and laboratory) are listed below for reference.    Significant Diagnostic Studies: Dg Chest 2 View  04/22/2013   CLINICAL DATA:  Left-sided chest pain since March 15th, history of asthma and atrial fibrillation  EXAM: CHEST  2 VIEW  COMPARISON:  DG CHEST 1V PORT dated 02/28/2012  FINDINGS: The heart size and mediastinal contours are within normal limits. Both lungs are clear. The visualized skeletal structures are unremarkable.  IMPRESSION: No active cardiopulmonary disease.   Electronically Signed   By: Esperanza Heir M.D.   On: 04/22/2013  15:35   Nm Myocar Multi W/spect W/wall Motion / Ef  04/23/2013   HISTORY OF PRESENT ILLNESS: Nuclear Med Background  Indication for Stress Test:  Chest pain  History: 68 y/o AAF, with h/o PAF, on chronic warfarin therapy, DM and HLD, admitted for evaluation of chest pain and exertional dyspnea. Last ischemic eval was a chemical stress test in 2010, that was apparently normal. 2D echo in Jan 2014 revealed normal systolic function with an EF of 79% and mild MR. Pt presented with atypical chest pain but also with complaint of exertional dyspnea + fatigue. She has r/o for MI with negative troponins x 3.  Cardiac Risk Factors:  DM2, HPL  Symptoms:  Chest pain  PROCEDURE: Nuclear Pre-Procedure  Caffeine/Decaff Intake:  NPO After: Midnight.  Lungs:  O2 Stat:  IV 0.9% NS with Angio Cath:  Chest Size (in):  Cup Size:  Height: 5'11"  Weight:  238 lb  BMI: 33.2  Tech Comments:  Nuclear Med Study  1 or 2 day study: 1  Stress Test Type:  Lexiscan  Reading MD: Hilty  Order Authorizing Provider: Herbie Baltimore  Resting Radionuclide: Sestamibi  Resting Radionuclide Dose:  10 mCi  Stress Radionuclide: Sestamibi  Stress Radionuclide Dose:  30 mCi  Stress Protocol  Rest HR: 54  Stress HR: 68  Rest BP: 132/70  Stress BP: 133/90  Exercise Time (min): N/A  METS:  Dose of Adenosine (mg):  Dose of Lexiscan:  0.4 mg  Dose of Atropine (mg):  Dose of Dobutamine:  Stress Test Technologist:  Nuclear Technologist:  Rest Procedure:  Stress Procedure:  Transient Ischemic Dilatation (Normal <1.22): 1.01  Lung/Heart Ratio (Normal <0.45): 0.44  QGS EDV:  77 ml  QGS ESV:  15 ml  LV Ejection Fraction: 80%  Rest ECG: NSR  Stress ECG: unchanged  Raw Data Images:  No motion artifact, some breast shadow is noted  Stress Images:  Faint, distal anterior attenuation artifact  Rest Images: Faint, distal anterior attenuation artifact, fixed, worse than stress  Subtraction (SDS): 0  IMPRESSION: Exercise Capacity: N/A  BP Response: Hypotension with lexiscan, resolved  after a few minutes  Clinical Symptoms:  Shortness of breath, nausea  ECG Impression:  No lexiscan EKG changes  Comparison with Prior Nuclear Study: None  Final Impression:  Small area of distal breast attenuation artifact, no reversible ischemia. Normal wall motion. EF 80%.   Electronically Signed   By: Chrystie Nose   On: 04/23/2013 14:38    Microbiology: No results found for this or any previous visit (from the past 240 hour(s)).   Labs: Basic Metabolic Panel:  Recent Labs Lab 04/22/13 1425 04/22/13 2306 04/23/13 0436  NA 139  --  141  K 4.3  --  4.2  CL 102  --  107  CO2 24  --  24  GLUCOSE 216*  --  152*  BUN 14  --  13  CREATININE 0.80 0.80 0.80  CALCIUM 9.1  --  8.2*   Liver Function Tests:  Recent Labs Lab 04/23/13 0436  AST 16  ALT 13  ALKPHOS 69  BILITOT <0.2*  PROT 6.1  ALBUMIN 2.9*   No results found for this basename: LIPASE, AMYLASE,  in the last 168 hours No results found for this basename: AMMONIA,  in the last 168 hours CBC:  Recent Labs Lab 04/22/13 1425 04/22/13 2306 04/23/13 0436  WBC 7.9 7.0 6.2  NEUTROABS  --   --  3.0  HGB 12.4 11.2* 11.0*  HCT 37.4 34.5* 34.0*  MCV 83.9 84.6 84.8  PLT 264 233 208   Cardiac Enzymes:  Recent Labs Lab 04/22/13 2149 04/22/13 2306 04/23/13 0436  TROPONINI <0.30 <0.30 <0.30   BNP: BNP (last 3 results) No results found for this basename: PROBNP,  in the last 8760 hours CBG:  Recent Labs Lab 04/23/13 0015 04/23/13 0737  GLUCAP 141* 136*       Signed:  Regalado, Belkys A  [facility] 04/23/2013, 4:23 PM

## 2013-04-23 NOTE — Progress Notes (Signed)
Patient Profile: 68 y/o AAF, with h/o PAF, on chronic warfarin therapy, DM and HLD, admitted for evaluation of chest pain and exertional dyspnea. Last ischemic eval was a chemical stress test in 2010, that was apparently normal. 2D echo in Jan 2014 revealed normal systolic function with an EF of 79% and mild MR. Pt presented with atypical chest pain but also with complaint of exertional dyspnea + fatigue. She has r/o for MI with negative troponins x 3.  Lipid panel: Total Cholesterol: 210 Triglycerides: 200 HDL: 32 LDL: 138  Subjective: No further chest pain since admission. Denies SOB.   Objective: Vital signs in last 24 hours: Temp:  [97.7 F (36.5 C)-97.8 F (36.6 C)] 97.8 F (36.6 C) (03/18 0004) Pulse Rate:  [49-56] 55 (03/18 0545) Resp:  [14-22] 18 (03/18 0545) BP: (103-158)/(50-103) 125/55 mmHg (03/18 0545) SpO2:  [93 %-100 %] 96 % (03/18 0545) Weight:  [232 lb (105.235 kg)-238 lb (107.956 kg)] 238 lb (107.956 kg) (03/18 0545)    Intake/Output from previous day: 03/17 0701 - 03/18 0700 In: 1003 [I.V.:1003] Out: -  Intake/Output this shift:    Medications Current Facility-Administered Medications  Medication Dose Route Frequency Provider Last Rate Last Dose  . acetaminophen (TYLENOL) tablet 650 mg  650 mg Oral Q6H PRN Doree Albee, MD      . atorvastatin (LIPITOR) tablet 40 mg  40 mg Oral q1800 Si Raider End, MD      . cholecalciferol (VITAMIN D) tablet 1,000 Units  1,000 Units Oral Daily Doree Albee, MD      . insulin aspart (novoLOG) injection 0-5 Units  0-5 Units Subcutaneous QHS Doree Albee, MD      . insulin aspart (novoLOG) injection 0-9 Units  0-9 Units Subcutaneous TID WC Doree Albee, MD      . prenatal multivitamin tablet 1 tablet  1 tablet Oral Q1200 Doree Albee, MD      . sodium chloride 0.9 % injection 3 mL  3 mL Intravenous Q12H Doree Albee, MD   3 mL at 67/18/15 0018  . traMADol (ULTRAM) tablet 50-100 mg  50-100 mg Oral Q6H PRN Doree Albee, MD        PE: General appearance: alert, cooperative and no distress Lungs: clear to auscultation bilaterally Heart: regular rate and rhythm Extremities: no LEE Pulses: 2+ and symmetric Skin: warm and dry Neurologic: Grossly normal  Lab Results:   Recent Labs  04/22/13 1425 04/22/13 2306 04/23/13 0436  WBC 7.9 7.0 6.2  HGB 12.4 11.2* 11.0*  HCT 37.4 34.5* 34.0*  PLT 264 233 208   BMET  Recent Labs  04/22/13 1425 04/22/13 2306 04/23/13 0436  NA 139  --  141  K 4.3  --  4.2  CL 102  --  107  CO2 24  --  24  GLUCOSE 216*  --  152*  BUN 14  --  13  CREATININE 0.80 0.80 0.80  CALCIUM 9.1  --  8.2*   PT/INR  Recent Labs  04/22/13 1606 04/23/13 0436  LABPROT 24.4* 23.2*  INR 2.28* 2.14*   Cholesterol  Recent Labs  04/22/13 2306  CHOL 210*   Cardiac Panel (last 3 results)  Recent Labs  04/22/13 2149 04/22/13 2306 04/23/13 0436  TROPONINI <0.30 <0.30 <0.30    Assessment/Plan  Active Problems:   Chest pain   Hyperlipidemia   Diabetes mellitus   PAF (paroxysmal atrial fibrillation)/atrial flutter   Mitral regurgitation  Plan:  68 y/o AAF with multiple cardiac risk  factors admitted for evaluation of chest pain and dyspnea.   1. Atypical Chest Pain: currently pain free. Enzymes negative x 3. However, with associated symptoms of fatigue, lightheadedness and progressive dyspnea on exertion, along with multiple cardiac risk factors (obesity, DM, HLD), an exercise NST is recommended for risk stratification. Will complete today.   2. Dyspnea: no resting dyspnea. Only with exertion. Last 2D echo was Jan 2014 which demonstrated normal systolic function with an EF of 79% and mild MR. ? Repeating study to see if there has been any change in systolic function and/ or MR. Will also assess for possible coronary ischemia with a stress test. Hgb shows slight anemia, but is stable at 11.0. TSH pending.  3. Hyperlipidemia: Lipid panel this am demonstrated  an elevated LDL of 138, and low HLD of 32. Triglycerides elevated at 200. 40 mg of Lipitor has been ordered.   4. Diabetes: Uncontrolled. Fasting BG is 152. Hgb A1c pending. She is not on any oral hypoglycemics. ? Metformin therapy. [facility]  MD to follow with further recommendations.    LOS: 1 day   Brittainy M. Sharol HarnessSimmons, PA-C 04/23/2013 9:28 AM . I seen and evaluated the patient this morning along with Boyce MediciBrittany Simmons, PA. I also reviewed the H&P by Dr.End.   I agree that your discomfort in her chest is relatively atypical for angina, however she does have multiple risk factors. She'll be scheduled for a LexiScan Myoview today. I discussed with her the difference a LexiScan and adenosine. She is agreeable to proceed.  Of note is ischemia, would anticipate discharge later on today. If there ischemia on stress test. Would consider cardiac catheterization in the morning.  Marykay LexHARDING,DAVID W, MD

## 2013-04-23 NOTE — Progress Notes (Signed)
UR Completed Brenda Graves-Bigelow, RN,BSN [phone]  

## 2013-04-24 ENCOUNTER — Telehealth: Payer: Self-pay | Admitting: Interventional Cardiology

## 2013-04-24 ENCOUNTER — Other Ambulatory Visit: Payer: Self-pay

## 2013-04-24 NOTE — Telephone Encounter (Signed)
See other telephone note.  

## 2013-04-24 NOTE — Telephone Encounter (Signed)
To Dr. Turner, please advise.  

## 2013-04-24 NOTE — Telephone Encounter (Signed)
Spoke with Brittany Ford and she states they told her at the hospital to hold metoprolol because of Low HR. Brittany Ford states she now sees Dr. Eldridge DaceVaranasi and not Dr. Mayford Knifeurner. Brittany Ford wants to know if she hold medication and wait and see Dr. Eldridge DaceVaranasi on 05/26/13. Brittany Ford feels a whole lot better off the metoprolol. Please advise.

## 2013-04-24 NOTE — Telephone Encounter (Signed)
New problem    Pt was seen in ED and was told not to take her Metoprolol until she speak to nurse. Pt need to speak to nurse asap.

## 2013-04-24 NOTE — Telephone Encounter (Signed)
New problem   Pt need to talk to nurse b/c she went to ER and they told her not to take her Metoprolol 50mg  until she talk to doctor. Please call pt.

## 2013-04-24 NOTE — Telephone Encounter (Signed)
Review of discharge summary says she is supposed to take metoprolol 50mg  1/2 tablet twice daily on dishcarge

## 2013-04-24 NOTE — Telephone Encounter (Signed)
Per Dr. Eldridge DaceVaranasi pt should take metoprolol 50 mg 1/2 tab BID to help keep her out of afib. Pt notified and agreeable. Pt will keep follow up that is scheduled.

## 2013-06-05 ENCOUNTER — Ambulatory Visit (INDEPENDENT_AMBULATORY_CARE_PROVIDER_SITE_OTHER): Payer: Medicare Other | Admitting: Interventional Cardiology

## 2013-06-05 ENCOUNTER — Encounter: Payer: Self-pay | Admitting: Interventional Cardiology

## 2013-06-05 VITALS — BP 130/70 | HR 60 | Ht 71.0 in | Wt 233.0 lb

## 2013-06-05 DIAGNOSIS — R002 Palpitations: Secondary | ICD-10-CM

## 2013-06-05 DIAGNOSIS — I4891 Unspecified atrial fibrillation: Secondary | ICD-10-CM

## 2013-06-05 DIAGNOSIS — I480 Paroxysmal atrial fibrillation: Secondary | ICD-10-CM

## 2013-06-05 NOTE — Progress Notes (Signed)
Patient ID: Brittany Ford, female   DOB: 06/06/1945, 68 y.o.   MRN: [id number] Patient ID: Brittany Ford, female   DOB: 09/02/1945, 68 y.o.   MRN: [id number]    [address] [place], [place]  [address] Phone: [phone] Fax:  [phone]  Date:  06/05/2013   ID:  Brittany Ford, DOB 08/17/1945, MRN [id number]  PCP:  Gaye AlkenBARNES,ELIZABETH STEWART, MD      History of Present Illness: Brittany Ford is a 68 y.o. female who has had AFib and mild valvular regurgitation. SHe had an abnormal ABI and other noninvasive tests. The left ABI was 0.84. The right ABI was 1.0. She does not report pain in the calves with walking. SHe has cold feet bilaterally.   She had paroxysmal atrial fibrillation.  She had a GI bleed with Xarelto.  She now takes Warfarin.  She has not had any bleeding problems.  GI w/u was negative at the time of the bleed.  She still has some PAF sx.  It can be as much as 2-3 x/week.  She has to stop her activites for at least a few minutes to an hour.  SHe will have a headache.    Monitor in Jan 2014 showed 4 episodes of AFib, up to 42 minutes.  She was hospitalized for chest pain. She had a negative nuclear stress test. Her metoprolol was decreased due to some sinus bradycardia.    Wt Readings from Last 3 Encounters:  06/05/13 233 lb (105.688 kg)  04/23/13 238 lb (107.956 kg)  11/26/12 226 lb 1.9 oz (102.567 kg)     Past Medical History  Diagnosis Date  . Chest pain     with typical and atypical qualities  . GERD (gastroesophageal reflux disease)   . Chronic low back pain   . Obesity   . Diabetes mellitus without complication     Diet Controlled  . Atrial flutter   . Atrial fibrillation   . Dysrhythmia     ATRIAL FIBRILATION    Current Outpatient Prescriptions  Medication Sig Dispense Refill  . acetaminophen (TYLENOL) 325 MG tablet Take 650 mg by mouth every 6 (six) hours as needed for pain.      . Calcium Carbonate-Vitamin D  (CALTRATE 600+D PO) Take by mouth every morning.      . cholecalciferol (VITAMIN D) 1000 UNITS tablet Take 1,000 Units by mouth daily.      . Iron-Vitamins (GERITOL COMPLETE PO) Take by mouth 3 (three) times a week.      . metoprolol tartrate (LOPRESSOR) 25 MG tablet Take 1 tablet (25 mg total) by mouth 2 (two) times daily.  60 tablet  0  . polyvinyl alcohol (LIQUIFILM TEARS) 1.4 % ophthalmic solution Place 1 drop into both eyes as needed for dry eyes.      . Prenatal Vit-Fe Fumarate-FA (PRENATAL MULTIVITAMIN) TABS tablet Take 1 tablet by mouth daily at 12 noon.      . traMADol (ULTRAM) 50 MG tablet Take 50-100 mg by mouth every 6 (six) hours as needed for moderate pain.       Marland Kitchen. warfarin (COUMADIN) 2.5 MG tablet Take 5-7.5 mg by mouth daily. Takes 5mg  on Sunday and Wednesday. Takes 7.5mg  daily all other days       No current facility-administered medications for this visit.    Allergies:   No Known Allergies  Social History:  The patient  reports that she quit smoking about 7 years ago. Her smoking use included Cigarettes.  She has a 10 pack-year smoking history. She has never used smokeless tobacco. She reports that she does not drink alcohol or use illicit drugs.   Family History:  The patient's family history includes Breast cancer in her mother; COPD in her father; Heart attack in her brother and father; Lung cancer in her father.   ROS:  Please see the history of present illness.  No nausea, vomiting.  No fevers, chills.  No focal weakness.  No dysuria.    All other systems reviewed and negative.   PHYSICAL EXAM: VS:  BP 130/70  Pulse 60  Ht 5\' 11"  (1.803 m)  Wt 233 lb (105.688 kg)  BMI 32.51 kg/m2 Well nourished, well developed, in no acute distress HEENT: normal Neck: no JVD, no carotid bruits Cardiac:  normal S1, S2; RRR;  Lungs:  clear to auscultation bilaterally, no wheezing, rhonchi or rales Abd: soft, nontender, no hepatomegaly Ext: no edema Skin: warm and dry Neuro:   no  focal abnormalities noted     ASSESSMENT AND PLAN:  1. AFib- NSR for the most part.  Continue metoprolol.  Coumadin for stroke prevention. Can take extra 25 mg when she has an episode.  We discussed antiarrhythmic therapy but she would like to stick with the medications that she is already taking.  She continues to have palpitations that are fast and irregular, but no objective evidence of RVR.  She is hesitant to use a monitor.  She had an episode of presyncope a few months ago.  She thinks it may have ben a slow HR. She could need a pacer.  After much discussion, she will stay on what she is already taking.  She will come to the office or an ECG if she feels palpitations.    2. PAD- Mildly decrease ABI on the left.  No claudication sx.  WIll hold off on Doppler.  3. Mitral regurgitation- mild on prior echocardiogram.  We again discussed the fact that he does not need any surgery for this. She is now having heart failure symptoms. She would not be able to get off of any medications at this was fixed. She is trying to get off of as many medications as possible. 4. Hyperlipidemia: Prior LDL was 161. More recent LDL in early 2014 was 128. She would like to hold off on any new medication at this time. Ideally, I would like to see her LDL less than 100.    Signed, Fredric MareJay S. Varanasi, MD, [facility] 06/05/2013 3:55 PM

## 2013-06-05 NOTE — Patient Instructions (Signed)
Your physician recommends that you continue on your current medications as directed. Please refer to the Current Medication list given to you today.  Your physician wants you to follow-up in: 6 months with Dr. Varanasi.  You will receive a reminder letter in the mail two months in advance. If you don't receive a letter, please call our office to schedule the follow-up appointment.  

## 2013-06-09 ENCOUNTER — Other Ambulatory Visit: Payer: Self-pay | Admitting: Cardiology

## 2013-06-10 ENCOUNTER — Other Ambulatory Visit: Payer: Self-pay

## 2013-06-10 DIAGNOSIS — Z1231 Encounter for screening mammogram for malignant neoplasm of breast: Secondary | ICD-10-CM

## 2013-06-12 ENCOUNTER — Other Ambulatory Visit: Payer: Self-pay

## 2013-06-12 ENCOUNTER — Other Ambulatory Visit: Payer: Self-pay | Admitting: Cardiology

## 2013-06-26 ENCOUNTER — Encounter (INDEPENDENT_AMBULATORY_CARE_PROVIDER_SITE_OTHER): Payer: Self-pay

## 2013-06-26 ENCOUNTER — Ambulatory Visit
Admission: RE | Admit: 2013-06-26 | Discharge: 2013-06-26 | Disposition: A | Discharge status: Home / Self Care | Payer: Medicare Other | Source: Ambulatory Visit

## 2013-06-26 DIAGNOSIS — Z1231 Encounter for screening mammogram for malignant neoplasm of breast: Secondary | ICD-10-CM

## 2013-10-22 ENCOUNTER — Other Ambulatory Visit: Payer: Self-pay | Admitting: Physical Medicine and Rehabilitation

## 2013-10-22 DIAGNOSIS — M5416 Radiculopathy, lumbar region: Secondary | ICD-10-CM

## 2013-10-22 DIAGNOSIS — M545 Low back pain, unspecified: Secondary | ICD-10-CM

## 2013-10-27 ENCOUNTER — Telehealth: Payer: Self-pay | Admitting: Interventional Cardiology

## 2013-10-27 NOTE — Telephone Encounter (Signed)
LMTCB

## 2013-10-27 NOTE — Telephone Encounter (Signed)
New problem:    Per pt she would like to see Dr Katrinka Blazing and has been trying for some time now.   She would like to keep Dr Eldridge Dace for her vains.  She does not want to see Dr Mayford Knife.

## 2013-10-31 ENCOUNTER — Ambulatory Visit
Admission: RE | Admit: 2013-10-31 | Discharge: 2013-10-31 | Disposition: A | Discharge status: Home / Self Care | Payer: Medicare Other | Source: Ambulatory Visit | Attending: Physical Medicine and Rehabilitation | Admitting: Physical Medicine and Rehabilitation

## 2013-10-31 DIAGNOSIS — M545 Low back pain, unspecified: Secondary | ICD-10-CM

## 2013-10-31 DIAGNOSIS — M5416 Radiculopathy, lumbar region: Secondary | ICD-10-CM

## 2013-11-07 NOTE — Telephone Encounter (Signed)
lmtrc

## 2013-11-13 NOTE — Telephone Encounter (Signed)
Pt has appt on 11/21/13. We can discuss with her at OV.

## 2013-11-21 ENCOUNTER — Encounter: Payer: Self-pay | Admitting: Interventional Cardiology

## 2013-11-21 ENCOUNTER — Ambulatory Visit (INDEPENDENT_AMBULATORY_CARE_PROVIDER_SITE_OTHER): Payer: Medicare Other | Admitting: Interventional Cardiology

## 2013-11-21 VITALS — BP 130/62 | HR 58 | Ht 71.0 in | Wt 233.8 lb

## 2013-11-21 DIAGNOSIS — I340 Nonrheumatic mitral (valve) insufficiency: Secondary | ICD-10-CM

## 2013-11-21 DIAGNOSIS — I739 Peripheral vascular disease, unspecified: Secondary | ICD-10-CM

## 2013-11-21 DIAGNOSIS — E785 Hyperlipidemia, unspecified: Secondary | ICD-10-CM

## 2013-11-21 DIAGNOSIS — I480 Paroxysmal atrial fibrillation: Secondary | ICD-10-CM

## 2013-11-21 MED ORDER — APIXABAN 5 MG PO TABS: 5.0000 mg | ORAL_TABLET | Freq: Two times a day (BID) | ORAL | Status: DC

## 2013-11-21 MED ORDER — ATORVASTATIN CALCIUM 10 MG PO TABS: 10.0000 mg | ORAL_TABLET | Freq: Every day | ORAL | Status: DC

## 2013-11-21 NOTE — Progress Notes (Signed)
Patient ID: Brittany Ford, female   DOB: 11/26/1945, 68 y.o.   MRN: [id number] Patient ID: Brittany Ford, female   DOB: 04/08/1945, 68 y.o.   MRN: [id number]     [address] [place], [place]  [address] Phone: [phone] Fax:  [phone]  Date:  11/21/2013   ID:  Brittany Ford, DOB 07/26/1945, MRN [id number]  PCP:  Gaye AlkenBARNES,ELIZABETH STEWART, MD      History of Present Illness: Brittany Ford is a 68 y.o. female who has had AFib and mild valvular regurgitation. SHe had an abnormal ABI and other noninvasive tests. The left ABI was 0.84. The right ABI was 1.0. She does not report pain in the calves with walking. SHe has cold feet bilaterally.   She had paroxysmal atrial fibrillation. Events occur in the evening.  She had a GI bleed with Xarelto.  She now takes Warfarin.  She has not had any bleeding problems.  GI w/u was negative at the time of the bleed.  She still has some PAF sx.  It can be as much as 2-3 x/week.  She has to stop her activites for at least a few minutes to an hour.  SHe will have a headache.    Monitor in Jan 2014 showed 4 episodes of AFib, up to 42 minutes.  She was hospitalized for chest pain. She had a negative nuclear stress test. Her metoprolol was decreased due to some sinus bradycardia.  Limited more by knee pain and back pain.  May need a back operation and be of of coumadin for 5 days.    Wt Readings from Last 3 Encounters:  11/21/13 233 lb 12.8 oz (106.051 kg)  06/05/13 233 lb (105.688 kg)  04/23/13 238 lb (107.956 kg)     Past Medical History  Diagnosis Date  . Chest pain     with typical and atypical qualities  . GERD (gastroesophageal reflux disease)   . Chronic low back pain   . Obesity   . Diabetes mellitus without complication     Diet Controlled  . Atrial flutter   . Atrial fibrillation   . Dysrhythmia     ATRIAL FIBRILATION    Current Outpatient Prescriptions  Medication Sig Dispense Refill  .  acetaminophen (TYLENOL) 325 MG tablet Take 650 mg by mouth every 6 (six) hours as needed for pain.      . metoprolol tartrate (LOPRESSOR) 25 MG tablet Take 12.5 mg by mouth 2 (two) times daily.      . traMADol (ULTRAM) 50 MG tablet Take 50-100 mg by mouth every 6 (six) hours as needed for moderate pain.       Marland Kitchen. warfarin (COUMADIN) 2.5 MG tablet Take as directed by coumadin clinic  30 tablet  0   No current facility-administered medications for this visit.    Allergies:    Allergies  Allergen Reactions  . Bee Venom     Social History:  The patient  reports that she quit smoking about 8 years ago. Her smoking use included Cigarettes. She has a 10 pack-year smoking history. She has never used smokeless tobacco. She reports that she does not drink alcohol or use illicit drugs.   Family History:  The patient's family history includes Breast cancer in her mother; COPD in her father; Heart attack in her brother and father; Lung cancer in her father.   ROS:  Please see the history of present illness.  No nausea, vomiting.  No fevers, chills.  No  focal weakness.  No dysuria.    All other systems reviewed and negative.   PHYSICAL EXAM: VS:  BP 130/62  Pulse 58  Ht 5\' 11"  (1.803 m)  Wt 233 lb 12.8 oz (106.051 kg)  BMI 32.62 kg/m2  SpO2 97% Well nourished, well developed, in no acute distress HEENT: normal Neck: no JVD, no carotid bruits Cardiac:  normal S1, S2; RRR;  Lungs:  clear to auscultation bilaterally, no wheezing, rhonchi or rales Abd: soft, nontender, no hepatomegaly Ext: no edema, difficult to palpate pedal pulses bilaterally Skin: warm and dry Neuro:   no focal abnormalities noted     ASSESSMENT AND PLAN:  1. AFib- NSR for the most part.  She reports a few episodes, but hey are shortlived and do not limit her.  Continue metoprolol.  Can take extra 25 mg when she has an episode.  Coumadin for stroke prevention, but she wants to switch to Eliquis to avoid the INR checks and to  allow her to eat greens to help her DM. We discussed antiarrhythmic therapy but she would like to stick with the medications that she is already taking.  She continues to have palpitations that are fast and irregular, but no objective evidence of RVR.  She is hesitant to use a monitor.  After much discussion, she will stay on Metoprolol instead of flecainide.  She will come to the office or an ECG if she feels palpitations.    Will switch Coumadin to Eliquis.    2. PAD- Mildly decrease ABI on the left.  No claudication sx.  WIll hold off on Doppler.  3. Mitral regurgitation- mild on prior echocardiogram.  We again discussed the fact that he does not need any surgery for this. She is now having heart failure symptoms. She would not be able to get off of any medications at this was fixed. She is trying to get off of as many medications as possible. 4. Hyperlipidemia: Prior LDL was 161. More recent LDL in early 2014 was 128. She would like to hold off on any new medication at this time. Ideally, I would like to see her LDL less than 100.  3/15: LDL 138.  I think she should take a statin.  She is reluctant but will start atorvastatin 10 mg daily.  Lipids and liver test in 3 months.    5. If she requires a back operation, she can be off of coumadin for 5 days.     Signed, Fredric MareJay S. Varanasi, MD, [facility] 11/21/2013 8:35 AM

## 2013-11-21 NOTE — Patient Instructions (Signed)
Your physician wants you to follow-up in:   6 MONTHS WITH  DR  Eldridge DaceVARANASI  You will receive a reminder letter in the mail two months in advance. If you don't receive a letter, please call our office to schedule the follow-up appointment. Your physician has recommended you make the following change in your medication:  STOP  WARFARIN START  ELIQUIS 5 MG  TWICE DAILY ATORVASTATIN  10 MG  Your physician recommends that you return for lab work in:  3 MONTHS  FASTING  LIPID LIVER  DUE MID  [facility]  2016

## 2014-02-19 ENCOUNTER — Ambulatory Visit (INDEPENDENT_AMBULATORY_CARE_PROVIDER_SITE_OTHER): Payer: Medicare Other | Admitting: Interventional Cardiology

## 2014-02-19 ENCOUNTER — Encounter: Payer: Self-pay | Admitting: Interventional Cardiology

## 2014-02-19 VITALS — BP 120/60 | HR 57 | Ht 71.0 in | Wt 232.0 lb

## 2014-02-19 DIAGNOSIS — I739 Peripheral vascular disease, unspecified: Secondary | ICD-10-CM

## 2014-02-19 DIAGNOSIS — Z01810 Encounter for preprocedural cardiovascular examination: Secondary | ICD-10-CM

## 2014-02-19 DIAGNOSIS — E785 Hyperlipidemia, unspecified: Secondary | ICD-10-CM

## 2014-02-19 DIAGNOSIS — I480 Paroxysmal atrial fibrillation: Secondary | ICD-10-CM

## 2014-02-19 NOTE — Patient Instructions (Signed)
Your physician recommends that you continue on your current medications as directed. Please refer to the Current Medication list given to you today.  Your physician wants you to follow-up in: 6 motnhs with Dr. Eldridge DaceVaranasi. You will receive a reminder letter in the mail two months in advance. If you don't receive a letter, please call our office to schedule the follow-up appointment.

## 2014-02-19 NOTE — Progress Notes (Signed)
Patient ID: Brittany Ford, female   DOB: [birth year], 69 y.o.   MRN: [id number]      [address] [place], [place]  [address] Phone: [phone] Fax:  [phone]  Date:  02/19/2014   ID:  Brittany Ford, DOB 1945/03/04, MRN [id number]  PCP:  [name], MD      History of Present Illness: Brittany Ford is a 69 y.o. female who has had AFib and mild valvular regurgitation. SHe had an abnormal ABI and other noninvasive tests. The left ABI was 0.84. The right ABI was 1.0. She does not report pain in the calves with walking. SHe has cold feet bilaterally.   She had paroxysmal atrial fibrillation. Events occur in the evening.  She had a GI bleed with Xarelto.  She now takes Warfarin.  She has not had any bleeding problems.  GI w/u was negative at the time of the bleed.  She still has some PAF sx-sx mostly at night.  It can be as much as 1-2 x/week.  She is usually lying in bed when she feels it- she is not convinced that it is AFib.  SHe will have a headache.    Monitor in Jan 2014 showed 4 episodes of AFib, up to 42 minutes.  She was hospitalized for chest pain. She had a negative nuclear stress test. Her metoprolol was decreased due to some sinus bradycardia.  Limited more by knee pain and back pain.  She needs a back procedure and has to be off of coumadin for 5 days.  She also has some neck pain that may require attention.  She needs two ingrown toe nails operated on will have to be off of Coumadin for that as well.      Wt Readings from Last 3 Encounters:  02/19/14 232 lb (105.235 kg)  11/21/13 233 lb 12.8 oz (106.051 kg)  06/05/13 233 lb (105.688 kg)     Past Medical History  Diagnosis Date  . Chest pain     with typical and atypical qualities  . GERD (gastroesophageal reflux disease)   . Chronic low back pain   . Obesity   . Diabetes mellitus without complication     Diet Controlled  . Atrial flutter   . Atrial fibrillation   .  Dysrhythmia     ATRIAL FIBRILATION    Current Outpatient Prescriptions  Medication Sig Dispense Refill  . acetaminophen (TYLENOL) 325 MG tablet Take 650 mg by mouth every 6 (six) hours as needed for pain.    Marland Kitchen atorvastatin (LIPITOR) 10 MG tablet Take 1 tablet (10 mg total) by mouth daily. 90 tablet 3  . metoprolol tartrate (LOPRESSOR) 25 MG tablet Take 12.5 mg by mouth 2 (two) times daily.    . traMADol (ULTRAM) 50 MG tablet Take 50-100 mg by mouth every 6 (six) hours as needed for moderate pain.      No current facility-administered medications for this visit.    Allergies:    Allergies  Allergen Reactions  . Bee Venom Anaphylaxis    Social History:  The patient  reports that she quit smoking about 8 years ago. Her smoking use included Cigarettes. She has a 10 pack-year smoking history. She has never used smokeless tobacco. She reports that she does not drink alcohol or use illicit drugs.   Family History:  The patient's family history includes Breast cancer in her mother; COPD in her father; Heart attack in her brother and father; Lung cancer in her  father.   ROS:  Please see the history of present illness.  No nausea, vomiting.  No fevers, chills.  No focal weakness.  No dysuria.    All other systems reviewed and negative.   PHYSICAL EXAM: VS:  BP 120/60 mmHg  Pulse 57  Ht 5\' 11"  (1.803 m)  Wt 232 lb (105.235 kg)  BMI 32.37 kg/m2 Well nourished, well developed, in no acute distress HEENT: normal Neck: no JVD, no carotid bruits Cardiac:  normal S1, S2; RRR; premature beats Lungs:  clear to auscultation bilaterally, no wheezing, rhonchi or rales Abd: soft, nontender, no hepatomegaly Ext: no edema, difficult to palpate pedal pulses bilaterally Skin: warm and dry Neuro:   no focal abnormalities noted Psych: normal affect   NSR, NSST  ASSESSMENT AND PLAN:  1. AFib- NSR for the most part.  She reports a few episodes, but hey are shortlived and do not limit her.  Continue  metoprolol.  Can take extra 25 mg when she has an episode.  She has not had to use any extra metoprolol.  Coumadin for stroke prevention; did not qualify for free Eliquis.. We discussed antiarrhythmic therapy but she would like to stick with the medications that she is already taking.  She continues to have palpitations that are fast and irregular, but no objective evidence of RVR.  She is hesitant to use a monitor.  After much discussion, she will stay on Metoprolol instead of flecainide.  She will come to the office or an ECG if she feels palpitations.    She did not switch Coumadin to Eliquis due to cost.    2. PAD- Mildly decrease ABI on the left.  No claudication sx.  Will hold off on Doppler. Current back and leg pains do not sound like claudication. 3. Mitral regurgitation- mild on prior echocardiogram.  4. Hyperlipidemia: Prior LDL was 161. More recent LDL in early 2014 was 128. She would like to hold off on any new medication at this time. Ideally, I would like to see her LDL less than 100.  3/15: LDL 138.  I think she should take a statin.  She is reluctant but will start atorvastatin 10 mg daily.  Lipids and liver test in 3 months.    5. Preoperative evaluation: She will have a back operation, she can be off of coumadin for 5-7 days prior to both her back procedure as well as her foot procedure.. No further cardiac testing required before her back procedure or her foot procedure.  She had a negative nuclear stress test in March 2015.    Signed, Fredric MareJay S. Varanasi, MD, [facility] 02/19/2014 8:28 AM

## 2014-03-03 ENCOUNTER — Telehealth: Payer: Self-pay | Admitting: Interventional Cardiology

## 2014-03-03 NOTE — Telephone Encounter (Signed)
New Msg     Request for surgical clearance:  1. What type of surgery is being performed? Foot Surgery   2. When is this surgery scheduled? 03/20/14   3. Are there any medications that need to be held prior to surgery and how long? Hold Coumadin, pt isn't exactly sure of how many days it should be held    4. Name of physician performing surgery? Dr. Theodosia PalingPetry   5. What is your office phone and fax number? Office [phone] pt doesn't know fax number   Pt states she needs this information sent to Dr. Theodosia PalingPetry before she can have surgery.  Pt would also like a call back to confirm this has been completed.

## 2014-03-03 NOTE — Telephone Encounter (Signed)
Copy of Dr. Hoyle BarrVaranasi's last office note from 02/19/14 faxed to Dr. Dorisann FramesPetery's office at [phone]. Dr. Eldridge DaceVaranasi addressed holding coumadin and that no further cardiac workup is needed prior to surgery.  Confirmation received and the patient is aware.

## 2014-07-15 ENCOUNTER — Other Ambulatory Visit: Payer: Self-pay

## 2014-07-15 DIAGNOSIS — Z1231 Encounter for screening mammogram for malignant neoplasm of breast: Secondary | ICD-10-CM

## 2014-07-24 ENCOUNTER — Ambulatory Visit
Admission: RE | Admit: 2014-07-24 | Discharge: 2014-07-24 | Disposition: A | Discharge status: Home / Self Care | Payer: Medicare Other | Source: Ambulatory Visit

## 2014-07-24 DIAGNOSIS — Z1231 Encounter for screening mammogram for malignant neoplasm of breast: Secondary | ICD-10-CM

## 2014-07-27 ENCOUNTER — Telehealth: Payer: Self-pay | Admitting: Interventional Cardiology

## 2014-07-27 NOTE — Telephone Encounter (Signed)
Called and spoke with pt and she states that she had asked to speak with Misty Stanley, Dr. Michaelle Copas nurse and would like for her to call her back. Informed pt that I would route this information to [facility] for follow up.

## 2014-07-27 NOTE — Telephone Encounter (Signed)
New Message   Pt wants RN to return the call   She did not want to tell me why she wanted RN to call her back

## 2014-07-28 NOTE — Telephone Encounter (Signed)
Pt would like to switch providers from Dr.Varanasi to Dr.Smith. Message routed to Dr.Smith and Dr.Varansi to advise

## 2014-07-28 NOTE — Telephone Encounter (Signed)
Okay with me if okay with Dr. Varanasi 

## 2014-07-29 NOTE — Telephone Encounter (Signed)
OK with me.

## 2014-07-29 NOTE — Telephone Encounter (Signed)
Returned pt call.[email or web address] 

## 2014-08-02 NOTE — Telephone Encounter (Signed)
Arrange appropriate f/u based on last visit with JV

## 2014-08-03 NOTE — Telephone Encounter (Signed)
2nd attempt to reach pt. Per Dr.V and Dr.Smith. Ok for pt to switch providers. Pt should call back to schedule a f/u appt with Dr.Smith.

## 2014-08-05 ENCOUNTER — Other Ambulatory Visit: Payer: Self-pay | Admitting: Family Medicine

## 2014-08-05 DIAGNOSIS — R0989 Other specified symptoms and signs involving the circulatory and respiratory systems: Secondary | ICD-10-CM

## 2014-08-06 ENCOUNTER — Other Ambulatory Visit: Payer: Self-pay | Admitting: Interventional Cardiology

## 2014-08-21 ENCOUNTER — Other Ambulatory Visit: Payer: Medicare Other

## 2014-10-12 NOTE — Progress Notes (Signed)
Cardiology Office Note   Date:  10/13/2014   ID:  Brittany Ford, DOB 1945/02/23, MRN [id number]  PCP:  [name], MD  Cardiologist:  [name], MD   Chief Complaint  Patient presents with  . Atrial Fibrillation      History of Present Illness: Brittany Ford is a 69 y.o. female who presents for paroxysmal atrial fibrillation and flutter, diabetes mellitus without complications, atypical chest pain, excessive daytime sleepiness, snoring, and chronic anticoagulation therapy.  The patient has a history of atrial fibrillation since 2013. She has moderate exertional dyspnea. She denies orthopnea. She has never had syncope. She never had rhythm control. She does admit to being on metoprolol and on reviewing records has had good rate control.  No history of neurological problems. She has diabetes mellitus with known, palpitations. There is a history of obesity and probable undiagnosed sleep apnea.     Past Medical History  Diagnosis Date  . Chest pain     with typical and atypical qualities  . GERD (gastroesophageal reflux disease)   . Chronic low back pain   . Obesity   . Diabetes mellitus without complication     Diet Controlled  . Atrial flutter   . Atrial fibrillation   . Dysrhythmia     ATRIAL FIBRILATION    Past Surgical History  Procedure Laterality Date  . Foot surgery      x2  . Tonsillectomy    . Esophagogastroduodenoscopy  02/29/2012    Procedure: ESOPHAGOGASTRODUODENOSCOPY (EGD);  Surgeon: [name], MD;  Location: [facility] ENDOSCOPY;  Service: Endoscopy;  Laterality: N/A;  . Colonoscopy  03/01/2012    Procedure: COLONOSCOPY;  Surgeon: [name], MD;  Location: [facility] ENDOSCOPY;  Service: Endoscopy;  Laterality: N/A;     Current Outpatient Prescriptions  Medication Sig Dispense Refill  . acetaminophen (TYLENOL) 325 MG tablet Take 650 mg by mouth every 6 (six) hours as needed for pain.    Marland Kitchen atorvastatin (LIPITOR) 10 MG tablet  Take 1 tablet (10 mg total) by mouth daily. 90 tablet 3  . bacitracin ophthalmic ointment Place 1 application into both eyes at bedtime.  0  . furosemide (LASIX) 20 MG tablet Take 20 mg by mouth daily. Can take one (1) extra dose (20 mg total) by mouth as needed for swelling.  0  . metFORMIN (GLUCOPHAGE) 500 MG tablet Take 500 mg by mouth 2 (two) times daily.  2  . metoprolol (LOPRESSOR) 50 MG tablet TAKE 1 TABLET BY MOUTH TWICE A DAY, CAN TAKE AN EXTRA 25 MG (1/2 TABLET) AS NEEDED FOR AFIB EPISODES 270 tablet 2  . traMADol (ULTRAM) 50 MG tablet Take 50-100 mg by mouth every 6 (six) hours as needed for moderate pain.     Marland Kitchen warfarin (COUMADIN) 2.5 MG tablet Take three (3) tablets (7.5 mg total) by mouth on MONDAYS and THURSDAYS; Take four (4) tablets (10 mg total) by mouth the other days.  0  . apixaban (ELIQUIS) 5 MG TABS tablet Take 1 tablet (5 mg total) by mouth 2 (two) times daily. 60 tablet 11   No current facility-administered medications for this visit.    Allergies:   Bee venom    Social History:  The patient  reports that she quit smoking about 9 years ago. Her smoking use included Cigarettes. She has a 10 pack-year smoking history. She has never used smokeless tobacco. She reports that she does not drink alcohol or use illicit drugs.   Family  History:  The patient's family history includes Breast cancer in her mother; COPD in her father; Heart attack in her brother and father; Heart disease in her sister; Lung cancer in her father.    ROS:  Please see the history of present illness.   Otherwise, review of systems are positive for snoring, excessive daytime sleepiness, fatigue, dizziness, depression, chronic back pain, lower extremity swelling, and sweating..   All other systems are reviewed and negative.    PHYSICAL EXAM: VS:  BP 118/66 mmHg  Pulse 64  Ht 5\' 10"  (1.778 m)  Wt 101.207 kg (223 lb 1.9 oz)  BMI 32.01 kg/m2  SpO2 95% , BMI Body mass index is 32.01 kg/(m^2). GEN:  Well nourished, well developed, in no acute distress HEENT: normal Neck: no JVD, carotid bruits, or masses Cardiac: IIRR; 2/6 systolic murmur at the apex; rubs, or gallops, 1+ bilateral lower extremity  edema  Respiratory:  clear to auscultation bilaterally, normal work of breathing GI: soft, nontender, nondistended, + BS MS: no deformity or atrophy Skin: warm and dry, no rash Neuro:  Strength and sensation are intact Psych: euthymic mood, full affect   EKG:  EKG is not ordered today.    Recent Labs: No results found for requested labs within last 365 days.    Lipid Panel    Component Value Date/Time   CHOL 210* 04/22/2013 2306   TRIG 200* 04/22/2013 2306   HDL 32* 04/22/2013 2306   CHOLHDL 6.6 04/22/2013 2306   VLDL 40 04/22/2013 2306   LDLCALC 138* 04/22/2013 2306      Wt Readings from Last 3 Encounters:  10/13/14 101.207 kg (223 lb 1.9 oz)  02/19/14 105.235 kg (232 lb)  11/21/13 106.051 kg (233 lb 12.8 oz)      Other studies Reviewed: Additional studies/ records that were reviewed today include: Reviewed prior records including those from Dr. Danella Deis. Review of the above records demonstrates: Heart EKGs were reviewed   ASSESSMENT AND PLAN: 1. PAF (paroxysmal atrial fibrillation)/atrial flutter CHADS-VASC 2.  2. Hyperlipidemia Followed by her primary physician, Dr. Juluis Rainier  3. Mitral regurgitation Clinically not significant  4. Bilateral lower extremity edema Likely a combination of venous insufficiency and possibly pulmonary hypertension related to sleep apnea.  5. Excessive daytime sleepiness and snoring We need to rule out sleep apnea. Apparently a sleep study as already been ordered.  6. Preoperative cardiovascular exam Would be okay to discontinue anticoagulation as appropriate prior to upcoming ophthalmologic/plastic surgical procedure.   Current medicines are reviewed at length with the patient today.  The patient has concerns  regarding medicines.  The following changes have been made:  No change in the medical therapy at this time. We will attempt to convert the patient from Coumadin to Eliquis. She feels that she may not be able to afford the medicine but is tired of using Coumadin. She should not switched Eliquis until after the upcoming plastic surgical procedure by Dr. Dione Booze.  A sleep study as already been arranged by her primary physician, Dr. Zachery Dauer.  Labs/ tests ordered today include:  No orders of the defined types were placed in this encounter.     Disposition:   FU with HS in 6 months  Signed, [name], MD  10/13/2014 4:15 PM    [facility] [address] [place], [place], [place]  [address] Phone: [phone]; Fax: [phone]

## 2014-10-13 ENCOUNTER — Encounter: Payer: Self-pay | Admitting: Interventional Cardiology

## 2014-10-13 ENCOUNTER — Ambulatory Visit (INDEPENDENT_AMBULATORY_CARE_PROVIDER_SITE_OTHER): Payer: Medicare Other | Admitting: Interventional Cardiology

## 2014-10-13 VITALS — BP 118/66 | HR 64 | Ht 70.0 in | Wt 223.1 lb

## 2014-10-13 DIAGNOSIS — I340 Nonrheumatic mitral (valve) insufficiency: Secondary | ICD-10-CM | POA: Diagnosis not present

## 2014-10-13 DIAGNOSIS — Z7901 Long term (current) use of anticoagulants: Secondary | ICD-10-CM

## 2014-10-13 DIAGNOSIS — E785 Hyperlipidemia, unspecified: Secondary | ICD-10-CM

## 2014-10-13 DIAGNOSIS — I480 Paroxysmal atrial fibrillation: Secondary | ICD-10-CM | POA: Diagnosis not present

## 2014-10-13 DIAGNOSIS — Z01810 Encounter for preprocedural cardiovascular examination: Secondary | ICD-10-CM

## 2014-10-13 DIAGNOSIS — G4719 Other hypersomnia: Secondary | ICD-10-CM

## 2014-10-13 DIAGNOSIS — R600 Localized edema: Secondary | ICD-10-CM

## 2014-10-13 MED ORDER — APIXABAN 5 MG PO TABS
5.0000 mg | ORAL_TABLET | Freq: Two times a day (BID) | ORAL | Status: DC
Start: 2014-10-13 — End: 2015-10-28

## 2014-10-13 NOTE — Patient Instructions (Signed)
Medication Instructions:  Your physician has recommended you make the following change in your medication: 1)STOP Coumadin after your upcoming surgery 2)START Eliquis  twice daily  Labwork: None orderd  Testing/Procedures: None ordered  Follow-Up: Your physician wants you to follow-up in: 6 months with Dr.Smith You will receive a reminder letter in the mail two months in advance. If you don't receive a letter, please call our office to schedule the follow-up appointment.   Any Other Special Instructions Will Be Listed Below (If Applicable).

## 2014-10-14 ENCOUNTER — Telehealth: Payer: Self-pay

## 2014-10-14 NOTE — Telephone Encounter (Signed)
Eliquis approved through 10/14/2015. PA- [id number]. Pharmacy notified.

## 2014-10-14 NOTE — Telephone Encounter (Signed)
Prior auth for Eliquis 5mg sent to [facility] via Fax. 

## 2014-10-14 NOTE — Telephone Encounter (Signed)
PA for Eliquis 5 mg sent

## 2015-04-19 ENCOUNTER — Telehealth: Payer: Self-pay | Admitting: Interventional Cardiology

## 2015-04-19 ENCOUNTER — Ambulatory Visit: Payer: Medicare Other | Admitting: Interventional Cardiology

## 2015-04-19 NOTE — Telephone Encounter (Signed)
Calling to see if she is needing to stay on the Eliquis and what can she take for her allergies.   Thanks

## 2015-04-19 NOTE — Telephone Encounter (Signed)
Returned pt call. Adv her that she should continue Eliquis as prescribed. Adv her that she can Take OTC plain Claritin, Zyrtec with the "D", or Mucinex for congestion also without  Pt voiced appreciation for the call back and verbalized understanding.

## 2015-05-01 ENCOUNTER — Other Ambulatory Visit: Payer: Self-pay | Admitting: Interventional Cardiology

## 2015-06-15 ENCOUNTER — Other Ambulatory Visit: Payer: Self-pay

## 2015-06-15 DIAGNOSIS — Z1231 Encounter for screening mammogram for malignant neoplasm of breast: Secondary | ICD-10-CM

## 2015-07-14 NOTE — Progress Notes (Addendum)
Cardiology Office Note    Date:  07/15/2015   ID:  Brittany Ford, DOB [birth year], MRN [id number]  PCP:  Brittany Alken, MD  Cardiologist: Brittany Noe, MD   Chief Complaint  Patient presents with  . Atrial Fibrillation    History of Present Illness:  Brittany Ford is a 70 y.o. female is being seen for the first time for paroxysmal atrial fibrillation follow-up in longitudinal management. I am unable to find any particular documentation of atrial fibrillation on the [facility] database. She has been previously cared for by [name] at [facility] cardiology.  The patient has no specific complaints today. She carries diagnoses of atrial fibrillation and atrial flutter. She feels that perhaps twice per week she has episodes of recurrent palpitations. This causes her to feel weak, have racing palpitations, and to develop shortness of breath. It takes away her energy and interrupts her daily activities. Episodes can last up to 4 hours. There are no episodes of syncope. She denies chest pain.  Database includes normal LV function by both echo and nuclear perfusion imaging over the past 2 years.    Past Medical History  Diagnosis Date  . Chest pain     with typical and atypical qualities  . GERD (gastroesophageal reflux disease)   . Chronic low back pain   . Obesity   . Diabetes mellitus without complication (HCC)     Diet Controlled  . Atrial flutter (HCC)   . Atrial fibrillation (HCC)   . Dysrhythmia     ATRIAL FIBRILATION    Past Surgical History  Procedure Laterality Date  . Foot surgery      x2  . Tonsillectomy    . Esophagogastroduodenoscopy  02/29/2012    Procedure: ESOPHAGOGASTRODUODENOSCOPY (EGD);  Surgeon: [name], MD;  Location: [facility] ENDOSCOPY;  Service: Endoscopy;  Laterality: N/A;  . Colonoscopy  03/01/2012    Procedure: COLONOSCOPY;  Surgeon: [name], MD;  Location: [facility] ENDOSCOPY;  Service: Endoscopy;  Laterality: N/A;     Current Medications: Outpatient Prescriptions Prior to Visit  Medication Sig Dispense Refill  . acetaminophen (TYLENOL) 325 MG tablet Take 650 mg by mouth every 6 (six) hours as needed for pain.    Marland Kitchen apixaban (ELIQUIS) 5 MG TABS tablet Take 1 tablet (5 mg total) by mouth 2 (two) times daily. 60 tablet 11  . atorvastatin (LIPITOR) 10 MG tablet Take 1 tablet (10 mg total) by mouth daily. 90 tablet 3  . bacitracin ophthalmic ointment Place 1 application into both eyes at bedtime.  0  . furosemide (LASIX) 20 MG tablet Take 20 mg by mouth daily. Can take one (1) extra dose (20 mg total) by mouth as needed for swelling.  0  . metFORMIN (GLUCOPHAGE) 500 MG tablet Take 500 mg by mouth 2 (two) times daily.  2  . metoprolol (LOPRESSOR) 50 MG tablet TAKE 1 TABLET BY MOUTH TWICE A DAY, CAN TAKE AN EXTRA 25 MG (1/2 TABLET) AS NEEDED FOR AFIB EPISODES 270 tablet 2  . traMADol (ULTRAM) 50 MG tablet Take 50-100 mg by mouth every 6 (six) hours as needed for moderate pain.     Marland Kitchen warfarin (COUMADIN) 2.5 MG tablet Take three (3) tablets (7.5 mg total) by mouth on MONDAYS and THURSDAYS; Take four (4) tablets (10 mg total) by mouth the other days.  0   No facility-administered medications prior to visit.     Allergies:   Bee venom   Social History  Social History  . Marital Status: Widowed    Spouse Name: N/A  . Number of Children: 1  . Years of Education: N/A   Social History Main Topics  . Smoking status: Former Smoker -- 0.50 packs/day for 20 years    Types: Cigarettes    Quit date: 07/20/2005  . Smokeless tobacco: Never Used  . Alcohol Use: No  . Drug Use: No  . Sexual Activity: Not Asked   Other Topics Concern  . None   Social History Narrative     Family History:  The patient's family history includes Breast cancer in her mother; COPD in her father; Heart attack in her brother and father; Heart disease in her sister; Lung cancer in her father.   ROS:   Please see the history of  present illness.    Back pain, depression, easy bruising, anxiety, difficulty with balance, headaches, excessive fatigue, and frequent irregular heart beat.  All other systems reviewed and are negative.   PHYSICAL EXAM:   VS:  BP 150/76 mmHg  Pulse 54  Ht  (1.803 m)  Wt 220 lb (99.791 kg)  BMI 30.70 kg/m2   GEN: Well nourished, well developed, in no acute distress HEENT: normal Neck: no JVD, carotid bruits, or masses Cardiac: RRR; no murmurs, rubs, or gallops,no edema  Respiratory:  clear to auscultation bilaterally, normal work of breathing GI: soft, nontender, nondistended, + BS MS: no deformity or atrophy Skin: warm and dry, no rash Neuro:  Alert and Oriented x 3, Strength and sensation are intact Psych: euthymic mood, full affect  Wt Readings from Last 3 Encounters:  07/15/15 220 lb (99.791 kg)  10/13/14 223 lb 1.9 oz (101.207 kg)  02/19/14 232 lb (105.235 kg)      Studies/Labs Reviewed:   EKG:  EKG  Is not repeated. Most recent tracing was done in February 2017 and demonstrated. There is sinus bradycardia.  Recent Labs: No results found for requested labs within last 365 days.   Lipid Panel    Component Value Date/Time   CHOL 210* 04/22/2013 2306   TRIG 200* 04/22/2013 2306   HDL 32* 04/22/2013 2306   CHOLHDL 6.6 04/22/2013 2306   VLDL 40 04/22/2013 2306   LDLCALC 138* 04/22/2013 2306    Additional studies/ records that were reviewed today include:  Echocardiogram January 2014: Normal LV function with EF of 70%. Mild mitral regurgitation. Normal left atrial size.    ASSESSMENT:    1. PAF (paroxysmal atrial fibrillation)/atrial flutter   2. Hyperlipidemia   3. Mitral regurgitation   4. Chronic anticoagulation      PLAN:  In order of problems listed above:  1. I need to document the frequency of atrial fibrillation and whether rate control exists. She will need to wear a continuous monitor for 30 days to document whether the twice weekly  spells that she is having are truly atrial fibrillation. If she is having at this frequently with the disruption in her quality of life that she declares, we will convert to a rhythm control strategy. She may need to have an echocardiogram repeated depending upon our findings. 4.   Continue Apixiban. No bleeding complications. Plan to see the patient back in 6 months.    Medication Adjustments/Labs and Tests Ordered: Current medicines are reviewed at length with the patient today.  Concerns regarding medicines are outlined above.  Medication changes, Labs and Tests ordered today are listed in the Patient Instructions below. There are no Patient Instructions on  file for this visit.   Signed, Brittany NoeHenry W Smith III, MD  07/15/2015 12:35 PM    [facility] [address], [place], [place]  [address] Phone: [phone](336) [phone]; Fax: [phone]) [phone]

## 2015-07-15 ENCOUNTER — Ambulatory Visit (INDEPENDENT_AMBULATORY_CARE_PROVIDER_SITE_OTHER): Payer: Medicare Other | Admitting: Interventional Cardiology

## 2015-07-15 ENCOUNTER — Encounter: Payer: Self-pay | Admitting: Interventional Cardiology

## 2015-07-15 VITALS — BP 150/76 | HR 54 | Ht 71.0 in | Wt 220.0 lb

## 2015-07-15 DIAGNOSIS — E785 Hyperlipidemia, unspecified: Secondary | ICD-10-CM

## 2015-07-15 DIAGNOSIS — I480 Paroxysmal atrial fibrillation: Secondary | ICD-10-CM

## 2015-07-15 DIAGNOSIS — I340 Nonrheumatic mitral (valve) insufficiency: Secondary | ICD-10-CM | POA: Diagnosis not present

## 2015-07-15 DIAGNOSIS — Z7901 Long term (current) use of anticoagulants: Secondary | ICD-10-CM | POA: Diagnosis not present

## 2015-07-15 NOTE — Patient Instructions (Signed)
Medication Instructions:  Your physician recommends that you continue on your current medications as directed. Please refer to the Current Medication list given to you today.   Labwork: None orderd  Testing/Procedures: None ordered  Follow-Up: Your physician wants you to follow-up in: 6 months with Dr.Smith You will receive a reminder letter in the mail two months in advance. If you don't receive a letter, please call our office to schedule the follow-up appointment.   Any Other Special Instructions Will Be Listed Below (If Applicable). Dr.Smith will call you to discuss what can be done for Afib prevention. If you have not heard back in a week please call the office     If you need a refill on your cardiac medications before your next appointment, please call your pharmacy.

## 2015-07-20 ENCOUNTER — Telehealth: Payer: Self-pay

## 2015-07-20 DIAGNOSIS — I480 Paroxysmal atrial fibrillation: Secondary | ICD-10-CM

## 2015-07-20 NOTE — Telephone Encounter (Signed)
Called pt to give her Dr.Smith's recommendation. Per Dr.Smith pt needs to wear a 30 day event monitor to determine Afib burden. Pt is agreeable. Adv her that a scheduler from our office will call her to schedule. Pt voice appreciation for the call and verbalized understanding.

## 2015-07-28 ENCOUNTER — Telehealth: Payer: Self-pay | Admitting: Interventional Cardiology

## 2015-07-28 NOTE — Telephone Encounter (Signed)
New message       Request for surgical clearance:  What type of surgery is being performed? epideral steroid injection When is this surgery scheduled?  Pending clearance Are there any medications that need to be held prior to surgery and how long? Hold eliquis? If yes, for how long 1. Name of physician performing surgery? Dr Romero BellingWesley Ibazebo  2. What is your office phone and fax number? Fax [phone]

## 2015-07-29 NOTE — Telephone Encounter (Signed)
Hold eliquis x 48 hours then resume when safe per MD.

## 2015-07-30 ENCOUNTER — Telehealth: Payer: Self-pay | Admitting: Interventional Cardiology

## 2015-07-30 ENCOUNTER — Ambulatory Visit
Admission: RE | Admit: 2015-07-30 | Discharge: 2015-07-30 | Disposition: A | Discharge status: Home / Self Care | Payer: Medicare Other | Source: Ambulatory Visit

## 2015-07-30 DIAGNOSIS — Z1231 Encounter for screening mammogram for malignant neoplasm of breast: Secondary | ICD-10-CM

## 2015-07-30 NOTE — Telephone Encounter (Signed)
Fax placed in nurse fax box 

## 2015-07-30 NOTE — Telephone Encounter (Signed)
Patient is aware that clearance has been sent.

## 2015-07-30 NOTE — Telephone Encounter (Signed)
New message   Pt is calling for rn to call her about her surgical clearance  And what medication needs to be held

## 2015-08-05 ENCOUNTER — Telehealth: Payer: Self-pay | Admitting: Interventional Cardiology

## 2015-08-05 NOTE — Telephone Encounter (Signed)
Resent fax to number provided.

## 2015-08-05 NOTE — Telephone Encounter (Signed)
Follow up:  Would you please refax clearance from last week asap please. Please fax to:[phone] Att: Duwayne Heckanielle

## 2015-08-13 ENCOUNTER — Telehealth: Payer: Self-pay

## 2015-08-13 NOTE — Telephone Encounter (Signed)
Clearance form Brittany Ford for ok to HOLD Eliquis given to [facility]

## 2015-09-06 ENCOUNTER — Telehealth: Payer: Self-pay | Admitting: Interventional Cardiology

## 2015-09-06 NOTE — Telephone Encounter (Deleted)
Error

## 2015-09-08 NOTE — Telephone Encounter (Signed)
Follow up  Pt verbalized she's been feeling bad since Sat/Sun weekend and she's having some issues going on.  Pt verbalized stated she's not having SOB or Chest Pains.  Pt is aware of upcoming appt 8.7 @ 230 pm for event monitor.

## 2015-09-09 NOTE — Telephone Encounter (Signed)
I called pt . I asked how could I help her she states " you cant' help me today . I needed help yesterday and no one called me. I have some dizziness today. I don't need help today Thank you very much" " Tell Dr. Katrinka Blazing that I don't need any help" Then she hung up the phone. Antonieta Loveless to call pt, he aware that this message was sent to Misty Stanley, Dr. Michaelle Copas nurse yesterday on her day off.

## 2015-09-13 ENCOUNTER — Encounter (INDEPENDENT_AMBULATORY_CARE_PROVIDER_SITE_OTHER): Payer: Self-pay

## 2015-09-13 ENCOUNTER — Ambulatory Visit (INDEPENDENT_AMBULATORY_CARE_PROVIDER_SITE_OTHER): Payer: Medicare Other

## 2015-09-13 DIAGNOSIS — I480 Paroxysmal atrial fibrillation: Secondary | ICD-10-CM | POA: Diagnosis not present

## 2015-10-28 ENCOUNTER — Other Ambulatory Visit: Payer: Self-pay | Admitting: Interventional Cardiology

## 2015-10-28 DIAGNOSIS — I480 Paroxysmal atrial fibrillation: Secondary | ICD-10-CM

## 2015-12-02 ENCOUNTER — Telehealth: Payer: Self-pay | Admitting: Interventional Cardiology

## 2015-12-02 NOTE — Telephone Encounter (Signed)
Pt states that she went to pharmacy to pick up Eliquis and it was going to be $160 due to being in the donut hole.  Advised pt I would place samples at the front desk.  Pt verbalized understanding and was appreciative for assistance.

## 2015-12-02 NOTE — Telephone Encounter (Signed)
Pt is in the doughnut hole w/ her apixaban (ELIQUIS) 5 MG TABS tablet [[id number]][[id number]] she is unable to afford it, would like to know if she needs to go back on coumadin,  please give her a call @ [phone].

## 2016-01-17 ENCOUNTER — Telehealth: Payer: Self-pay | Admitting: Interventional Cardiology

## 2016-01-17 NOTE — Telephone Encounter (Signed)
New message  Regarding eliquis refill   Please call back

## 2016-01-17 NOTE — Telephone Encounter (Signed)
Called pt to inform her that we did not have any samples of Eliquis in at this time but she could call back on Thursday to see if we have gotten any samples in. I advised the pt that if she has any other problems, questions or concerns to call the office. Pt verbalized understanding.

## 2016-01-17 NOTE — Telephone Encounter (Signed)
Patient calling the office for samples of medication:   1.  What medication and dosage are you requesting samples for? Eliquis  2.  Are you currently out of this medication?  Pt said she was told to call back when she started on her last box,she takes 2 a day. Pt is in the in the donut hole

## 2016-01-21 ENCOUNTER — Telehealth: Payer: Self-pay | Admitting: Interventional Cardiology

## 2016-01-21 NOTE — Telephone Encounter (Signed)
samples placed at front desk

## 2016-01-21 NOTE — Telephone Encounter (Signed)
New message   Patient calling the office for samples of medication:   1.  What medication and dosage are you requesting samples for? Eliquis 5mg   2.  Are you currently out of this medication? 2-3 days left

## 2016-02-27 ENCOUNTER — Other Ambulatory Visit: Payer: Self-pay | Admitting: Interventional Cardiology

## 2016-03-06 ENCOUNTER — Telehealth: Payer: Self-pay | Admitting: Interventional Cardiology

## 2016-03-06 NOTE — Telephone Encounter (Signed)
New message    Pt verbalized that she is nolonger going to take her eliquis because she cant afford it, she wants rn to call her

## 2016-03-06 NOTE — Telephone Encounter (Signed)
Returned call to patient. She states she cannot afford eliqus 48m BID - it is costing her $300/month until her deductible is met - her drug deductible is $170. Patient has enough eliqus to last until Wednesday Jan 31  Offered samples to patient but she states she would like to go back on warfarin.   She states the only reason she wanted to be changed to a NOAC was so that she could eat greens.   She states Dr. BDrema Dallasmonitored her INR and dosed her coumadin when she was previouslt on this. Advised that she reach out to Dr. BDrema Dallasand I told patient I will contact her office as well and let Dr. STamala Julianknow what the patient's wishes are regarding her medication.

## 2016-03-07 NOTE — Telephone Encounter (Signed)
Spoke with pt to clarify she was able to get set up with Dr. Zachery DauerBarnes office for Coumadin checks.  Pt attempted to call yesterday and was unable to get in contact with them.  Pt is planning to call them now.  She does not wish to go through our office as our co pay is higher. Advised pt to call if any troubles. Pt appreciative for assistance.

## 2016-03-07 NOTE — Telephone Encounter (Signed)
Okay to resume warfarin

## 2016-03-10 ENCOUNTER — Telehealth: Payer: Self-pay | Admitting: Interventional Cardiology

## 2016-03-10 NOTE — Telephone Encounter (Signed)
New Message ° °Pt voiced for nurse to give her a call. ° °Please f/u °

## 2016-03-10 NOTE — Telephone Encounter (Addendum)
Spoke with pt and she states that she seen PCP yesterday and they mentioned she may be at higher risk for a clot during the switch to Warfarin .  Pt wishes to stay on Eliquis now.  She asked if we had samples to last her until the 14th when she can afford to get medication from drug store.  Will speak with refill team about samples and call pt back.   Samples were available.  Called pt and told her I would place samples at the front desk for her to pick up.  Pt appreciative for assistance.

## 2016-09-11 ENCOUNTER — Other Ambulatory Visit: Payer: Self-pay | Admitting: Family Medicine

## 2016-09-11 DIAGNOSIS — Z1231 Encounter for screening mammogram for malignant neoplasm of breast: Secondary | ICD-10-CM

## 2016-09-15 ENCOUNTER — Ambulatory Visit: Payer: Medicare Other

## 2016-09-21 NOTE — Progress Notes (Addendum)
Cardiology Office Note    Date:  09/22/2016   ID:  Brittany Ford, DOB [birth year], MRN [id number]  PCP:  Brittany Rainier, MD  Cardiologist: Brittany Noe, MD   Chief Complaint  Patient presents with  . Atrial Fibrillation  . Dizziness    History of Present Illness:  Brittany Ford is a 71 y.o. female with paroxysmal atrial fibrillation, and DM II.  Occasional episodes of lightheadedness/dizziness with sudden movements such as turning or going from sitting to standing. Tends to happen in the morning. Rarely occur at other times. Not associated with palpitations or racing heart.  Also has episodes up to several times a month of irregular heartbeat she can feel in the temporal region as well as irregularity in the chest. Episodes can be very brief lasting less than a minute or longer. A continuous monitor done in September 2017 demonstrated episodes of atrial fibrillation with the longest episode being greater than an hour and a half.  Past Medical History:  Diagnosis Date  . Atrial fibrillation (HCC)   . Atrial flutter (HCC)   . Chest pain    with typical and atypical qualities  . Chronic low back pain   . Diabetes mellitus without complication (HCC)    Diet Controlled  . Dysrhythmia    ATRIAL FIBRILATION  . GERD (gastroesophageal reflux disease)   . Obesity     Past Surgical History:  Procedure Laterality Date  . COLONOSCOPY  03/01/2012   Procedure: COLONOSCOPY;  Surgeon: Brittany Folk, MD;  Location: [facility] ENDOSCOPY;  Service: Endoscopy;  Laterality: N/A;  . ESOPHAGOGASTRODUODENOSCOPY  02/29/2012   Procedure: ESOPHAGOGASTRODUODENOSCOPY (EGD);  Surgeon: Brittany Folk, MD;  Location: [facility] ENDOSCOPY;  Service: Endoscopy;  Laterality: N/A;  . FOOT SURGERY     x2  . TONSILLECTOMY      Current Medications: Outpatient Medications Prior to Visit  Medication Sig Dispense Refill  . acetaminophen (TYLENOL) 325 MG tablet Take 650 mg by mouth every 6 (six) hours as  needed for pain.    Marland Kitchen apixaban (ELIQUIS) 5 MG TABS tablet Take 1 tablet (5 mg total) by mouth 2 (two) times daily. 60 tablet 8  . atorvastatin (LIPITOR) 10 MG tablet Take 1 tablet (10 mg total) by mouth daily. 90 tablet 3  . bacitracin ophthalmic ointment Place 1 application into both eyes at bedtime.  0  . furosemide (LASIX) 20 MG tablet Take 20 mg by mouth daily. Can take one (1) extra dose (20 mg total) by mouth as needed for swelling.  0  . metFORMIN (GLUCOPHAGE) 500 MG tablet Take 500 mg by mouth 2 (two) times daily.  2  . metoprolol (LOPRESSOR) 50 MG tablet TAKE 1 TABLET BY MOUTH TWICE A DAY, CAN TAKE AN EXTRA 25 MG (1/2 TABLET) AS NEEDED FOR AFIB EPISODES 225 tablet 1  . oxyCODONE-acetaminophen (PERCOCET/ROXICET) 5-325 MG tablet TAKE 1 TABLET BY MOUTH EVERY 4 TO 6 HOURS AS NEEDED FOR PAIN  0  . traMADol (ULTRAM) 50 MG tablet Take 50-100 mg by mouth every 6 (six) hours as needed for moderate pain.      No facility-administered medications prior to visit.      Allergies:   Bee venom   Social History   Social History  . Marital status: Widowed    Spouse name: N/A  . Number of children: 1  . Years of education: N/A   Social History Main Topics  . Smoking status: Former Smoker    Packs/day: 0.50  Years: 20.00    Types: Cigarettes    Quit date: 07/20/2005  . Smokeless tobacco: Never Used  . Alcohol use No  . Drug use: No  . Sexual activity: Not Asked   Other Topics Concern  . None   Social History Narrative  . None     Family History:  The patient's family history includes Breast cancer in her mother; COPD in her father; Heart attack in her brother and father; Heart disease in her sister; Lung cancer in her father.   ROS:   Please see the history of present illness.    Intermittent lower extremity swelling for which she uses furosemide when necessary. Palpitations, shortness of breath in recumbency on occasion. Hearing loss bilaterally. Shortness of breath with  activity, low back pain, muscle pain, headaches, difficulty with balance, leg swelling and joint swelling, snoring, and fatigue.  All other systems reviewed and are negative.   PHYSICAL EXAM:   VS:  BP 124/60   Pulse 77   Ht 5\' 10"  (1.778 m)   Wt 220 lb 9.6 oz (100.1 kg)   BMI 31.65 kg/m    GEN: Well nourished, well developed, in no acute distress  HEENT: normal  Neck: no JVD, carotid bruits, or masses Cardiac: RRR; no murmurs, rubs, or gallops,no edema  Respiratory:  clear to auscultation bilaterally, normal work of breathing GI: soft, nontender, nondistended, + BS MS: no deformity or atrophy  Skin: warm and dry, no rash Neuro:  Alert and Oriented x 3, Strength and sensation are intact Psych: euthymic mood, full affect  Wt Readings from Last 3 Encounters:  09/22/16 220 lb 9.6 oz (100.1 kg)  07/15/15 220 lb (99.8 kg)  10/13/14 223 lb 1.9 oz (101.2 kg)      Studies/Labs Reviewed:   EKG:  EKG  Atrial premature beats, otherwise sinus rhythm, left atrial abnormality, and poor R- wave progression. Compared to the prior tracings, PACs or new  Recent Labs: No results found for requested labs within last 8760 hours.   Lipid Panel    Component Value Date/Time   CHOL 210 (H) 04/22/2013 2306   TRIG 200 (H) 04/22/2013 2306   HDL 32 (L) 04/22/2013 2306   CHOLHDL 6.6 04/22/2013 2306   VLDL 40 04/22/2013 2306   LDLCALC 138 (H) 04/22/2013 2306    Additional studies/ records that were reviewed today include:  September 2017, 30 Day Monitor: Study Highlights     The basic underlying rhythm is normal sinus rhythm.  Episodes of atrial fibrillation do occur with longest AF episode lasting for 01:37:46. Rate control was noted during episodes of A. fib.  No excessive episodes of bradycardia.  No excessive increase in heart rate.   Paroxysmal atrial fibrillation with rate control is noted. No pauses or excessive bradycardia.      ASSESSMENT:    1. PAF (paroxysmal atrial  fibrillation)/atrial flutter   2. Dizziness   3. Chronic anticoagulation   4. Other hyperlipidemia   5. Peripheral vascular disease (HCC)   6. Non-rheumatic mitral regurgitation      PLAN:  In order of problems listed above:  1. Quality of life is adequate despite episodes of atrial fibrillation. We did discuss antiarrhythmic therapy and ablation. She is not willing to advance medical therapy currently. We will continue to follow and reserve additional therapy for clinical worsening. 2. Uncertain etiology. Could be related to rhythm but prior prolonged monitoring has not demonstrated excessively fast tachycardia or prolonged episodes of bradycardia or excessive causing.  An alternative possible explanation is vertigo. 3. No bleeding complications. 4. Management primary care. 5. Asymptomatic based upon today's conversation. 6. Not clinically significant.   Overall doing well. Denies angina. Having dizziness that we are simply going to follow at this time. Clinical follow-up in 6 months. Call earlier if symptoms worsen.    Medication Adjustments/Labs and Tests Ordered: Current medicines are reviewed at length with the patient today.  Concerns regarding medicines are outlined above.  Medication changes, Labs and Tests ordered today are listed in the Patient Instructions below. Patient Instructions  Medication Instructions:  None  Labwork: None  Testing/Procedures: None  Follow-Up: Your physician wants you to follow-up in: 6 months with Dr. Katrinka BlazingSmith.  You will receive a reminder letter in the mail two months in advance. If you don't receive a letter, please call our office to schedule the follow-up appointment.   Any Other Special Instructions Will Be Listed Below (If Applicable).     If you need a refill on your cardiac medications before your next appointment, please call your pharmacy.      Signed, Brittany NoeHenry W Smith III, MD  09/22/2016 8:46 AM    [facility]  [facility] [address], [place], [place]  [address] Phone: [phone]; Fax: [phone]

## 2016-09-22 ENCOUNTER — Encounter (INDEPENDENT_AMBULATORY_CARE_PROVIDER_SITE_OTHER): Payer: Self-pay

## 2016-09-22 ENCOUNTER — Encounter: Payer: Self-pay | Admitting: Interventional Cardiology

## 2016-09-22 ENCOUNTER — Ambulatory Visit (INDEPENDENT_AMBULATORY_CARE_PROVIDER_SITE_OTHER): Payer: Medicare Other | Admitting: Interventional Cardiology

## 2016-09-22 VITALS — BP 124/60 | HR 77 | Ht 70.0 in | Wt 220.6 lb

## 2016-09-22 DIAGNOSIS — Z7901 Long term (current) use of anticoagulants: Secondary | ICD-10-CM | POA: Diagnosis not present

## 2016-09-22 DIAGNOSIS — E7849 Other hyperlipidemia: Secondary | ICD-10-CM

## 2016-09-22 DIAGNOSIS — R42 Dizziness and giddiness: Secondary | ICD-10-CM

## 2016-09-22 DIAGNOSIS — I340 Nonrheumatic mitral (valve) insufficiency: Secondary | ICD-10-CM | POA: Diagnosis not present

## 2016-09-22 DIAGNOSIS — E784 Other hyperlipidemia: Secondary | ICD-10-CM | POA: Diagnosis not present

## 2016-09-22 DIAGNOSIS — I480 Paroxysmal atrial fibrillation: Secondary | ICD-10-CM | POA: Diagnosis not present

## 2016-09-22 DIAGNOSIS — I739 Peripheral vascular disease, unspecified: Secondary | ICD-10-CM

## 2016-09-22 NOTE — Patient Instructions (Signed)
Medication Instructions:  None  Labwork: None  Testing/Procedures: None  Follow-Up: Your physician wants you to follow-up in: 6 months with Dr. Smith.  You will receive a reminder letter in the mail two months in advance. If you don't receive a letter, please call our office to schedule the follow-up appointment.   Any Other Special Instructions Will Be Listed Below (If Applicable).     If you need a refill on your cardiac medications before your next appointment, please call your pharmacy.   

## 2016-09-25 NOTE — Addendum Note (Signed)
Addended by: Kerrie Buffalo on: 09/25/2016 09:14 AM   Modules accepted: Orders

## 2016-10-10 ENCOUNTER — Emergency Department (HOSPITAL_COMMUNITY): Payer: Medicare Other

## 2016-10-10 ENCOUNTER — Emergency Department (HOSPITAL_COMMUNITY)
Admission: EM | Admit: 2016-10-10 | Discharge: 2016-10-10 | Disposition: A | Discharge status: Home / Self Care | Payer: Medicare Other | Attending: Emergency Medicine | Admitting: Emergency Medicine

## 2016-10-10 ENCOUNTER — Encounter (HOSPITAL_COMMUNITY): Payer: Self-pay | Admitting: Emergency Medicine

## 2016-10-10 DIAGNOSIS — Z7901 Long term (current) use of anticoagulants: Secondary | ICD-10-CM | POA: Diagnosis not present

## 2016-10-10 DIAGNOSIS — I480 Paroxysmal atrial fibrillation: Secondary | ICD-10-CM

## 2016-10-10 DIAGNOSIS — M545 Low back pain, unspecified: Secondary | ICD-10-CM

## 2016-10-10 DIAGNOSIS — E119 Type 2 diabetes mellitus without complications: Secondary | ICD-10-CM | POA: Insufficient documentation

## 2016-10-10 DIAGNOSIS — Z79899 Other long term (current) drug therapy: Secondary | ICD-10-CM | POA: Insufficient documentation

## 2016-10-10 DIAGNOSIS — R1032 Left lower quadrant pain: Secondary | ICD-10-CM | POA: Diagnosis not present

## 2016-10-10 DIAGNOSIS — Z7984 Long term (current) use of oral hypoglycemic drugs: Secondary | ICD-10-CM | POA: Insufficient documentation

## 2016-10-10 DIAGNOSIS — Z87891 Personal history of nicotine dependence: Secondary | ICD-10-CM | POA: Diagnosis not present

## 2016-10-10 DIAGNOSIS — M549 Dorsalgia, unspecified: Secondary | ICD-10-CM | POA: Diagnosis present

## 2016-10-10 DIAGNOSIS — R109 Unspecified abdominal pain: Secondary | ICD-10-CM

## 2016-10-10 LAB — URINALYSIS, ROUTINE W REFLEX MICROSCOPIC
Bilirubin Urine: NEGATIVE
Glucose, UA: NEGATIVE mg/dL
Hgb urine dipstick: NEGATIVE
Ketones, ur: NEGATIVE mg/dL
Nitrite: NEGATIVE
Protein, ur: 30 mg/dL — AB
Specific Gravity, Urine: 1.025 (ref 1.005–1.030)
pH: 5 (ref 5.0–8.0)

## 2016-10-10 LAB — BASIC METABOLIC PANEL
Anion gap: 9 (ref 5–15)
BUN: 10 mg/dL (ref 6–20)
CO2: 21 mmol/L — ABNORMAL LOW (ref 22–32)
Calcium: 9.2 mg/dL (ref 8.9–10.3)
Chloride: 107 mmol/L (ref 101–111)
Creatinine, Ser: 0.95 mg/dL (ref 0.44–1.00)
GFR calc Af Amer: >60 mL/min (ref >60)
GFR calc non Af Amer: 59 mL/min — ABNORMAL LOW (ref >60)
Glucose, Bld: 186 mg/dL — ABNORMAL HIGH (ref 65–99)
Potassium: 4.2 mmol/L (ref 3.5–5.1)
Sodium: 137 mmol/L (ref 135–145)

## 2016-10-10 LAB — CBC
HCT: 38.4 % (ref 36.0–46.0)
Hemoglobin: 12.2 g/dL (ref 12.0–15.0)
MCH: 26.4 pg (ref 26.0–34.0)
MCHC: 31.8 g/dL (ref 30.0–36.0)
MCV: 83.1 fL (ref 78.0–100.0)
Platelets: 317 10*3/uL (ref 150–400)
RBC: 4.62 MIL/uL (ref 3.87–5.11)
RDW: 14.5 % (ref 11.5–15.5)
WBC: 9 10*3/uL (ref 4.0–10.5)

## 2016-10-10 MED ORDER — HYDROCODONE-ACETAMINOPHEN 5-325 MG PO TABS: 2.0000 | ORAL_TABLET | ORAL | 0 refills | Status: DC | PRN

## 2016-10-10 MED ORDER — FENTANYL CITRATE (PF) 100 MCG/2ML IJ SOLN
50.0000 ug | Freq: Once | INTRAMUSCULAR | Status: AC
  Administered 2016-10-10: 50 ug via INTRAVENOUS
  Filled 2016-10-10: qty 2

## 2016-10-10 MED ORDER — SODIUM CHLORIDE 0.9 % IV BOLUS (SEPSIS)
500.0000 mL | Freq: Once | INTRAVENOUS | Status: AC
  Administered 2016-10-10: 500 mL via INTRAVENOUS

## 2016-10-10 MED ORDER — ONDANSETRON 4 MG PO TBDP
4.0000 mg | ORAL_TABLET | Freq: Once | ORAL | Status: AC
  Administered 2016-10-10: 4 mg via ORAL
  Filled 2016-10-10: qty 1

## 2016-10-10 MED ORDER — MORPHINE SULFATE (PF) 4 MG/ML IV SOLN
4.0000 mg | Freq: Once | INTRAVENOUS | Status: AC
  Administered 2016-10-10: 4 mg via INTRAVENOUS
  Filled 2016-10-10: qty 1

## 2016-10-10 MED ORDER — METHOCARBAMOL 500 MG PO TABS: 500.0000 mg | ORAL_TABLET | Freq: Two times a day (BID) | ORAL | 0 refills | Status: DC

## 2016-10-10 NOTE — ED Notes (Signed)
Declined W/C at D/C and was escorted to lobby by RN. 

## 2016-10-10 NOTE — Discharge Instructions (Signed)
1. Robaxin & Norco as needed for pain management, you may also use tylenol, see your Orthopedist regarding back pain  2. Follow up: OB/GYN regarding endometrial thickening seen on CT scan, Dr. Zachery DauerBarnes as needed, Dr. Katrinka BlazingSmith regarding Afib and abdominal aortic aneurysm monitoring  3. Return to the ED if you develop numbness or weakness in legs or lose control of your bowels or bladder, or any new or concerning symptoms

## 2016-10-10 NOTE — ED Provider Notes (Signed)
[facility] DEPT Provider Note   CSN: [id number] Arrival date & time: 10/10/16  1026     History   Chief Complaint Chief Complaint  Patient presents with  . Atrial Fibrillation    HPI Brittany Ford is a 71 y.o. female.  Brittany Ford is a 71 y.o. Female with a history of paroxysmal A. Fib, H LD, diabetes, previous kidney stones, who presents from her PCPs office where she was found to be in A. fib with RVR. She was seeing her PCP today regarding with left-sided low back pain with associated nausea. On arrival to the emergency department patient appears to be back in sinus rhythm, she denies chest pain or palpitations. She reports some dyspnea on exertion but says that this is not a new problem and she is seen by Dr. Katrinka Blazing with cardiology regarding this and her A. Fib. Patient reports left-sided back pain has been going on for a few weeks but worsened this weekend. She feels that this pain is similar to previous kidney stones, she describes it as a sharp ache that is constant. Denies aggravating or alleviating factors, she has not tried any pain medication at home for this. She denies fever or chills, abdominal pain, dysuria, hematuria or frequency.      Past Medical History:  Diagnosis Date  . Atrial fibrillation (HCC)   . Atrial flutter (HCC)   . Chest pain    with typical and atypical qualities  . Chronic low back pain   . Diabetes mellitus without complication (HCC)    Diet Controlled  . Dysrhythmia    ATRIAL FIBRILATION  . GERD (gastroesophageal reflux disease)   . Obesity     Patient Active Problem List   Diagnosis Date Noted  . Bilateral lower extremity edema 10/13/2014  . Chronic anticoagulation 10/13/2014  . Preoperative cardiovascular examination 02/19/2014  . Mitral regurgitation 11/26/2012  . Peripheral vascular disease (HCC) 11/26/2012  . Cheerleading 02/28/2012  . Anemia associated with acute blood loss 02/28/2012  . PAF (paroxysmal atrial  fibrillation)/atrial flutter   . Hyperlipidemia 01/14/2012  . Diabetes mellitus (HCC) 01/14/2012    Past Surgical History:  Procedure Laterality Date  . COLONOSCOPY  03/01/2012   Procedure: COLONOSCOPY;  Surgeon: [name], MD;  Location: [facility] ENDOSCOPY;  Service: Endoscopy;  Laterality: N/A;  . ESOPHAGOGASTRODUODENOSCOPY  02/29/2012   Procedure: ESOPHAGOGASTRODUODENOSCOPY (EGD);  Surgeon: [name], MD;  Location: [facility] ENDOSCOPY;  Service: Endoscopy;  Laterality: N/A;  . FOOT SURGERY     x2  . TONSILLECTOMY      OB History    No data available       Home Medications    Prior to Admission medications   Medication Sig Start Date End Date Taking? Authorizing Provider  acetaminophen (TYLENOL) 325 MG tablet Take 650 mg by mouth every 6 (six) hours as needed for pain.    [provider]  apixaban (ELIQUIS) 5 MG TABS tablet Take 1 tablet (5 mg total) by mouth 2 (two) times daily. 10/28/15   Lyn Records, MD  atorvastatin (LIPITOR) 10 MG tablet Take 1 tablet (10 mg total) by mouth daily. 11/21/13   Corky Crafts, MD  bacitracin ophthalmic ointment Place 1 application into both eyes at bedtime. 10/05/14   [provider]  furosemide (LASIX) 20 MG tablet Take 20 mg by mouth daily. Can take one (1) extra dose (20 mg total) by mouth as needed for swelling. 09/14/14   [provider]  metFORMIN (GLUCOPHAGE) 500  MG tablet Take 500 mg by mouth 2 (two) times daily. 10/03/14   [provider]  metoprolol (LOPRESSOR) 50 MG tablet TAKE 1 TABLET BY MOUTH TWICE A DAY, CAN TAKE AN EXTRA 25 MG (1/2 TABLET) AS NEEDED FOR AFIB EPISODES 02/29/16   Lyn Records, MD  oxyCODONE-acetaminophen (PERCOCET/ROXICET) 5-325 MG tablet TAKE 1 TABLET BY MOUTH EVERY 4 TO 6 HOURS AS NEEDED FOR PAIN 06/20/15   [provider]  traMADol (ULTRAM) 50 MG tablet Take 50-100 mg by mouth every 6 (six) hours as needed for moderate pain.     [provider]    Family  History Family History  Problem Relation Age of Onset  . COPD Father   . Heart attack Father   . Lung cancer Father   . Breast cancer Mother   . Heart attack Brother   . Heart disease Sister     Social History Social History  Substance Use Topics  . Smoking status: Former Smoker    Packs/day: 0.50    Years: 20.00    Types: Cigarettes    Quit date: 07/20/2005  . Smokeless tobacco: Never Used  . Alcohol use No     Allergies   Bee venom   Review of Systems Review of Systems  Constitutional: Negative for chills and fever.  HENT: Negative for congestion, ear pain, rhinorrhea and sore throat.   Respiratory: Negative for cough, chest tightness and shortness of breath.   Cardiovascular: Negative for chest pain, palpitations and leg swelling.  Gastrointestinal: Positive for nausea. Negative for abdominal pain, diarrhea and vomiting.  Genitourinary: Positive for flank pain. Negative for dysuria and frequency.  Musculoskeletal: Positive for back pain.  Skin: Negative for pallor and rash.  Neurological: Negative for dizziness, weakness, light-headedness, numbness and headaches.     Physical Exam Updated Vital Signs BP 111/72   Pulse (!) 50   Temp 98 F (36.7 C) (Oral)   Resp 16   SpO2 100%   Physical Exam  Constitutional: She appears well-developed and well-nourished. No distress.  HENT:  Head: Normocephalic and atraumatic.  Eyes: Pupils are equal, round, and reactive to light. EOM are normal. Right eye exhibits no discharge. Left eye exhibits no discharge.  Cardiovascular: Normal rate, regular rhythm, normal heart sounds and intact distal pulses.   Regular rate and rhythm, does not appear to be in A. fib  Pulmonary/Chest: Effort normal and breath sounds normal. No respiratory distress.  Abdominal: Soft. Bowel sounds are normal. There is no tenderness. There is no guarding.  No CVA tenderness  Musculoskeletal: She exhibits no edema or deformity.  Tender to palpation  on left lower back and left flank, nontender at midline of the lumbar spine. Able to move all extremities  Neurological: She is alert. Coordination normal.  Speech is clear, able to follow commands CN III-XII intact Normal strength in upper and lower extremities bilaterally including dorsiflexion and plantar flexion, strong and equal grip strength Sensation normal to light and sharp touch Moves extremities without ataxia, coordination intact    Skin: Skin is warm and dry. Capillary refill takes less than 2 seconds. She is not diaphoretic.  Psychiatric: She has a normal mood and affect. Her behavior is normal.  Nursing note and vitals reviewed.    ED Treatments / Results  Labs (all labs ordered are listed, but only abnormal results are displayed) Labs Reviewed  BASIC METABOLIC PANEL - Abnormal; Notable for the following:       Result Value  CO2 21 (*)    Glucose, Bld 186 (*)    GFR calc non Af Amer 59 (*)    All other components within normal limits  URINALYSIS, ROUTINE W REFLEX MICROSCOPIC - Abnormal; Notable for the following:    APPearance HAZY (*)    Protein, ur 30 (*)    Leukocytes, UA TRACE (*)    Bacteria, UA RARE (*)    Squamous Epithelial / LPF 0-5 (*)    All other components within normal limits  CBC    EKG  EKG Interpretation  Date/Time:  Tuesday October 10 2016 10:30:03 EDT Ventricular Rate:  65 PR Interval:  184 QRS Duration: 74 QT Interval:  412 QTC Calculation: 428 R Axis:   60 Text Interpretation:  Sinus rhythm with marked sinus arrhythmia Right atrial enlargement Low voltage QRS Borderline ECG No significant change was found Confirmed by Azalia Bilis ([id number]) on 10/10/2016 12:20:39 PM       Radiology Dg Chest 2 View  Result Date: 10/10/2016 CLINICAL DATA:  Left flank pain EXAM: CHEST  2 VIEW COMPARISON:  04/22/2013 FINDINGS: Mild interstitial prominence in the lungs. Heart is normal size. No confluent opacities or effusions. No acute bony  abnormality. IMPRESSION: Mild interstitial prominence in the lungs. This could reflect early mild chronic interstitial lung disease. No confluent opacities. Electronically Signed   By: Charlett Nose M.D.   On: 10/10/2016 11:51   Ct Renal Stone Study  Result Date: 10/10/2016 CLINICAL DATA:  Flank pain EXAM: CT ABDOMEN AND PELVIS WITHOUT CONTRAST TECHNIQUE: Multidetector CT imaging of the abdomen and pelvis was performed following the standard protocol without oral or intravenous contrast material administration. COMPARISON:  None. FINDINGS: Lower chest: There is mild lower lobe bronchiectatic change bilaterally. There is mild scarring in the right middle lobe medially. There is no lung base edema or consolidation. There is a small hiatal hernia. Hepatobiliary: No focal liver lesions are appreciable on this noncontrast enhanced study. Gallbladder wall is not appreciably thickened. There is no biliary duct dilatation. Pancreas: There is no pancreatic mass or inflammatory focus. Spleen: No splenic lesions are evident. Adrenals/Urinary Tract: Adrenals bilaterally appear normal. There is a 2.5 x 2.4 cm cyst in the posterior mid right kidney. There is no appreciable hydronephrosis on either side. There is no renal or ureteral calculus on either side. Urinary bladder is midline with wall thickness within normal limits. There is mild fat within the urinary bladder wall. Stomach/Bowel: There are multiple sigmoid diverticula without diverticulitis. There are also multiple diverticula throughout the descending colon without diverticulitis. There is no appreciable bowel wall or mesenteric thickening. There is no bowel obstruction. No free air or portal venous air. Vascular/Lymphatic: There is atherosclerotic calcification in the aorta and iliac arteries. There is dilatation of the infrarenal abdominal aorta with a maximum measured transverse diameter of 3.4 x 3.3 cm. There is no perianeurysmal fluid. Major mesenteric vessels  appear patent on this noncontrast enhanced study, although there is moderate calcification in the celiac and superior mesenteric arteries. There is no appreciable adenopathy in the abdomen or pelvis. Reproductive: The uterus is lobulated in contour consistent with leiomyomatous change. There are multiple calcifications in a leiomyoma in the posterior uterus measuring 3.5 x 3.1 cm. There is fluid in the endometrium with a measured thickness of the endometrium of 3.0 cm, abnormal. No extrauterine pelvic masses appreciable. Other: Appendix appears normal. There is no abscess or ascites in the abdomen or pelvis. There is a small ventral hernia containing only fat.  Musculoskeletal: There is degenerative change throughout the lumbar spine. There is a degree of spinal stenosis at L4-5 due to disc protrusion and bony hypertrophy. There are no blastic or lytic bone lesions. There is no intramuscular or abdominal wall lesion. IMPRESSION: 1. There is thickening and fluid in the endometrium. This appearance raises concern for potential underlying endometrial neoplasm. Endometrial sampling may well be advised in this regard. 2.  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 3.  Multiple left-sided colonic diverticula without diverticulitis. 4. 3.4 x 3.3 cm infrarenal abdominal aortic aneurysm. Multiple foci of aortoiliac atherosclerosis noted. Recommend followup by ultrasound in 3 years. This recommendation follows ACR consensus guidelines: White Paper of the ACR Incidental Findings Committee II on Vascular Findings. J Am Coll Radiol 2013; 10:789-794. 5.  No renal or ureteral calculus.  No hydronephrosis. 6.  No bowel obstruction.  No abscess.  Appendix appears normal. 7.  Moderate spinal stenosis at L4-5, multifactorial. 8.  Small hiatal hernia.  Small ventral hernia containing only fat. 9.  Mild bilateral lower lobe bronchiectatic change. Aortic aneurysm NOS (ICD10-I71.9). Aortic Atherosclerosis (ICD10-I70.0). Electronically Signed    By: Bretta BangWilliam  Woodruff III M.D.   On: 10/10/2016 13:29    Procedures Procedures (including critical care time)  Medications Ordered in ED Medications  ondansetron (ZOFRAN-ODT) disintegrating tablet 4 mg (4 mg Oral Given 10/10/16 1259)  fentaNYL (SUBLIMAZE) injection 50 mcg (50 mcg Intravenous Given 10/10/16 1300)  morphine 4 MG/ML injection 4 mg (4 mg Intravenous Given 10/10/16 1440)  sodium chloride 0.9 % bolus 500 mL (0 mLs Intravenous Stopped 10/10/16 1601)     Initial Impression / Assessment and Plan / ED Course  I have reviewed the triage vital signs and the nursing notes.  Pertinent labs & imaging results that were available during my care of the patient were reviewed by me and considered in my medical decision making (see chart for details).  Patient sent from PCP due to A. fib with RVR, patient complaining of left flank and lower back pain. On initial eval vitals are stable patient appears to be in sinus rhythm with heart rate in the 60s, currently denies chest pain. CXR unremarkable. Patient is currently anticoagulated with Eliquis and rate controlled with metoprolol, she is followed by Dr. Katrinka BlazingSmith with cardiology no further intervention regarding A. fib necessary at this time.  Will provide Zofran and fentanyl for pain management. BMP shows good kidney function, labs otherwise unremarkable. CT stone study shows no evidence of kidney stone or hydronephrosis, it does show some thickening of the endometrium, as well as mild spinal stenosis at L4-L5. Infrarenal AAA noted to be 3.4 x 3.3 cm on CT, follow-up with 3 year ultrasound recommended.  On re-eval patient still complaining of left-sided back pain, back pain likely the result of spinal stenosis at the L4-L5 level, will provide morphine and 500 mL fluid bolus. Discussed imaging results with patient and patient's sister who both express understanding. Instructed patient to seek follow-up with OB/GYN regarding endometrial thickening and the need  for possible biopsy. Patient plans to see orthopedic doctor regarding back pain, Robaxin and Norco provided for pain relief, instructed patient that she may also use Tylenol as needed. Patient to follow up with PCP and cardiologist as well. Return precautions provided, patient expresses understanding and is in agreement with plan for discharge home.  Patient discussed with Dr. Patria Maneampos, who saw patient as well and agrees with plan.   Final Clinical Impressions(s) / ED Diagnoses   Final diagnoses:  Left flank  pain  Left-sided low back pain without sciatica, unspecified chronicity  Paroxysmal atrial fibrillation [facility])    New Prescriptions Discharge Medication List as of 10/10/2016  3:52 PM    START taking these medications   Details  HYDROcodone-acetaminophen (NORCO) 5-325 MG tablet Take 2 tablets by mouth every 4 (four) hours as needed., Starting Tue 10/10/2016, Print    methocarbamol (ROBAXIN) 500 MG tablet Take 1 tablet (500 mg total) by mouth 2 (two) times daily., Starting Tue 10/10/2016, Print         Dartha Lodge, PA-C 10/10/16 1716    Azalia Bilis, MD 10/13/16 [phone]

## 2016-10-10 NOTE — ED Triage Notes (Signed)
Pt states she went to her MD today for side and back pain. Her MD obtained an EKG and noticed Afib HR 130's. Told pt to come to ED. PT denies feeling lightheaded or dizzy.

## 2016-10-11 ENCOUNTER — Telehealth (HOSPITAL_COMMUNITY): Payer: Self-pay | Admitting: *Deleted

## 2016-10-11 NOTE — Telephone Encounter (Signed)
Pt in ED with afib, instructed to follow up with Dr.Henry Katrinka BlazingSmith.  Phone rang with no answer, no mach.  Will try again later

## 2016-10-20 ENCOUNTER — Ambulatory Visit
Admission: RE | Admit: 2016-10-20 | Discharge: 2016-10-20 | Disposition: A | Discharge status: Home / Self Care | Payer: Medicare Other | Source: Ambulatory Visit | Attending: Family Medicine | Admitting: Family Medicine

## 2016-10-20 DIAGNOSIS — Z1231 Encounter for screening mammogram for malignant neoplasm of breast: Secondary | ICD-10-CM

## 2016-10-28 ENCOUNTER — Other Ambulatory Visit: Payer: Self-pay | Admitting: Interventional Cardiology

## 2016-10-28 DIAGNOSIS — I480 Paroxysmal atrial fibrillation: Secondary | ICD-10-CM

## 2016-10-30 NOTE — Telephone Encounter (Signed)
Eliquis  received, pt is 70yrs old, Wt-100.1kg, Crea-0.95 on 10/10/16, last seen by Dr. Katrinka Blazing on 09/22/16. Will send in refill request to requested pharmacy.

## 2016-12-26 ENCOUNTER — Telehealth: Payer: Self-pay

## 2016-12-26 NOTE — Telephone Encounter (Signed)
   [facility] Pre-operative Risk Assessment    Request for surgical clearance:  1. What type of surgery is being performed? Hysteroscopy/D&C/polyectomy  2. When is this surgery scheduled? TBD   3. Are there any medications that need to be held prior to surgery and how long?we are requesting recommendations for stopping eliquis in relation to surgery date   4. Practice name and name of physician performing surgery?  1. [facility]   5. What is your office phone and fax number?  1. Phone [phone] 2. Fax [phone] Attn: Myrene   6. Anesthesia type (None, local, MAC, general) ? None specified    Brittany Ford 12/26/2016, 12:08 PM  _________________________________________________________________   (provider comments below)

## 2016-12-26 NOTE — Telephone Encounter (Signed)
Pt takes Eliquis for afib with CHADS2VASc score of 4 (sex, age, PAD, DM). Renal function is normal. Ok to hold Eliquis 2 days prior to procedure.

## 2016-12-26 NOTE — Telephone Encounter (Signed)
    Chart reviewed as part of pre-operative protocol coverage. Patient was contacted 12/26/2016 in reference to pre-operative risk assessment for pending surgery as outlined below.  Brittany Ford was last seen on 09/22/16 by Dr. Katrinka BlazingSmith. I attempted to contact pt by phone to assess for any cardiac symptoms to determine if she can be cleared or if she will need an office visit. Only 1 contact number listed for pt. No answer. Mailbox full. Will need f/u call. Pt is on Eliquis for PAF. Will route to pharmacy pool for a/c recommendations.    Robbie LisBrittainy Simmons, PA-C 12/26/2016, 2:15 PM

## 2016-12-26 NOTE — Telephone Encounter (Signed)
Note       [facility] Pre-operative Risk Assessment    Request for surgical clearance:  1. What type of surgery is being performed? Hysteroscopy/D&C/polyectomy  2. When is this surgery scheduled? TBD   3. Are there any medications that need to be held prior to surgery and how long?we are requesting recommendations for stopping eliquis in relation to surgery date   4. Practice name and name of physician performing surgery?       1. [facility]   5. What is your office phone and fax number?  1. Phone [phone] 2. Fax [phone] Attn: Myrene   6. Anesthesia type (None, local, MAC, general) ? None specified    Brittany Ford 12/26/2016, 12:08 PM  _________________________________________________________________   (provider comments below)

## 2016-12-27 ENCOUNTER — Telehealth: Payer: Self-pay | Admitting: Interventional Cardiology

## 2016-12-27 NOTE — Telephone Encounter (Signed)
[facility] to make [facility] aware we are waiting to hear back from Dr. Katrinka BlazingSmith before clearing her for her procedure.

## 2016-12-27 NOTE — Telephone Encounter (Signed)
New Message  Pt call requesting to speak with RN about the colonoscopy procedure. She states she did not have the procedure. And would like to know if she would need to get a new clearance for the procedure. Please call back to discuss

## 2016-12-27 NOTE — Telephone Encounter (Signed)
Spoke with pt and she wanted to make sure that the office knew that she did not have her colonoscopy.  Her GYN is wanting to do a procedure and she felt it was more important and didn't want it to be delayed d/t having to hold Eliquis for Colonoscopy.  Clearance has been sent to pre op pool for GYN procedure.  While speaking with pt she mentioned that she is still having the "black out" spells that she was mentioned when she last seen Dr. Katrinka BlazingSmith.  Pt states they are unchanged but they are still occurring.  Family is concerned about this and feel she requires someone to stay with her.  She states the housing authority where she lives will require a letter and some forms filled out in order to have someone stay with her.  She wanted to know if Dr. Katrinka BlazingSmith would sign these forms and write the letter and wanted to know if he felt like she needed to be seen again?  Advised I would send message for him to review.

## 2016-12-27 NOTE — Telephone Encounter (Signed)
    Chart reviewed as part of pre-operative protocol coverage. Patient was last seen 09/2016 by Dr. Katrinka BlazingSmith. Attempt was made to contact the patient to discuss symptoms without success. However, nurse reached the patient today by phone today with the following phone note:  "Spoke with pt and she wanted to make sure that the office knew that she did not have her colonoscopy.  Her GYN is wanting to do a procedure and she felt it was more important and didn't want it to be delayed d/t having to hold Eliquis for Colonoscopy.  Clearance has been sent to pre op pool for GYN procedure.  While speaking with pt she mentioned that she is still having the "black out" spells that she was mentioned when she last seen Dr. Katrinka BlazingSmith.  Pt states they are unchanged but they are still occurring.  Family is concerned about this and feel she requires someone to stay with her.  She states the housing authority where she lives will require a letter and some forms filled out in order to have someone stay with her.  She wanted to know if Dr. Katrinka BlazingSmith would sign these forms and write the letter and wanted to know if he felt like she needed to be seen again?  Advised I would send message for him to review. "  Julio SicksBowers, Jennifer L, LPN  16/11/9609/21/18 [id number]   Will need to table decision for clearance until further input from Dr. Katrinka BlazingSmith on other note regarding the above. Will CC Dr. Michaelle CopasSmith's nurse to make her aware. Victorino DikeJennifer, can you let the surgeon's office know that Dr. Katrinka BlazingSmith will need to review cardiac symptoms for further review before providing clearance? Thanks.  Laurann Montanaayna N Dunn, PA-C 12/27/2016, 4:38 PM

## 2016-12-29 NOTE — Telephone Encounter (Signed)
I can't write letter unless I have a diagnosis. Needs to wer a 30 day monitor and then possible have a Loop if no answer found.

## 2017-01-01 NOTE — Telephone Encounter (Signed)
Faxed to [facility]

## 2017-01-01 NOTE — Telephone Encounter (Signed)
Error

## 2017-01-01 NOTE — Telephone Encounter (Signed)
Spoke with pt and went over recommendations per Dr. Katrinka BlazingSmith.  Pt does not want to proceed with monitor at this time.  States she has too much going on right now with GYN procedure and needing to reschedule colonoscopy.  Pt states she will contact me if/when she changes her mind about wearing the monitor.  Pt appreciative for call. Will route to Dr. Katrinka BlazingSmith to make him aware.

## 2017-01-01 NOTE — Telephone Encounter (Signed)
[facility]  This is a low risk procedure that she should be able to tolerate without significant trouble.  For history of recurrent near syncope episodes, will replace a 30-day monitor.  I do not believe that she has to wait for completion of the study to proceed with the GYN evaluation/procedure.  Eliquis needs to be held for at least 48 hours prior to the procedure and resumed when the gynecologist feels it would be safe to restart.

## 2017-02-07 NOTE — Progress Notes (Signed)
Called patient to schedule PAT appointment.  PAT appointments were offered on Wednesday afternoon and Thursday morning at 1015 or 1115 am and Thursday afternoon appointment.  Patient not able to come for PAT appt. prior to surgery on Friday, 02/10/16.  Spoke with Dr. Anitra LauthWarren Miller regarding patient's history of A-fib, A-flutter, Diabetes and Genella RifeGerd. Patient is 72 yr old.  Per Dr. Hyacinth MeekerMiller, [facility].  Patient has stopped eliquis per her cardiologist instructions.  Patient informed ok to take metoprolol on DOS with sip of water.  Patient also informed that if something is abnormal on DOS, her surgery could be cancelled.  Called MD's office and spoke with Myrene to let her know of the above information.  Patient verbalized understanding.

## 2017-02-08 ENCOUNTER — Other Ambulatory Visit: Payer: Self-pay | Admitting: Obstetrics and Gynecology

## 2017-02-09 ENCOUNTER — Ambulatory Visit (HOSPITAL_COMMUNITY): Payer: Medicare Other | Admitting: Anesthesiology

## 2017-02-09 ENCOUNTER — Ambulatory Visit (HOSPITAL_COMMUNITY)
Admission: RE | Admit: 2017-02-09 | Discharge: 2017-02-09 | Disposition: A | Discharge status: Home / Self Care | Payer: Medicare Other | Source: Ambulatory Visit | Attending: Obstetrics and Gynecology | Admitting: Obstetrics and Gynecology

## 2017-02-09 ENCOUNTER — Other Ambulatory Visit: Payer: Self-pay | Admitting: Obstetrics and Gynecology

## 2017-02-09 ENCOUNTER — Encounter (HOSPITAL_COMMUNITY)
Admission: RE | Disposition: A | Discharge status: Home / Self Care | Payer: Self-pay | Source: Ambulatory Visit | Attending: Obstetrics and Gynecology

## 2017-02-09 ENCOUNTER — Other Ambulatory Visit: Payer: Self-pay

## 2017-02-09 ENCOUNTER — Encounter (HOSPITAL_COMMUNITY): Payer: Self-pay

## 2017-02-09 DIAGNOSIS — Z885 Allergy status to narcotic agent status: Secondary | ICD-10-CM | POA: Insufficient documentation

## 2017-02-09 DIAGNOSIS — Z7984 Long term (current) use of oral hypoglycemic drugs: Secondary | ICD-10-CM | POA: Diagnosis not present

## 2017-02-09 DIAGNOSIS — N882 Stricture and stenosis of cervix uteri: Secondary | ICD-10-CM | POA: Diagnosis not present

## 2017-02-09 DIAGNOSIS — Z888 Allergy status to other drugs, medicaments and biological substances status: Secondary | ICD-10-CM | POA: Diagnosis not present

## 2017-02-09 DIAGNOSIS — K219 Gastro-esophageal reflux disease without esophagitis: Secondary | ICD-10-CM | POA: Insufficient documentation

## 2017-02-09 DIAGNOSIS — N840 Polyp of corpus uteri: Secondary | ICD-10-CM | POA: Insufficient documentation

## 2017-02-09 DIAGNOSIS — Z91030 Bee allergy status: Secondary | ICD-10-CM | POA: Diagnosis not present

## 2017-02-09 DIAGNOSIS — E119 Type 2 diabetes mellitus without complications: Secondary | ICD-10-CM | POA: Diagnosis not present

## 2017-02-09 DIAGNOSIS — Z79899 Other long term (current) drug therapy: Secondary | ICD-10-CM | POA: Insufficient documentation

## 2017-02-09 DIAGNOSIS — I4891 Unspecified atrial fibrillation: Secondary | ICD-10-CM | POA: Diagnosis not present

## 2017-02-09 DIAGNOSIS — R9389 Abnormal findings on diagnostic imaging of other specified body structures: Secondary | ICD-10-CM | POA: Diagnosis present

## 2017-02-09 DIAGNOSIS — Z87891 Personal history of nicotine dependence: Secondary | ICD-10-CM | POA: Insufficient documentation

## 2017-02-09 DIAGNOSIS — I10 Essential (primary) hypertension: Secondary | ICD-10-CM | POA: Insufficient documentation

## 2017-02-09 DIAGNOSIS — I480 Paroxysmal atrial fibrillation: Secondary | ICD-10-CM

## 2017-02-09 HISTORY — PX: HYSTEROSCOPY WITH D & C: SHX1775

## 2017-02-09 LAB — BASIC METABOLIC PANEL
Anion gap: 11 (ref 5–15)
BUN: 15 mg/dL (ref 6–20)
CO2: 22 mmol/L (ref 22–32)
Calcium: 8.8 mg/dL — ABNORMAL LOW (ref 8.9–10.3)
Chloride: 104 mmol/L (ref 101–111)
Creatinine, Ser: 0.85 mg/dL (ref 0.44–1.00)
GFR calc Af Amer: >60 mL/min (ref >60)
GFR calc non Af Amer: >60 mL/min (ref >60)
Glucose, Bld: 158 mg/dL — ABNORMAL HIGH (ref 65–99)
Potassium: 3.9 mmol/L (ref 3.5–5.1)
Sodium: 137 mmol/L (ref 135–145)

## 2017-02-09 LAB — CBC
HCT: 37.8 % (ref 36.0–46.0)
Hemoglobin: 11.9 g/dL — ABNORMAL LOW (ref 12.0–15.0)
MCH: 27.2 pg (ref 26.0–34.0)
MCHC: 31.5 g/dL (ref 30.0–36.0)
MCV: 86.3 fL (ref 78.0–100.0)
Platelets: 290 10*3/uL (ref 150–400)
RBC: 4.38 MIL/uL (ref 3.87–5.11)
RDW: 14.8 % (ref 11.5–15.5)
WBC: 7.4 10*3/uL (ref 4.0–10.5)

## 2017-02-09 SURGERY — DILATATION AND CURETTAGE /HYSTEROSCOPY
Anesthesia: General

## 2017-02-09 MED ORDER — LIDOCAINE HCL (CARDIAC) 20 MG/ML IV SOLN
INTRAVENOUS | Status: DC | PRN
  Administered 2017-02-09: 60 mg via INTRAVENOUS

## 2017-02-09 MED ORDER — ONDANSETRON HCL 4 MG/2ML IJ SOLN
INTRAMUSCULAR | Status: DC | PRN
  Administered 2017-02-09: 4 mg via INTRAVENOUS

## 2017-02-09 MED ORDER — LIDOCAINE HCL (CARDIAC) 20 MG/ML IV SOLN
INTRAVENOUS | Status: AC
  Filled 2017-02-09: qty 5

## 2017-02-09 MED ORDER — FENTANYL CITRATE (PF) 100 MCG/2ML IJ SOLN
INTRAMUSCULAR | Status: AC
  Filled 2017-02-09: qty 2

## 2017-02-09 MED ORDER — DEXAMETHASONE SODIUM PHOSPHATE 10 MG/ML IJ SOLN
INTRAMUSCULAR | Status: DC | PRN
  Administered 2017-02-09: 4 mg via INTRAVENOUS

## 2017-02-09 MED ORDER — EPHEDRINE SULFATE 50 MG/ML IJ SOLN
INTRAMUSCULAR | Status: DC | PRN
  Administered 2017-02-09: 5 mg via INTRAVENOUS
  Administered 2017-02-09: 10 mg via INTRAVENOUS

## 2017-02-09 MED ORDER — MEPERIDINE HCL 25 MG/ML IJ SOLN: 6.2500 mg | INTRAMUSCULAR | Status: DC | PRN

## 2017-02-09 MED ORDER — ONDANSETRON HCL 4 MG/2ML IJ SOLN
INTRAMUSCULAR | Status: AC
  Filled 2017-02-09: qty 2

## 2017-02-09 MED ORDER — FENTANYL CITRATE (PF) 100 MCG/2ML IJ SOLN
INTRAMUSCULAR | Status: DC | PRN
  Administered 2017-02-09: 25 ug via INTRAVENOUS

## 2017-02-09 MED ORDER — PROPOFOL 10 MG/ML IV BOLUS
INTRAVENOUS | Status: DC | PRN
  Administered 2017-02-09: 100 mg via INTRAVENOUS

## 2017-02-09 MED ORDER — DEXAMETHASONE SODIUM PHOSPHATE 4 MG/ML IJ SOLN
INTRAMUSCULAR | Status: AC
  Filled 2017-02-09: qty 1

## 2017-02-09 MED ORDER — EPHEDRINE 5 MG/ML INJ
INTRAVENOUS | Status: AC
  Filled 2017-02-09: qty 10

## 2017-02-09 MED ORDER — FENTANYL CITRATE (PF) 100 MCG/2ML IJ SOLN: 25.0000 ug | INTRAMUSCULAR | Status: DC | PRN

## 2017-02-09 MED ORDER — PROPOFOL 10 MG/ML IV BOLUS
INTRAVENOUS | Status: AC
  Filled 2017-02-09: qty 20

## 2017-02-09 MED ORDER — IBUPROFEN 600 MG PO TABS: ORAL_TABLET | ORAL | 0 refills | Status: DC

## 2017-02-09 MED ORDER — BUPIVACAINE HCL (PF) 0.25 % IJ SOLN
INTRAMUSCULAR | Status: DC | PRN
  Administered 2017-02-09: 20 mL

## 2017-02-09 MED ORDER — APIXABAN 5 MG PO TABS: 5.0000 mg | ORAL_TABLET | Freq: Two times a day (BID) | ORAL | 11 refills | Status: DC

## 2017-02-09 MED ORDER — BUPIVACAINE HCL (PF) 0.25 % IJ SOLN
INTRAMUSCULAR | Status: AC
  Filled 2017-02-09: qty 30

## 2017-02-09 MED ORDER — LACTATED RINGERS IV SOLN
INTRAVENOUS | Status: DC
  Administered 2017-02-09: 125 mL/h via INTRAVENOUS

## 2017-02-09 SURGICAL SUPPLY — 20 items
BIPOLAR CUTTING LOOP 21FR (ELECTRODE)
CANISTER SUCT 3000ML PPV (MISCELLANEOUS) ×2 IMPLANT
CATH ROBINSON RED A/P 16FR (CATHETERS) ×2 IMPLANT
CONTAINER PREFILL 10% NBF 60ML (FORM) ×4 IMPLANT
DEVICE MYOSURE LITE (MISCELLANEOUS) ×1 IMPLANT
DILATOR CANAL MILEX (MISCELLANEOUS) ×1 IMPLANT
ELECT REM PT RETURN 9FT ADLT (ELECTROSURGICAL) ×2
ELECTRODE REM PT RTRN 9FT ADLT (ELECTROSURGICAL) ×1 IMPLANT
GLOVE BIOGEL M 6.5 STRL (GLOVE) ×4 IMPLANT
GLOVE BIOGEL PI IND STRL 6.5 (GLOVE) ×1 IMPLANT
GLOVE BIOGEL PI IND STRL 7.0 (GLOVE) ×1 IMPLANT
GLOVE BIOGEL PI INDICATOR 6.5 (GLOVE) ×1
GLOVE BIOGEL PI INDICATOR 7.0 (GLOVE) ×1
GOWN STRL REUS W/TWL LRG LVL3 (GOWN DISPOSABLE) ×4 IMPLANT
LOOP CUTTING BIPOLAR 21FR (ELECTRODE) IMPLANT
PACK VAGINAL MINOR WOMEN LF (CUSTOM PROCEDURE TRAY) ×2 IMPLANT
PAD OB MATERNITY 4.3X12.25 (PERSONAL CARE ITEMS) ×2 IMPLANT
TOWEL OR 17X24 6PK STRL BLUE (TOWEL DISPOSABLE) ×4 IMPLANT
TUBING AQUILEX INFLOW (TUBING) ×2 IMPLANT
TUBING AQUILEX OUTFLOW (TUBING) ×2 IMPLANT

## 2017-02-09 NOTE — H&P (Signed)
Date of Initial H&P:02/09/2017  History reviewed, patient examined, no change in status, stable for surgery.

## 2017-02-09 NOTE — Anesthesia Postprocedure Evaluation (Signed)
Anesthesia Post Note  Patient: Brittany Ford  Procedure(s) Performed: DILATATION AND CURETTAGE /HYSTEROSCOPY (N/A )     Anesthesia Post Evaluation  Last Vitals:  Vitals:   02/09/17 1020  BP: (!) 130/96  Pulse: 83  Resp: 16  Temp: (!) 36.4 C  SpO2: 99%    Last Pain:  Vitals:   02/09/17 1020  TempSrc: Oral  PainSc: 6    Pain Goal: Patients Stated Pain Goal: 6 (02/09/17 1020)               Malinova,Nataliya Hristova

## 2017-02-09 NOTE — Anesthesia Procedure Notes (Signed)
Procedure Name: LMA Insertion Date/Time: 02/09/2017 12:17 PM Performed by: Elgie CongoMalinova, Nataliya H, CRNA Pre-anesthesia Checklist: Patient identified, Emergency Drugs available, Suction available and Patient being monitored Patient Re-evaluated:Patient Re-evaluated prior to induction Oxygen Delivery Method: Circle system utilized Preoxygenation: Pre-oxygenation with 100% oxygen Induction Type: IV induction LMA: LMA inserted LMA Size: 3.0 Number of attempts: 1 Placement Confirmation: positive ETCO2 and breath sounds checked- equal and bilateral Tube secured with: Tape Dental Injury: Teeth and Oropharynx as per pre-operative assessment

## 2017-02-09 NOTE — Op Note (Signed)
02/09/2017  12:55 PM  PATIENT:  Brittany Ford  72 y.o. female  PRE-OPERATIVE DIAGNOSIS:  R93.89 Thickened Endometrium  POST-OPERATIVE DIAGNOSIS:  Thickened Endometrium  PROCEDURE:  Procedure(s) with comments: DILATATION AND CURETTAGE /HYSTEROSCOPY (N/A) - w/Myosure for Polyp  SURGEON:  Surgeon(s) and Role:    Gerald Leitz* Dakarai Mcglocklin, MD - Primary  PHYSICIAN ASSISTANT:   ASSISTANTS: none   ANESTHESIA:   general  EBL:  5 mL   BLOOD ADMINISTERED:none  DRAINS: none   LOCAL MEDICATIONS USED:  MARCAINE     SPECIMEN:  Source of Specimen:  endometrial currettings   DISPOSITION OF SPECIMEN:  PATHOLOGY  COUNTS:  YES  TOURNIQUET:  * No tourniquets in log *  DICTATION: .Dragon Dictation  PLAN OF CARE: Discharge to home after PACU  PATIENT DISPOSITION:  PACU - hemodynamically stable.   Delay start of Pharmacological VTE agent (>24hrs) due to surgical blood loss or risk of bleeding: not applicable.   Findings: cervical stenosis/ clear endometrial fluid/ endometrial synechiae/ atrophic appearing endometrium.   Procedure: Patient was taken to the operating room where she was placed under general anesthesia. She was placed in the dorsal lithotomy position. Time out was performed.  She was prepped and draped in the usual sterile fashion. A speculum was placed into the vaginal vault. The anterior lip of the cervix was grasped with a single-tooth tenaculum. Quarter percent Marcaine was injected at the 4 and 8:00 positions of the cervix. The cervix was then sounded to 8 cm. The cervix was dilated to approximately 8 mm. Diagnostic hysteroscope was inserted. The findings noted above. The hysteroscope was removed. Sharp curet was introduced and endometrial corrected curettings were obtained.  The lite blade was inserted into the myosure hysteroscope and the uterine synechiae was excised. There was no evidence of evidence of uterine perforation  . Hysteroscope was then removed. The single-tooth  tenaculum was removed from the anterior lip of the cervix. Excellent hemostasis was noted. The speculum was removed from the patient's vagina. She was awakened from anesthesia taken to the recovery room  awake and in stable condition. Sponge lap and needle counts were correct x2.

## 2017-02-09 NOTE — Transfer of Care (Signed)
Immediate Anesthesia Transfer of Care Note  Patient: Brittany Ford  Procedure(s) Performed: DILATATION AND CURETTAGE /HYSTEROSCOPY (N/A )  Patient Location: PACU  Anesthesia Type:General  Level of Consciousness: awake  Airway & Oxygen Therapy: Patient Spontanous Breathing and Patient connected to nasal cannula oxygen  Post-op Assessment: Report given to RN, Post -op Vital signs reviewed and stable and Patient moving all extremities  Post vital signs: Reviewed and stable  Last Vitals:  Vitals:   02/09/17 1020  BP: (!) 130/96  Pulse: 83  Resp: 16  Temp: (!) 36.4 C  SpO2: 99%    Last Pain:  Vitals:   02/09/17 1020  TempSrc: Oral  PainSc: 6       Patients Stated Pain Goal: 6 (02/09/17 1020)  Complications: No apparent anesthesia complications
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
## 2017-02-09 NOTE — Discharge Instructions (Signed)

## 2017-02-09 NOTE — Anesthesia Preprocedure Evaluation (Addendum)
Anesthesia Evaluation  Patient identified by MRN, date of birth, ID band Patient awake    Reviewed: Allergy & Precautions, NPO status , Patient's Chart, lab work & pertinent test results  Airway Mallampati: II  TM Distance: >3 FB Neck ROM: Full    Dental no notable dental hx.    Pulmonary neg pulmonary ROS, former smoker,    Pulmonary exam normal breath sounds clear to auscultation       Cardiovascular hypertension, Pt. on medications and Pt. on home beta blockers negative cardio ROS Normal cardiovascular exam+ dysrhythmias Atrial Fibrillation  Rhythm:Regular Rate:Normal     Neuro/Psych negative neurological ROS  negative psych ROS   GI/Hepatic negative GI ROS, Neg liver ROS, GERD  ,  Endo/Other  negative endocrine ROSdiabetes, Oral Hypoglycemic Agents  Renal/GU negative Renal ROS  negative genitourinary   Musculoskeletal negative musculoskeletal ROS (+)   Abdominal   Peds negative pediatric ROS (+)  Hematology negative hematology ROS (+) anemia ,   Anesthesia Other Findings   Reproductive/Obstetrics negative OB ROS                           Anesthesia Physical Anesthesia Plan  ASA: III  Anesthesia Plan: General   Post-op Pain Management:    Induction: Intravenous  PONV Risk Score and Plan: 3 and Ondansetron, Dexamethasone and Treatment may vary due to age or medical condition  Airway Management Planned: LMA and Oral ETT  Additional Equipment:   Intra-op Plan:   Post-operative Plan:   Informed Consent: I have reviewed the patients History and Physical, chart, labs and discussed the procedure including the risks, benefits and alternatives for the proposed anesthesia with the patient or authorized representative who has indicated his/her understanding and acceptance.     Plan Discussed with: CRNA, Surgeon and Anesthesiologist  Anesthesia Plan Comments: ( )        Anesthesia Quick Evaluation

## 2017-02-10 ENCOUNTER — Encounter (HOSPITAL_COMMUNITY): Payer: Self-pay | Admitting: Obstetrics and Gynecology

## 2017-03-29 NOTE — Progress Notes (Deleted)
Cardiology Office Note    Date:  03/29/2017   ID:  Brittany Ford, DOB 06/20/1945, MRN [id number]  PCP:  Juluis RainierBarnes, Elizabeth, MD  Cardiologist: Lesleigh NoeHenry W Smith III, MD   No chief complaint on file.   History of Present Illness:  Brittany Ford is a 72 y.o. female with paroxysmal atrial fibrillation, and DM II.      Past Medical History:  Diagnosis Date  . Atrial fibrillation (HCC)   . Atrial flutter (HCC)   . Chest pain    with typical and atypical qualities  . Chronic low back pain   . Diabetes mellitus without complication (HCC)    Diet Controlled  . Dysrhythmia    ATRIAL FIBRILATION  . GERD (gastroesophageal reflux disease)   . Obesity     Past Surgical History:  Procedure Laterality Date  . BREAST EXCISIONAL BIOPSY Right 1990s   benign  . BREAST EXCISIONAL BIOPSY Left 1990s   benign  . COLONOSCOPY  03/01/2012   Procedure: COLONOSCOPY;  Surgeon: Barrie FolkJohn C Hayes, MD;  [facility];  Service: Endoscopy;  Laterality: N/A;  . ESOPHAGOGASTRODUODENOSCOPY  02/29/2012   Procedure: ESOPHAGOGASTRODUODENOSCOPY (EGD);  Surgeon: Barrie FolkJohn C Hayes, MD;  [facility];  Service: Endoscopy;  Laterality: N/A;  . FOOT SURGERY     x2  . HYSTEROSCOPY W/D&C N/A 02/09/2017   Procedure: DILATATION AND CURETTAGE /HYSTEROSCOPY;  Surgeon: Gerald Leitzole, Tara, MD;  Location: [facility] ORS;  Service: Gynecology;  Laterality: N/A;  w/Myosure for Polyp  . TONSILLECTOMY      Current Medications: Outpatient Medications Prior to Visit  Medication Sig Dispense Refill  . acetaminophen (TYLENOL) 325 MG tablet Take 650 mg by mouth at bedtime as needed for moderate pain.     Marland Kitchen. apixaban (ELIQUIS) 5 MG TABS tablet Take 1 tablet (5 mg total) by mouth 2 (two) times daily. Starting 02/10/2017 60 tablet 11  . Cholecalciferol (VITAMIN D) 2000 units CAPS Take 4,000 Units by mouth daily.    . diclofenac (FLECTOR) 1.3 % PTCH Place 1 patch onto the skin daily as needed (pain).    . furosemide (LASIX) 20 MG  tablet Take 20-40 mg by mouth daily as needed for fluid or edema.   0  . ibuprofen (ADVIL,MOTRIN) 600 MG tablet 1 by mouth every 6 hours as needed 20 tablet 0  . metFORMIN (GLUCOPHAGE) 500 MG tablet Take 500 mg by mouth 2 (two) times daily.  2  . metoprolol (LOPRESSOR) 50 MG tablet TAKE 1 TABLET BY MOUTH TWICE A DAY, CAN TAKE AN EXTRA 25 MG (1/2 TABLET) AS NEEDED FOR AFIB EPISODES 225 tablet 1  . Multiple Vitamin (MULTIVITAMIN WITH MINERALS) TABS tablet Take 1 tablet by mouth daily.    . traMADol (ULTRAM) 50 MG tablet Take 50 mg by mouth at bedtime as needed for moderate pain.      No facility-administered medications prior to visit.      Allergies:   Bee venom; Hydrocodone-homatropine; Lipitor [atorvastatin]; Proair hfa [albuterol]; and Xarelto [rivaroxaban]   Social History   Socioeconomic History  . Marital status: Widowed    Spouse name: Not on file  . Number of children: 1  . Years of education: Not on file  . Highest education level: Not on file  Social Needs  . Financial resource strain: Not on file  . Food insecurity - worry: Not on file  . Food insecurity - inability: Not on file  . Transportation needs - medical: Not on file  . Transportation needs -  non-medical: Not on file  Occupational History  . Not on file  Tobacco Use  . Smoking status: Former Smoker    Packs/day: 0.50    Years: 20.00    Pack years: 10.00    Types: Cigarettes    Last attempt to quit: 07/20/2005    Years since quitting: 11.6  . Smokeless tobacco: Never Used  Substance and Sexual Activity  . Alcohol use: No  . Drug use: No  . Sexual activity: Not on file  Other Topics Concern  . Not on file  Social History Narrative  . Not on file     Family History:  The patient's ***family history includes Breast cancer in her mother; COPD in her father; Heart attack in her brother and father; Heart disease in her sister; Lung cancer in her father.   ROS:   Please see the history of present illness.      ***  All other systems reviewed and are negative.   PHYSICAL EXAM:   VS:  There were no vitals taken for this visit.   GEN: Well nourished, well developed, in no acute distress  HEENT: normal  Neck: no JVD, carotid bruits, or masses Cardiac: ***RRR; no murmurs, rubs, or gallops,no edema  Respiratory:  clear to auscultation bilaterally, normal work of breathing GI: soft, nontender, nondistended, + BS MS: no deformity or atrophy  Skin: warm and dry, no rash Neuro:  Alert and Oriented x 3, Strength and sensation are intact Psych: euthymic mood, full affect  Wt Readings from Last 3 Encounters:  02/09/17 220 lb (99.8 kg)  09/22/16 220 lb 9.6 oz (100.1 kg)  07/15/15 220 lb (99.8 kg)      Studies/Labs Reviewed:   EKG:  EKG  ***  Recent Labs: 02/09/2017: BUN 15; Creatinine, Ser 0.85; Hemoglobin 11.9; Platelets 290; Potassium 3.9; Sodium 137   Lipid Panel    Component Value Date/Time   CHOL 210 (H) 04/22/2013 2306   TRIG 200 (H) 04/22/2013 2306   HDL 32 (L) 04/22/2013 2306   CHOLHDL 6.6 04/22/2013 2306   VLDL 40 04/22/2013 2306   LDLCALC 138 (H) 04/22/2013 2306    Additional studies/ records that were reviewed today include:  ***    ASSESSMENT:    1. PAF (paroxysmal atrial fibrillation)/atrial flutter   2. Peripheral vascular disease (HCC)   3. Type 2 diabetes mellitus without complication, without long-term current use of insulin (HCC)   4. Chronic anticoagulation      PLAN:  In order of problems listed above:  1. ***    Medication Adjustments/Labs and Tests Ordered: Current medicines are reviewed at length with the patient today.  Concerns regarding medicines are outlined above.  Medication changes, Labs and Tests ordered today are listed in the Patient Instructions below. There are no Patient Instructions on file for this visit.   Signed, [name], MD  03/29/2017 1:01 PM    [facility] [address] [place], [place], [place]   [address] Phone: [phone]; Fax: [phone]

## 2017-03-30 ENCOUNTER — Ambulatory Visit: Payer: Medicare Other | Admitting: Interventional Cardiology

## 2017-04-05 ENCOUNTER — Encounter (HOSPITAL_COMMUNITY): Payer: Self-pay

## 2017-04-05 ENCOUNTER — Emergency Department (HOSPITAL_COMMUNITY): Payer: Medicare Other

## 2017-04-05 ENCOUNTER — Emergency Department (HOSPITAL_COMMUNITY)
Admission: EM | Admit: 2017-04-05 | Discharge: 2017-04-05 | Disposition: A | Discharge status: Home / Self Care | Payer: Medicare Other | Attending: Emergency Medicine | Admitting: Emergency Medicine

## 2017-04-05 DIAGNOSIS — Z7984 Long term (current) use of oral hypoglycemic drugs: Secondary | ICD-10-CM | POA: Insufficient documentation

## 2017-04-05 DIAGNOSIS — Z87891 Personal history of nicotine dependence: Secondary | ICD-10-CM | POA: Insufficient documentation

## 2017-04-05 DIAGNOSIS — Z7901 Long term (current) use of anticoagulants: Secondary | ICD-10-CM | POA: Diagnosis not present

## 2017-04-05 DIAGNOSIS — R002 Palpitations: Secondary | ICD-10-CM | POA: Diagnosis present

## 2017-04-05 DIAGNOSIS — E119 Type 2 diabetes mellitus without complications: Secondary | ICD-10-CM | POA: Insufficient documentation

## 2017-04-05 DIAGNOSIS — I482 Chronic atrial fibrillation, unspecified: Secondary | ICD-10-CM

## 2017-04-05 LAB — CBC
HCT: 37.6 % (ref 36.0–46.0)
Hemoglobin: 12.1 g/dL (ref 12.0–15.0)
MCH: 27.6 pg (ref 26.0–34.0)
MCHC: 32.2 g/dL (ref 30.0–36.0)
MCV: 85.6 fL (ref 78.0–100.0)
Platelets: 290 10*3/uL (ref 150–400)
RBC: 4.39 MIL/uL (ref 3.87–5.11)
RDW: 14.8 % (ref 11.5–15.5)
WBC: 8 10*3/uL (ref 4.0–10.5)

## 2017-04-05 LAB — I-STAT TROPONIN, ED: Troponin i, poc: 0 ng/mL (ref 0.00–0.08)

## 2017-04-05 LAB — BASIC METABOLIC PANEL
Anion gap: 9 (ref 5–15)
BUN: 10 mg/dL (ref 6–20)
CO2: 23 mmol/L (ref 22–32)
Calcium: 9.2 mg/dL (ref 8.9–10.3)
Chloride: 105 mmol/L (ref 101–111)
Creatinine, Ser: 0.89 mg/dL (ref 0.44–1.00)
GFR calc Af Amer: >60 mL/min (ref >60)
GFR calc non Af Amer: >60 mL/min (ref >60)
Glucose, Bld: 208 mg/dL — ABNORMAL HIGH (ref 65–99)
Potassium: 4.3 mmol/L (ref 3.5–5.1)
Sodium: 137 mmol/L (ref 135–145)

## 2017-04-05 NOTE — Discharge Instructions (Signed)
Return here as needed.  Follow-up with your cardiologist as soon as possible.  °

## 2017-04-05 NOTE — ED Provider Notes (Signed)
[facility] EMERGENCY DEPARTMENT Provider Note   CSN: [id number] Arrival date & time: 04/05/17  1141     History   Chief Complaint No chief complaint on file.   HPI Brittany Ford is a 72 y.o. female.  HPI Patient presents to the emergency department with pulsations that occurred last night and into this morning.  The patient states they have stopped since she has been here.  She feels no symptoms at this time.  The patient states that her arm pain she feels like was coming from her cervical radiculopathy.  Patient states that nothing seemed to make the condition better or worse.  She thinks she drank some coffee with caffeine that usually triggers her palpitations.  The patient denies chest pain, shortness of breath, headache,blurred vision, neck pain, fever, cough, weakness, numbness, dizziness, anorexia, edema, abdominal pain, nausea, vomiting, diarrhea, rash, back pain, dysuria, hematemesis, bloody stool, near syncope, or syncope. Past Medical History:  Diagnosis Date  . Atrial fibrillation (HCC)   . Atrial flutter (HCC)   . Chest pain    with typical and atypical qualities  . Chronic low back pain   . Diabetes mellitus without complication (HCC)    Diet Controlled  . Dysrhythmia    ATRIAL FIBRILATION  . GERD (gastroesophageal reflux disease)   . Obesity     Patient Active Problem List   Diagnosis Date Noted  . Thickened endometrium 02/09/2017  . Bilateral lower extremity edema 10/13/2014  . Chronic anticoagulation 10/13/2014  . Preoperative cardiovascular examination 02/19/2014  . Mitral regurgitation 11/26/2012  . Peripheral vascular disease (HCC) 11/26/2012  . Cheerleading 02/28/2012  . Anemia associated with acute blood loss 02/28/2012  . PAF (paroxysmal atrial fibrillation)/atrial flutter   . Hyperlipidemia 01/14/2012  . Diabetes mellitus (HCC) 01/14/2012    Past Surgical History:  Procedure Laterality Date  . BREAST EXCISIONAL  BIOPSY Right 1990s   benign  . BREAST EXCISIONAL BIOPSY Left 1990s   benign  . COLONOSCOPY  03/01/2012   Procedure: COLONOSCOPY;  Surgeon: [name], MD;  Location[facility] END[facility]COPY;  Service: Endoscopy;  Laterality: N/A;  . ESOPHAGOGASTRODUODENOSCOPY  02/29/2012   Procedure: ESOPHAGOGASTRODUODENOSCOPY (EGD);  Surgeon: [name], MD;  Location: [facility] ENDOSCOPY;  Service: Endoscopy;  Laterality: N/A;  . FOOT SURGERY     x2  . HYSTEROSCOPY W/D&C N/A 02/09/2017   Procedure: DILATATION AND CURETTAGE /HYSTEROSCOPY;  Surgeon: Gerald Leitz, MD;  Location: [facility] ORS;  Service: Gynecology;  Laterality: N/A;  w/Myosure for Polyp  . TONSILLECTOMY      OB History    No data available       Home Medications    Prior to Admission medications   Medication Sig Start Date End Date Taking? Authorizing Provider  acetaminophen (TYLENOL) 325 MG tablet Take 650 mg by mouth at bedtime as needed for moderate pain.     [provider]  apixaban (ELIQUIS) 5 MG TABS tablet Take 1 tablet (5 mg total) by mouth 2 (two) times daily. Starting 02/10/2017 02/09/17   Gerald Leitz, MD  Cholecalciferol (VITAMIN D) 2000 units CAPS Take 4,000 Units by mouth daily.    [provider]  diclofenac (FLECTOR) 1.3 % PTCH Place 1 patch onto the skin daily as needed (pain).    [provider]  furosemide (LASIX) 20 MG tablet Take 20-40 mg by mouth daily as needed for fluid or edema.  09/14/14   [provider]  ibuprofen (ADVIL,MOTRIN) 600 MG tablet 1 by mouth every  6 hours as needed 02/09/17   Gerald Leitzole, Tara, MD  metFORMIN (GLUCOPHAGE) 500 MG tablet Take 500 mg by mouth 2 (two) times daily. 10/03/14   [provider]  metoprolol (LOPRESSOR) 50 MG tablet TAKE 1 TABLET BY MOUTH TWICE A DAY, CAN TAKE AN EXTRA 25 MG (1/2 TABLET) AS NEEDED FOR AFIB EPISODES 02/29/16   Lyn RecordsSmith, Henry W, MD  Multiple Vitamin (MULTIVITAMIN WITH MINERALS) TABS tablet Take 1 tablet by mouth daily.    [provider]  traMADol  (ULTRAM) 50 MG tablet Take 50 mg by mouth at bedtime as needed for moderate pain.     [provider]    Family History Family History  Problem Relation Age of Onset  . COPD Father   . Heart attack Father   . Lung cancer Father   . Breast cancer Mother   . Heart attack Brother   . Heart disease Sister     Social History Social History   Tobacco Use  . Smoking status: Former Smoker    Packs/day: 0.50    Years: 20.00    Pack years: 10.00    Types: Cigarettes    Last attempt to quit: 07/20/2005    Years since quitting: 11.7  . Smokeless tobacco: Never Used  Substance Use Topics  . Alcohol use: No  . Drug use: No     Allergies   Bee venom; Hydrocodone-homatropine; Lipitor [atorvastatin]; Proair hfa [albuterol]; and Xarelto [rivaroxaban]   Review of Systems Review of Systems All other systems negative except as documented in the HPI. All pertinent positives and negatives as reviewed in the HPI.  Physical Exam Updated Vital Signs BP 114/71 (BP Location: Left Arm)   Pulse (!) 47   Temp 97.8 F (36.6 C) (Oral)   Resp 18   Ht 5\' 11"  (1.803 m)   Wt 99.8 kg (220 lb)   SpO2 98%   BMI 30.68 kg/m   Physical Exam  Constitutional: She is oriented to person, place, and time. She appears well-developed and well-nourished. No distress.  HENT:  Head: Normocephalic and atraumatic.  Mouth/Throat: Oropharynx is clear and moist.  Eyes: Pupils are equal, round, and reactive to light.  Neck: Normal range of motion. Neck supple.  Cardiovascular: Normal rate, regular rhythm and normal heart sounds. Exam reveals no gallop and no friction rub.  No murmur heard. Pulmonary/Chest: Effort normal and breath sounds normal. No respiratory distress. She has no wheezes.  Abdominal: Soft. Bowel sounds are normal. She exhibits no distension. There is no tenderness.  Neurological: She is alert and oriented to person, place, and time. She exhibits normal muscle tone. Coordination normal.   Skin: Skin is warm and dry. Capillary refill takes less than 2 seconds. No rash noted. No erythema.  Psychiatric: She has a normal mood and affect. Her behavior is normal.  Nursing note and vitals reviewed.    ED Treatments / Results  Labs (all labs ordered are listed, but only abnormal results are displayed) Labs Reviewed  BASIC METABOLIC PANEL - Abnormal; Notable for the following components:      Result Value   Glucose, Bld 208 (*)    All other components within normal limits  CBC  I-STAT TROPONIN, ED    EKG  EKG Interpretation None       Radiology Dg Chest 2 View  Result Date: 04/05/2017 CLINICAL DATA:  Chest pain, short of breath, headache EXAM: CHEST  2 VIEW COMPARISON:  10/10/2016 FINDINGS: Normal mediastinum and cardiac silhouette. Normal  pulmonary vasculature. No evidence of effusion, infiltrate, or pneumothorax. No acute bony abnormality. IMPRESSION: No acute cardiopulmonary process. Electronically Signed   By: Genevive Bi M.D.   On: 04/05/2017 12:27    Procedures Procedures (including critical care time)  Medications Ordered in ED Medications - No data to display   Initial Impression / Assessment and Plan / ED Course  I have reviewed the triage vital signs and the nursing notes.  Pertinent labs & imaging results that were available during my care of the patient were reviewed by me and considered in my medical decision making (see chart for details).     Patient states she would like to be discharged home she does not want to stay for any further workup.  The patient had 2 sets of negative troponins and the rest of her laboratory testing was normal.  Her vital signs remained stable.  Patient is advised to follow-up with her cardiologist told return here as needed. Final Clinical Impressions(s) / ED Diagnoses   Final diagnoses:  None    ED Discharge Orders    None       Charlestine Night, PA-C 04/07/17 0039    Tilden Fossa,  MD 04/08/17 [phone]

## 2017-04-05 NOTE — ED Notes (Signed)
Pt refusing to change into a gown, stating that she is upset that she had to wait four hours in the waiting room and isn't going to be here long so she doesn't need to get in a gown.

## 2017-04-05 NOTE — ED Notes (Signed)
Pt is not wanting to be hooked to any monitors and is asking to leave. She stated she feels she drank caffeine and made her heart rate to elevate. She stated she wants to just go to her cardiologist. She feels she is fine and wants to be discharged. MD made aware.

## 2017-04-05 NOTE — ED Triage Notes (Signed)
Patient reports that she awoke this am with headache and left arm tingling. States that she went on to work and developed palpitations/CP. Appears slightly anxious and tearful on arrival. Alert and oriented, NAD

## 2017-06-05 ENCOUNTER — Telehealth: Payer: Self-pay | Admitting: Interventional Cardiology

## 2017-06-05 NOTE — Telephone Encounter (Signed)
New Message     [facility] Pre-operative Risk Assessment    Request for surgical clearance:  1. What type of surgery is being performed? Epidural injection in surgical spine  2. When is this surgery scheduled? tbd  3. What type of clearance is required (medical clearance vs. Pharmacy clearance to hold med vs. Both)? pharmacy  4. Are there any medications that need to be held prior to surgery and how long? Eliquis   5. Practice name and name of physician performing surgery? [facility]/ Dr. Ron Agee  6. What is your office phone number [phone]   7.   What is your office fax number [phone]  8.   Anesthesia type (None, local, MAC, general) ? none   Brittany Ford 06/05/2017, 4:01 PM  _________________________________________________________________   (provider comments below)

## 2017-06-07 NOTE — Telephone Encounter (Signed)
   Primary Cardiologist: No primary care provider on file.  Chart reviewed as part of pre-operative protocol coverage. Given past medical history and time since last visit, based on ACC/AHA guidelines, Brittany Ford would be at acceptable risk for the planned procedure without further cardiovascular testing.   The patient's chart has been reviewed by our pharmacist and per office protocol, the patient can hold Eliquis for 3 days prior to procedure.  Our office staff will get in contact with the patient to notify them of this.  Given the procedure is not yet scheduled, the performing office will need to get in touch with the patient to provide them with a procedure date so she can hold her Eliquis appropriately.  Resumption of Eliquis should be undertaken as soon as safely possible determined by the treating physician.  I will route this recommendation to the requesting party via Epic fax function and remove from pre-op pool.  Please call with questions.  Eula Listen, PA-C 06/07/2017, 3:13 PM

## 2017-06-07 NOTE — Telephone Encounter (Signed)
Patient with diagnosis of Afib on Eliquis for anticoagulation.    Procedure: ESI Date of procedure: TBD  CHADS2-VASc score of  4 (CHF, HTN, AGE, DM2, stroke/tia x 2, CAD/PVD, AGE, female)  CrCl 85ml/min  Per office protocol, patient can hold Eliquis for 3 days prior to procedure.

## 2017-06-15 NOTE — Telephone Encounter (Signed)
   Primary Cardiologist:Henry Malissa Hippo III, MD  Chart reviewed as part of pre-operative protocol coverage. Pre-op clearance already addressed by colleagues in earlier phone notes and faxed to the requesting office. To summarize recommendations:  - Per Eula Listen PA-C on 06/07/17, he states: "Given past medical history and time since last visit, based on ACC/AHA guidelines, Brittany Ford would be at acceptable risk for the planned procedure without further cardiovascular testing. "  Per Pharmacist, "Per office protocol, patient can hold Eliquis for 3 days prior to procedure."  Will route this bundled recommendation to requesting provider via Epic fax function once again, and remove from APP pool. Please call with questions.  Laurann Montana, PA-C 06/15/2017, 1:14 PM

## 2017-06-15 NOTE — Telephone Encounter (Signed)
Follow Up:   Duwayne Heck calling to find out the status of pt's clearance. Please fax asap to [phone] [id number]

## 2017-07-05 NOTE — Progress Notes (Signed)
Cardiology Office Note    Date:  07/06/2017   ID:  Brittany Ford, DOB [birth year], MRN [id number]  PCP:  Brittany Rainier, MD  Cardiologist: [name], MD   Chief Complaint  Patient presents with  . Atrial Fibrillation    History of Present Illness:  Brittany Ford is a 72 y.o. female  with paroxysmal atrial fibrillation, and DM II.  Several episodes of atrial fibrillation in the interim since our last visit.  2 emergency room visits.  Atrial fibrillation causes her to have palpitations and feel weak.  She feels great today.   Past Medical History:  Diagnosis Date  . Atrial fibrillation (HCC)   . Atrial flutter (HCC)   . Chest pain    with typical and atypical qualities  . Chronic low back pain   . Diabetes mellitus without complication (HCC)    Diet Controlled  . Dysrhythmia    ATRIAL FIBRILATION  . GERD (gastroesophageal reflux disease)   . Obesity     Past Surgical History:  Procedure Laterality Date  . BREAST EXCISIONAL BIOPSY Right 1990s   benign  . BREAST EXCISIONAL BIOPSY Left 1990s   benign  . COLONOSCOPY  03/01/2012   Procedure: COLONOSCOPY;  Surgeon: [name], MD;  Location: [facility] ENDOSCOPY;  Service: Endoscopy;  Laterality: N/A;  . ESOPHAGOGASTRODUODENOSCOPY  02/29/2012   Procedure: ESOPHAGOGASTRODUODENOSCOPY (EGD);  Surgeon: [name], MD;  Location: [facility] ENDOSCOPY;  Service: Endoscopy;  Laterality: N/A;  . FOOT SURGERY     x2  . HYSTEROSCOPY W/D&C N/A 02/09/2017   Procedure: DILATATION AND CURETTAGE /HYSTEROSCOPY;  Surgeon: Gerald Leitz, MD;  Location: [facility] ORS;  Service: Gynecology;  Laterality: N/A;  w/Myosure for Polyp  . TONSILLECTOMY      Current Medications: Outpatient Medications Prior to Visit  Medication Sig Dispense Refill  . acetaminophen (TYLENOL) 325 MG tablet Take 650 mg by mouth at bedtime as needed for moderate pain.     Marland Kitchen apixaban (ELIQUIS) 5 MG TABS tablet Take 1 tablet (5 mg total) by mouth 2 (two) times daily.  Starting 02/10/2017 60 tablet 11  . Barberry-Oreg Grape-Goldenseal (BERBERINE COMPLEX) 200-200-50 MG CAPS Take by mouth 2 (two) times daily.    . Cholecalciferol (VITAMIN D) 2000 units CAPS Take 4,000 Units by mouth daily.    . diclofenac (FLECTOR) 1.3 % PTCH Place 1 patch onto the skin daily as needed (pain).    . furosemide (LASIX) 20 MG tablet Take 20-40 mg by mouth daily as needed for fluid or edema.   0  . ibuprofen (ADVIL,MOTRIN) 600 MG tablet 1 by mouth every 6 hours as needed 20 tablet 0  . metoprolol (LOPRESSOR) 50 MG tablet TAKE 1 TABLET BY MOUTH TWICE A DAY, CAN TAKE AN EXTRA 25 MG (1/2 TABLET) AS NEEDED FOR AFIB EPISODES 225 tablet 1  . Multiple Vitamin (MULTIVITAMIN WITH MINERALS) TABS tablet Take 1 tablet by mouth daily.    . traMADol (ULTRAM) 50 MG tablet Take 50 mg by mouth at bedtime as needed for moderate pain.     . metFORMIN (GLUCOPHAGE) 500 MG tablet Take 500 mg by mouth 2 (two) times daily.  2   No facility-administered medications prior to visit.      Allergies:   Bee venom; Hydrocodone-homatropine; Lipitor [atorvastatin]; Proair hfa [albuterol]; and Xarelto [rivaroxaban]   Social History   Socioeconomic History  . Marital status: Widowed    Spouse name: Not on file  . Number of children: 1  . Years  of education: Not on file  . Highest education level: Not on file  Occupational History  . Not on file  Social Needs  . Financial resource strain: Not on file  . Food insecurity:    Worry: Not on file    Inability: Not on file  . Transportation needs:    Medical: Not on file    Non-medical: Not on file  Tobacco Use  . Smoking status: Former Smoker    Packs/day: 0.50    Years: 20.00    Pack years: 10.00    Types: Cigarettes    Last attempt to quit: 07/20/2005    Years since quitting: 11.9  . Smokeless tobacco: Never Used  Substance and Sexual Activity  . Alcohol use: No  . Drug use: No  . Sexual activity: Not on file  Lifestyle  . Physical activity:     Days per week: Not on file    Minutes per session: Not on file  . Stress: Not on file  Relationships  . Social connections:    Talks on phone: Not on file    Gets together: Not on file    Attends religious service: Not on file    Active member of club or organization: Not on file    Attends meetings of clubs or organizations: Not on file    Relationship status: Not on file  Other Topics Concern  . Not on file  Social History Narrative  . Not on file     Family History:  The patient's family history includes Breast cancer in her mother; COPD in her father; Heart attack in her brother and father; Heart disease in her sister; Lung cancer in her father.   ROS:   Please see the history of present illness.    Some lower extremity swelling.  This punctuated significantly.  She uses furosemide in the summertime to prevent the lower extremity swelling. All other systems reviewed and are negative.   PHYSICAL EXAM:   VS:  BP (!) 152/74   Pulse 67   Ht  (1.778 m)   Wt 224 lb (101.6 kg)   SpO2 97%   BMI 32.14 kg/m    GEN: Well nourished, well developed, in no acute distress  HEENT: normal  Neck: no JVD, carotid bruits, or masses Cardiac: RRR; no murmurs, rubs, or gallops.  There is trace to 1+ bilateral lower extremity edema. Respiratory:  clear to auscultation bilaterally, normal work of breathing GI: soft, nontender, nondistended, + BS MS: no deformity or atrophy  Skin: warm and dry, no rash Neuro:  Alert and Oriented x 3, Strength and sensation are intact Psych: euthymic mood, full affect  Wt Readings from Last 3 Encounters:  07/06/17 224 lb (101.6 kg)  04/05/17 220 lb (99.8 kg)  02/09/17 220 lb (99.8 kg)      Studies/Labs Reviewed:   EKG:  EKG  Not done  Recent Labs: 04/05/2017: BUN 10; Creatinine, Ser 0.89; Hemoglobin 12.1; Platelets 290; Potassium 4.3; Sodium 137   Lipid Panel    Component Value Date/Time   CHOL 210 (H) 04/22/2013 2306   TRIG 200 (H)  04/22/2013 2306   HDL 32 (L) 04/22/2013 2306   CHOLHDL 6.6 04/22/2013 2306   VLDL 40 04/22/2013 2306   LDLCALC 138 (H) 04/22/2013 2306    Additional studies/ records that were reviewed today include:  No data that is new.    ASSESSMENT:    1. PAF (paroxysmal atrial fibrillation)/atrial flutter   2. Peripheral  vascular disease (HCC)   3. Non-rheumatic mitral regurgitation   4. Other hyperlipidemia   5. Chronic anticoagulation   6. Essential hypertension      PLAN:  In order of problems listed above:  1. Symptomatic recurrences of atrial fibrillation requiring emergency room visits.  A. fib typically resolves without specific management as it did on 3 occasions since I last saw her.  I discussed starting antiarrhythmic therapy, Multitak, to prevent recurrences.  She is not decided about whether or not that should be pursued.  Does not want to be on additional medicines if possible. 2. Asymptomatic 3. No significant murmur 4. Followed by primary care 5. Continue Eliquis 5 mg twice daily. 6. Elevated.  Low-salt diet recommended.  HCTZ 12.5 mg is being recommended.  Blood pressure is elevated today.  Add HCTZ 12.5 mg/day.  Blood pressure clinic in 4 to 8 weeks to ensure blood pressure control.    Medication Adjustments/Labs and Tests Ordered: Current medicines are reviewed at length with the patient today.  Concerns regarding medicines are outlined above.  Medication changes, Labs and Tests ordered today are listed in the Patient Instructions below. There are no Patient Instructions on file for this visit.   Signed, [name], MD  07/06/2017 8:52 AM    [facility] [address] [place], [place], [place]  [address] Phone: [phone]; Fax: [phone]

## 2017-07-06 ENCOUNTER — Ambulatory Visit: Payer: Medicare Other | Admitting: Interventional Cardiology

## 2017-07-06 ENCOUNTER — Encounter: Payer: Self-pay | Admitting: Interventional Cardiology

## 2017-07-06 ENCOUNTER — Encounter (INDEPENDENT_AMBULATORY_CARE_PROVIDER_SITE_OTHER): Payer: Self-pay

## 2017-07-06 VITALS — BP 152/74 | HR 67 | Ht 70.0 in | Wt 224.0 lb

## 2017-07-06 DIAGNOSIS — I10 Essential (primary) hypertension: Secondary | ICD-10-CM

## 2017-07-06 DIAGNOSIS — I739 Peripheral vascular disease, unspecified: Secondary | ICD-10-CM

## 2017-07-06 DIAGNOSIS — I340 Nonrheumatic mitral (valve) insufficiency: Secondary | ICD-10-CM | POA: Diagnosis not present

## 2017-07-06 DIAGNOSIS — E7849 Other hyperlipidemia: Secondary | ICD-10-CM

## 2017-07-06 DIAGNOSIS — I480 Paroxysmal atrial fibrillation: Secondary | ICD-10-CM | POA: Diagnosis not present

## 2017-07-06 DIAGNOSIS — Z7901 Long term (current) use of anticoagulants: Secondary | ICD-10-CM

## 2017-07-06 MED ORDER — HYDROCHLOROTHIAZIDE 12.5 MG PO CAPS: 12.5000 mg | ORAL_CAPSULE | Freq: Every day | ORAL | 3 refills | Status: DC

## 2017-07-06 NOTE — Patient Instructions (Signed)
Medication Instructions:  Start Hydrochlorthiazide 12.5mg  once a day.   Labwork: -Your physician recommends that you return for lab work in: 7-10 days.(bmet)   Testing/Procedures: None ordered  Follow-Up: Your physician recommends that you schedule a follow-up appointment in: 4-8 weeks with Hypertension Clinic.  Your physician wants you to follow-up in: in 8-12 months with Dr. Katrinka BlazingSmith. You will receive a reminder letter in the mail two months in advance. If you don't receive a letter, please call our office to schedule the follow-up appointment.   Any Other Special Instructions Will Be Listed Below (If Applicable).     If you need a refill on your cardiac medications before your next appointment, please call your pharmacy.

## 2017-07-13 ENCOUNTER — Other Ambulatory Visit: Payer: Medicare Other

## 2017-08-03 ENCOUNTER — Ambulatory Visit: Payer: Medicare Other

## 2017-10-02 ENCOUNTER — Telehealth: Payer: Self-pay | Admitting: Interventional Cardiology

## 2017-10-02 NOTE — Telephone Encounter (Signed)
Spoke with patient and she states that she is in the donut hole. She has called her insurance company and they gave her the contact information for patient assistance. She called them and they are sending her an application which she will receive in 3-5 days. I made her aware that I could place two weeks of samples at the front desk and once she returns the completed application we would be able to provide her with a few more samples if we have them available. Patient verbalized her understanding and appreciation.

## 2017-10-02 NOTE — Telephone Encounter (Signed)
New Message    Patient calling the office for samples of medication:   1.  What medication and dosage are you requesting samples for? Eliquis  2.  Are you currently out of this medication? 3 DAYS REMAINING

## 2017-10-10 ENCOUNTER — Other Ambulatory Visit: Payer: Self-pay | Admitting: Family Medicine

## 2017-10-10 DIAGNOSIS — E2839 Other primary ovarian failure: Secondary | ICD-10-CM

## 2017-11-26 ENCOUNTER — Telehealth: Payer: Self-pay | Admitting: Interventional Cardiology

## 2017-11-26 NOTE — Telephone Encounter (Signed)
Spoke with patient who is wanting to make Dr Katrinka Blazing aware she is going to have platelet-rich plasma injections into her knees and needs to be off Eliquis for a total of 6 days (3 days before and 3 days after).  She has a form she is going to fax over for Dr Katrinka Blazing to give her clearance to hold Eliquis.   She also reports only having 2 tablets left,  is in the doughnut hole and can't afford to pay for it now.  She reports having received paperwork for assistance but she has not completed it.  Advised pt to find her paperwork and complete it so that we can try to help her obtain her medication otherwise she will need to be changed over to Coumadin.  Pt states she will work on getting it completed.  She is aware being off anticoagulation puts her at higher risk for CVA in the setting of At Fib.

## 2017-11-26 NOTE — Telephone Encounter (Signed)
New message    Pt is calling asking for a call back. She said it's about clearance. I informed pt the office needs to contact our office to get clearance but she said she would still like to speak to the nurse.

## 2017-11-28 ENCOUNTER — Telehealth: Payer: Self-pay | Admitting: *Deleted

## 2017-11-28 DIAGNOSIS — I10 Essential (primary) hypertension: Secondary | ICD-10-CM

## 2017-11-28 NOTE — Telephone Encounter (Signed)
   [facility] Pre-operative Risk Assessment    Request for surgical clearance:  1. What type of surgery is being performed? Platelet Rich Plasma (cervical & knees)   2. When is this surgery scheduled? TBD   3. What type of clearance is required (medical clearance vs. Pharmacy clearance to hold med vs. Both)? Pharmacy  4. Are there any medications that need to be held prior to surgery and how long? Eliquis - stop 3 days before and 3 days after   5. Practice name and name of physician performing surgery?  [facility]   6. What is your office phone number [phone]    7.   What is your office fax number [phone]  8.   Anesthesia type (None, local, MAC, general) ? None noted   Rodman Key 11/28/2017, 3:48 PM  _________________________________________________________________   (provider comments below)

## 2017-11-28 NOTE — Telephone Encounter (Signed)
Okay to hold Eliquis as requested.  Agree with encouragement to complete forms for assistance.

## 2017-11-29 DIAGNOSIS — I10 Essential (primary) hypertension: Secondary | ICD-10-CM | POA: Insufficient documentation

## 2017-11-29 NOTE — Telephone Encounter (Signed)
Pt takes Eliquis for afib with CHADS2VASc score of 5 (age, sex, DM, CVD, HTN). Renal function is normal. Dr Katrinka Blazing provided clearance in phone note from 10/21 for pt to hold Eliquis as requested 3 days pre and post procedure.

## 2017-11-29 NOTE — Telephone Encounter (Signed)
OK to hold Eliquis 3 days pre and post op if needed for planned procedure.  Corine Shelter PA-C 11/29/2017 3:00 PM

## 2017-11-30 NOTE — Telephone Encounter (Signed)
I have done an Eliquis tier exception through covermymeds. Key: [id number]

## 2017-11-30 NOTE — Telephone Encounter (Signed)
I called [facility], the pts pharmacy, and was advised that the cost to the pt for a 30 day supply of Eliquis is $55.82 and that a PA is not needed at this time as her insurance is paying their part.  The pt is not eligible for pt assistance as she has insurance coverage at this time. She can apply once she goes into the donut hole.  I attempted to call the pt but got no answer and there is no way to leave a message.  I will attempt an Eliquis tier exception.

## 2017-11-30 NOTE — Telephone Encounter (Signed)
Brittany Ford, is there anything else we can do to help pt obtain Eliquis?  Thank you for looking into this and all you do!

## 2017-12-03 NOTE — Telephone Encounter (Signed)
Letter received via fax from [employer] stating that have denied the pts Eliquis tier exception. Reason: Eliquis is already at a tier 3 and cannot be lowered any further as it is a name brand medication.  Again, I attempted to contact the pt but got no answer at her home # and here is no way to leave a message.

## 2017-12-12 ENCOUNTER — Telehealth: Payer: Self-pay | Admitting: Interventional Cardiology

## 2017-12-12 NOTE — Telephone Encounter (Signed)
New message:       Pt c/o medication issue:  1. Name of Medication: Eliquis  2. How are you currently taking this medication (dosage and times per day)?   3. Are you having a reaction (difficulty breathing--STAT)?No  4. What is your medication issue? Pt is calling in reference to this medication and then would also like to discuss and upcoming procedure she is due to have.

## 2017-12-12 NOTE — Telephone Encounter (Signed)
Pt called in asking if clearance to come off medication has been sent.   I made her aware she will need to leave message with Nurse. She stated no need in leaving message she gave me a different fax number for clearance to be faxed.    Clearance to be faxed to [facility] @ [phone].

## 2017-12-12 NOTE — Telephone Encounter (Signed)
Printed off clearance note as advised by Corine Shelter PA-C on 11/29/17, to contact information provided by the pt.  Advised the pt that if they still haven't received this note, they are to contact our office and provide a different fax number.  Pt verbalized understanding and agrees with this plan.

## 2017-12-13 ENCOUNTER — Telehealth: Payer: Self-pay | Admitting: Interventional Cardiology

## 2017-12-13 NOTE — Telephone Encounter (Signed)
Spoke with pt and she states that [facility] still hasn't received our clearance form.  Advised I will fax to them again.  Pt also mentioned being in the donut hole and needing Eliquis.  Advised I will place 2 weeks worth of medication at the front desk for her to pick up.  Pt very appreciative for call.

## 2017-12-13 NOTE — Telephone Encounter (Signed)
Called [facility] and they did receive the clearance.

## 2017-12-13 NOTE — Telephone Encounter (Signed)
Follow Up:   Patient stated that Dr. Katrinka Blazing stated for her to speak with the nurse.please call Patient.

## 2018-01-21 ENCOUNTER — Telehealth: Payer: Self-pay | Admitting: Interventional Cardiology

## 2018-01-21 NOTE — Telephone Encounter (Signed)
New Message ° ° °Patient calling the office for samples of medication: ° ° °1.  What medication and dosage are you requesting samples for? apixaban (ELIQUIS) 5 MG TABS tablet ° °2.  Are you currently out of this medication?  yes ° ° °

## 2018-01-21 NOTE — Telephone Encounter (Signed)
Called pt to inform pt that I was leaving a box of Eliquis 5 mg tablet, a week's supply at the front desk for pt to pick up and I explained to pt that we did not have a lot of samples and that the samples were for pts that are just starting the medication and that the pt could call back later this week to see about getting more samples. I advised the pt that if she has any other problems, questions or concerns to call the office. Pt verbalized understanding.

## 2018-01-28 ENCOUNTER — Telehealth: Payer: Self-pay | Admitting: Interventional Cardiology

## 2018-01-28 NOTE — Telephone Encounter (Signed)
New Message   Patient calling the office for samples of medication:   1.  What medication and dosage are you requesting samples for? Eliquis 5MG   2.  Are you currently out of this medication? Yes

## 2018-01-28 NOTE — Telephone Encounter (Signed)
Called pt to inform her that we do not have samples of Eliquis and pt wanted Brittany Ford's Brittany Bowers, LPN to give her a call back. Please address

## 2018-01-28 NOTE — Telephone Encounter (Signed)
Attempted to call pt back - No answer and no VM 

## 2018-01-31 NOTE — Telephone Encounter (Signed)
Spoke with pt and advised that we do not have samples at this time.  Advised pt to try PCP office.  She said she did and they didn't have any either.  Advised pt to contact Pharmacy and see how much it would cost out of pocket to get enough medication until the new year.  Advised to call if unable to get.  Pt verbalized understanding and was in agreement with this plan.

## 2018-02-14 ENCOUNTER — Other Ambulatory Visit: Payer: Self-pay | Admitting: Interventional Cardiology

## 2018-02-14 DIAGNOSIS — I480 Paroxysmal atrial fibrillation: Secondary | ICD-10-CM

## 2018-02-14 MED ORDER — APIXABAN 5 MG PO TABS: 5.0000 mg | ORAL_TABLET | Freq: Two times a day (BID) | ORAL | 5 refills | Status: DC

## 2018-02-14 NOTE — Telephone Encounter (Signed)
 *  STAT* If patient is at the pharmacy, call can be transferred to refill team.   1. Which medications need to be refilled? (please list name of each medication and dose if known) apixaban (ELIQUIS) 5 MG TABS tablet  2. Which pharmacy/location (including street and city if local pharmacy) [facility]  3. Do they need a 30 day or 90 day supply? 30

## 2018-02-14 NOTE — Telephone Encounter (Signed)
Pt last saw Dr Katrinka Blazing 07/06/17, last labs 04/05/17 Creat 0.89, age 73, weight 101.6kg, based on specified criteria pt is on appropriate dosage of Eliquis 5mg  BID.  Will refill rx.

## 2018-04-02 ENCOUNTER — Telehealth: Payer: Self-pay | Admitting: Interventional Cardiology

## 2018-04-02 NOTE — Telephone Encounter (Signed)
Pt takes Eliquis for afib with CHADS2VASc score of 5 (age, sex, HTN, DM, PVD). Renal function is normal. Ok to hold Eliquis for 3 days prior to spinal procedure. 

## 2018-04-02 NOTE — Telephone Encounter (Signed)
   Primary Cardiologist: [name], MD  Chart reviewed as part of pre-operative protocol coverage. Patient was contacted 04/02/2018 in reference to pre-operative risk assessment for pending surgery as outlined below.  Brittany Ford was last seen on 07/06/2017 by Dr. Katrinka Blazing.  Since that day, Brittany Ford has done relatively well. She still has some occ heart flutters accompanied by nausea but these are self limiting. No acute symptoms to preclude her from having this procedure.   Therefore, based on ACC/AHA guidelines, the patient would be at acceptable risk for the planned procedure without further cardiovascular testing.   According to our pharmacy protocol: Pt takes Eliquis for afib with CHADS2VASc score of 5 (age, sex, HTN, DM, PVD). Renal function is normal. Ok to hold Eliquis for 3 days prior to spinal procedure.  I will route this recommendation to the requesting party via Epic fax function and remove from pre-op pool.  Please call with questions.  Berton Bon, NP 04/02/2018, 11:33 AM

## 2018-04-02 NOTE — Telephone Encounter (Signed)
New Message      [facility] Pre-operative Risk Assessment    Request for surgical clearance:  1. What type of surgery is being performed? Right L4 35 TSESI  2. When is this surgery scheduled? 04/10/2018   3. What type of clearance is required (medical clearance vs. Pharmacy clearance to hold med vs. Both)? Both   4. Are there any medications that need to be held prior to surgery and how long? Eliquis hold for 3 days    5. Practice name and name of physician performing surgery? [facility] Dr. Micheline Chapman    6. What is your office phone number [phone]    7.   What is your office fax number [phone]  8.   Anesthesia type (None, local, MAC, general) ? Local     Brittany Ford 04/02/2018, 8:42 AM  _________________________________________________________________   (provider comments below)

## 2018-04-02 NOTE — Telephone Encounter (Signed)
I will send to preop pharmacist for recommendations on anticoagulation. Pt has been cleared several times in the last year and able to hold A/C.  I will then call pt to review current status and fitness to procedure.

## 2018-04-03 NOTE — Telephone Encounter (Signed)
Follow up   Per Kia at [facility] she states that she has not received the fax back for the clearance per the previous message. Please refax to the fax # that is on file.

## 2018-04-03 NOTE — Telephone Encounter (Signed)
Re faxing clearance to [phone]

## 2018-04-05 NOTE — Telephone Encounter (Signed)
Forwarded to requesting party via EPIC fax function 

## 2018-04-09 NOTE — Telephone Encounter (Signed)
Spoke with Kia from requesting office and she states that they have received clearance but pt cancelled her surgery

## 2018-04-17 ENCOUNTER — Telehealth: Payer: Self-pay | Admitting: *Deleted

## 2018-04-17 NOTE — Telephone Encounter (Signed)
   [facility] Pre-operative Risk Assessment    Request for surgical clearance:  1. What type of surgery is being performed? LUMBAR EPIDURAL   2. When is this surgery scheduled? TBD; PT WILL BE HAVING A SERIES OF INJECTIONS 2 WEEKS APART  3. What type of clearance is required (medical clearance vs. Pharmacy clearance to hold med vs. Both)? BOTH  4. Are there any medications that need to be held prior to surgery and how long?ELIQUIS 2 DAYS PRIOR   5. Practice name and name of physician performing surgery? [facility]; DR. Ron Agee   6. What is your office phone number [phone]    7.   What is your office fax number [phone]  8.   Anesthesia type (None, local, MAC, general) ? NONE LISTED    Julaine Hua 04/17/2018, 12:44 PM  _________________________________________________________________   (provider comments below)

## 2018-04-17 NOTE — Telephone Encounter (Signed)
   Primary Cardiologist: [name], MD  Chart reviewed as part of pre-operative protocol coverage. Patient was contacted 04/17/2018 in reference to pre-operative risk assessment for pending surgery as outlined below.  Quenna Bethea-Wilson was last seen on 07/06/17 by Dr. Katrinka Blazing.  Since that day, Aroura Appleyard has done well with occ palpitation. No chest pain.  Back pain is her complaint. .  Therefore, based on ACC/AHA guidelines, the patient would be at acceptable risk for the planned procedure without further cardiovascular testing.   Note    Pt takes Eliquis for afib with CHADS2VASc score of 5 (age, sex, HTN, DM, PVD). Renal function is normal. Ok to hold Eliquis for 3 days prior to spinal procedure       I will route this recommendation to the requesting party via Epic fax function and remove from pre-op pool.  Please call with questions.  Nada Boozer, NP 04/17/2018, 5:10 PM

## 2018-04-17 NOTE — Telephone Encounter (Signed)
Pt takes Eliquis for afib with CHADS2VASc score of 5 (age, sex, HTN, DM, PVD). Renal function is normal. Ok to hold Eliquis for 3 days prior to spinal procedure.

## 2018-04-26 ENCOUNTER — Other Ambulatory Visit: Payer: Self-pay | Admitting: *Deleted

## 2018-04-26 DIAGNOSIS — I480 Paroxysmal atrial fibrillation: Secondary | ICD-10-CM

## 2018-04-26 MED ORDER — APIXABAN 5 MG PO TABS: 5.0000 mg | ORAL_TABLET | Freq: Two times a day (BID) | ORAL | 2 refills | Status: DC

## 2018-04-26 NOTE — Telephone Encounter (Signed)
Eliquis 5mg  refill request received; pt is 73 yrs old, wt-101.6kg, Crea-0.80 on 10/05/2017 via [place], last seen by Dr. Katrinka Blazing on 07/06/2017; will send in refill to requested pharmacy.

## 2018-06-11 ENCOUNTER — Telehealth: Payer: Self-pay | Admitting: Nurse Practitioner

## 2018-06-11 NOTE — Telephone Encounter (Signed)
   [facility] Pre-operative Risk Assessment    **SEE previous clearance request which states patient will be having series of 3 injections  Request for surgical clearance:  1. What type of surgery is being performed? Lumbar Epidural injection    2. When is this surgery scheduled? TBD  3. What type of clearance is required (medical clearance vs. Pharmacy clearance to hold med vs. Both)? Pharmacy  4. Are there any medications that need to be held prior to surgery and how long? Hold Eliquis 2 days prior to injection; injections scheduled 2 weeks apart  5. Practice name and name of physician performing surgery? Dr. Ron Agee [facility]  6. What is your office phone number [phone]   7.   What is your office fax number [phone] ATTN: Xray  8.   Anesthesia type (None, local, MAC, general) ? Not Indicated   Brittany Ford 06/11/2018, 5:17 PM  _________________________________________________________________   (provider comments below)

## 2018-06-12 NOTE — Telephone Encounter (Signed)
Patient with diagnosis of atrial fibrillation on apixaban for anticoagulation.    Procedure: Lumbar Epidural injection  Date of procedure: TBD  CHADS2-VASc score of 5  (HTN, AGE, DM2, CAD, female)  CrCl 92 ml/min Platelet count 290  Per office protocol, patient can hold eliquis for 3 days prior to each procedure.    Gwynneth Albright, Byrd.Champagne D PGY1 Pharmacy Resident  06/12/2018   8:41 AM

## 2018-06-12 NOTE — Telephone Encounter (Signed)
   Primary Cardiologist: [name], MD  Chart reviewed as part of pre-operative protocol coverage. Per pharmacy recommendations, patient can hold eliquis 3 days prior to his epidural injection. Patient should restart this medication when cleared to do so by Dr. Maurice Small.   I will route this recommendation to the requesting party via Epic fax function and remove from pre-op pool.  Please call with questions.  Beatriz Stallion, PA-C 06/12/2018, 9:02 AM

## 2018-06-21 ENCOUNTER — Telehealth: Payer: Self-pay | Admitting: Interventional Cardiology

## 2018-06-21 NOTE — Telephone Encounter (Signed)
I spoke to the patient who said that someone had called twice, bit she is not sure who it may have been.  I told her that I would forward to Dr Michaelle Copas nurse, to see if she may have an idea.

## 2018-06-21 NOTE — Telephone Encounter (Signed)
Pt contacted by Alonna Minium.  Refused APP appt and scheduled to see Dr. Katrinka Blazing in Sept.

## 2018-06-21 NOTE — Telephone Encounter (Signed)
New Message:    Patient stating that some one called her twice. I did not see a note. Please call patient back.

## 2018-09-23 ENCOUNTER — Other Ambulatory Visit: Payer: Self-pay

## 2018-09-23 DIAGNOSIS — I480 Paroxysmal atrial fibrillation: Secondary | ICD-10-CM

## 2018-09-23 MED ORDER — APIXABAN 5 MG PO TABS
5.0000 mg | ORAL_TABLET | Freq: Two times a day (BID) | ORAL | 0 refills | Status: DC
Start: 2018-09-23 — End: 2018-11-14

## 2018-09-23 NOTE — Telephone Encounter (Signed)
15f 101.6kg Scr 0.8 10/05/17 Lovw/smith 07/06/17

## 2018-10-08 ENCOUNTER — Telehealth: Payer: Self-pay

## 2018-10-08 NOTE — Telephone Encounter (Signed)
LVMTCB regarding upcoming appointment. Need to change from in office visit to virtual appointment with Dr Tamala Julian 10/10/2018. Need verbal consent for virtual visit.

## 2018-10-10 ENCOUNTER — Ambulatory Visit: Payer: Medicare Other | Admitting: Interventional Cardiology

## 2018-11-13 ENCOUNTER — Other Ambulatory Visit: Payer: Self-pay | Admitting: Interventional Cardiology

## 2018-11-13 DIAGNOSIS — I480 Paroxysmal atrial fibrillation: Secondary | ICD-10-CM

## 2018-11-14 NOTE — Telephone Encounter (Signed)
Eliquis 5mg  refill request received, pt is 73yrs old, weight-101.6kg, Crea-0.89 on 04/05/2017-needs labs & will note on in office that is scheduled for 12/19/2018, Diagnosis-AFIB, and last seen by Dr. Tamala Julian on 07/06/2017-overdue and now has an appt set for 12/19/2018. Dose is appropriate based on dosing criteria. Will send in refill to requested pharmacy for a limited supply as pt needs labs and an appt.

## 2018-12-18 NOTE — Progress Notes (Signed)
Cardiology Office Note:    Date:  12/19/2018   ID:  Brittany Ford, DOB 1945-07-21, MRN [id number]  PCP:  Juluis Rainier, MD  Cardiologist:  [name], MD   Referring MD: Juluis Rainier, MD   Chief Complaint  Patient presents with   Atrial Fibrillation    History of Present Illness:    Brittany Ford is a 73 y.o. female with a hx of paroxysmal atrial fibrillation, anticoagulation, and DM II.  Brittany Ford is having increasingly frequent episodes of atrial for ablation.  When these episodes occur, she has palpitations, dizziness, loss of energy, and needs to sit down.  Episodes last minutes and on occasions she has episode after episode.  It is becoming a quality of life issue.  She has not had continuous atrial for ablation for greater than 24 hours.  She denies chest pain.  There is no prior history of coronary disease.  Last ischemic assessment was greater than 5 years ago.  She has no symptoms of heart failure.  Last LV functional study was 2014.  LVEF was greater than 70% at that time.  LVEF by nuclear perfusion study in 2015 was 80%.  She complains of twinges of pain in the left parasternal region associated with left axillary stinging and discomfort.  These are momentary episodes that occur sporadically.  These symptoms are not exertion related.    Past Medical History:  Diagnosis Date   Atrial fibrillation (HCC)    Atrial flutter (HCC)    Chest pain    with typical and atypical qualities   Chronic low back pain    Diabetes mellitus without complication (HCC)    Diet Controlled   Dysrhythmia    ATRIAL FIBRILATION   GERD (gastroesophageal reflux disease)    Obesity     Past Surgical History:  Procedure Laterality Date   BREAST EXCISIONAL BIOPSY Right 1990s   benign   BREAST EXCISIONAL BIOPSY Left 1990s   benign   COLONOSCOPY  03/01/2012   Procedure: COLONOSCOPY;  Surgeon: [name], MD;  Location: [facility] ENDOSCOPY;  Service:  Endoscopy;  Laterality: N/A;   ESOPHAGOGASTRODUODENOSCOPY  02/29/2012   Procedure: ESOPHAGOGASTRODUODENOSCOPY (EGD);  Surgeon: [name], MD;  Location: [facility] ENDOSCOPY;  Service: Endoscopy;  Laterality: N/A;   FOOT SURGERY     x2   HYSTEROSCOPY W/D&C N/A 02/09/2017   Procedure: DILATATION AND CURETTAGE /HYSTEROSCOPY;  Surgeon: Gerald Leitz, MD;  Location: [facility] ORS;  Service: Gynecology;  Laterality: N/A;  w/Myosure for Polyp   TONSILLECTOMY      Current Medications: Current Meds  Medication Sig   acetaminophen (TYLENOL) 325 MG tablet Take 650 mg by mouth at bedtime as needed for moderate pain.    Barberry-Oreg Grape-Goldenseal (BERBERINE COMPLEX) 200-200-50 MG CAPS Take by mouth 2 (two) times daily.   Cholecalciferol (VITAMIN D) 2000 units CAPS Take 4,000 Units by mouth daily.   ELIQUIS 5 MG TABS tablet TAKE 1 TABLET BY MOUTH 2  TIMES DAILY   furosemide (LASIX) 20 MG tablet Take 20-40 mg by mouth daily as needed for fluid or edema.    ibuprofen (ADVIL,MOTRIN) 600 MG tablet 1 by mouth every 6 hours as needed   metoprolol (LOPRESSOR) 50 MG tablet TAKE 1 TABLET BY MOUTH TWICE A DAY, CAN TAKE AN EXTRA 25 MG (1/2 TABLET) AS NEEDED FOR AFIB EPISODES   Multiple Vitamin (MULTIVITAMIN WITH MINERALS) TABS tablet Take 1 tablet by mouth daily.   traMADol (ULTRAM) 50 MG tablet Take 50 mg by mouth  at bedtime as needed for moderate pain.      Allergies:   Bee venom, Hydrocodone-homatropine, Lipitor [atorvastatin], Proair hfa [albuterol], and Xarelto [rivaroxaban]   Social History   Socioeconomic History   Marital status: Widowed    Spouse name: Not on file   Number of children: 1   Years of education: Not on file   Highest education level: Not on file  Occupational History   Not on file  Social Needs   Financial resource strain: Not on file   Food insecurity    Worry: Not on file    Inability: Not on file   Transportation needs    Medical: Not on file    Non-medical: Not on  file  Tobacco Use   Smoking status: Former Smoker    Packs/day: 0.50    Years: 20.00    Pack years: 10.00    Types: Cigarettes    Quit date: 07/20/2005    Years since quitting: 13.4   Smokeless tobacco: Never Used  Substance and Sexual Activity   Alcohol use: No   Drug use: No   Sexual activity: Not on file  Lifestyle   Physical activity    Days per week: Not on file    Minutes per session: Not on file   Stress: Not on file  Relationships   Social connections    Talks on phone: Not on file    Gets together: Not on file    Attends religious service: Not on file    Active member of club or organization: Not on file    Attends meetings of clubs or organizations: Not on file    Relationship status: Not on file  Other Topics Concern   Not on file  Social History Narrative   Not on file     Family History: The patient's family history includes Breast cancer in her mother; COPD in her father; Heart attack in her brother and father; Heart disease in her sister; Lung cancer in her father.  ROS:   Please see the history of present illness.    She does not sleep all night.  She has difficulty falling to sleep.  She feels tired all the time.  She snores.  All other systems reviewed and are negative.  EKGs/Labs/Other Studies Reviewed:    The following studies were reviewed today: No recent imaging data.  EKG:  EKG sinus bradycardia, occasional premature atrial beat, biatrial abnormality.  Otherwise normal in appearance.  Recent Labs: No results found for requested labs within last 8760 hours.  Recent Lipid Panel    Component Value Date/Time   CHOL 210 (H) 04/22/2013 2306   TRIG 200 (H) 04/22/2013 2306   HDL 32 (L) 04/22/2013 2306   CHOLHDL 6.6 04/22/2013 2306   VLDL 40 04/22/2013 2306   LDLCALC 138 (H) 04/22/2013 2306    Physical Exam:    VS:  BP 136/68    Pulse (!) 59    Ht 5\' 10"  (1.778 m)    Wt 217 lb 6.4 oz (98.6 kg)    SpO2 98%    BMI 31.19 kg/m      Wt Readings from Last 3 Encounters:  12/19/18 217 lb 6.4 oz (98.6 kg)  07/06/17 224 lb (101.6 kg)  04/05/17 220 lb (99.8 kg)     GEN: No distress and compatible with stated age.. No acute distress HEENT: Normal NECK: No JVD. LYMPHATICS: No lymphadenopathy CARDIAC: Irregular RRR without murmur, gallop, or edema. VASCULAR:  Normal Pulses.  No bruits. RESPIRATORY:  Clear to auscultation without rales, wheezing or rhonchi  ABDOMEN: Soft, non-tender, non-distended, No pulsatile mass, MUSCULOSKELETAL: No deformity  SKIN: Warm and dry NEUROLOGIC:  Alert and oriented x 3 PSYCHIATRIC:  Normal affect   ASSESSMENT:    1. PAF (paroxysmal atrial fibrillation)/atrial flutter   2. Other hyperlipidemia   3. Hypertension, unspecified type   4. Chronic anticoagulation   5. Snoring   6. Peripheral vascular disease (HCC)   7. Other chest pain   8. Educated about COVID-19 virus infection    PLAN:    In order of problems listed above:  1. Increasingly frequent episodes.  We spent considerable time discussing the management of atrial fib.  Based upon her current complaints, episodes of atrial fibrillation are becoming quality of life reducing.  We will start flecainide 50 mg twice daily.  She will have a myocardial perfusion study done within the next week or 2.  This will also provide updated information on LV function.  She will likely need to have an echocardiogram repeated.  We did discuss ablation which was one of her requests.  A rhythm control strategy is adopted after today's office visit. 2. Current lipid status is not known.  The last LDL was 139 when evaluated in August 2019.  This will need to be assessed.  It is been followed by primary care.  If by the time of the next office visit it has not been performed or scheduled, we would/should consider performing.  She should also have a hemoglobin A1c. 3. Blood pressure is currently adequately controlled. 4. Continue Eliquis and monitor for  bleeding.  A CBC and creatinine is obtained today. 5. Sleep study has been ordered. 6. No claudication or complaints. 7. Sounds atypical but since we are using flecainide, will do a Lexiscan study to rule out evidence of ischemia. 8. The 3W's are understood and being endorsed in her lifestyle.  The natural history of atrial fibrillation was discussed.  The inability to cure and the possibility of recurrences was clearly stated.  Management strategies including rhythm control (antiarrhythmic therapy), rate control (beta-blocker therapy or AV node blocking calcium channel blocker therapy), and ablation and/or pacemaker therapy were reviewed.  Stroke risk (as determined by CHADS VASC score >1) was discussed relative to the patient's individual profile.  The expected duration/permanence of anti-coagulation therapy was determined based on the individual risk score.  Coumadin versus NOAC therapy was reviewed, highlighting the lower bleeding risk and improved safety with NOAC therapy.      Medication Adjustments/Labs and Tests Ordered: Current medicines are reviewed at length with the patient today.  Concerns regarding medicines are outlined above.  Orders Placed This Encounter  Procedures   Basic metabolic panel   Hepatic function panel   CBC   MYOCARDIAL PERFUSION IMAGING   EKG 12-Lead   Split night study   Meds ordered this encounter  Medications   flecainide (TAMBOCOR) 50 MG tablet    Sig: Take 1 tablet (50 mg total) by mouth 2 (two) times daily.    Dispense:  180 tablet    Refill:  3    Patient Instructions  Medication Instructions:  1) START Flecainide 50mg  twice daily  *If you need a refill on your cardiac medications before your next appointment, please call your pharmacy*  Lab Work: BMET, CBC and Liver today  If you have labs (blood work) drawn today and your tests are completely normal, you will receive your results only by:  MyChart  Message (if you have MyChart)  OR  A paper copy in the mail If you have any lab test that is abnormal or we need to change your treatment, we will call you to review the results.  Testing/Procedures: Your physician has requested that you have a lexiscan myoview. For further information please visit [email or web address]. Please follow instruction sheet, as given.  Your physician has recommended that you have a sleep study. This test records several body functions during sleep, including: brain activity, eye movement, oxygen and carbon dioxide blood levels, heart rate and rhythm, breathing rate and rhythm, the flow of air through your mouth and nose, snoring, body muscle movements, and chest and belly movement.   Follow-Up:  Your physician recommends that you schedule a follow-up appointment in: 1 week with an APP to get an EKG after starting Flecainide.    At [facility], you and your health needs are our priority.  As part of our continuing mission to provide you with exceptional heart care, we have created designated Provider Care Teams.  These Care Teams include your primary Cardiologist (physician) and Advanced Practice Providers (APPs -  Physician Assistants and Nurse Practitioners) who all work together to provide you with the care you need, when you need it.  Your next appointment:   2-4 weeks (Can have 12/2 at 4:20PM or 12/3 at 10A)  The format for your next appointment:   In Person  Provider:   You may see Sinclair Grooms, MD or one of the following Advanced Practice Providers on your designated Care Team:    Truitt Merle, NP  Cecilie Kicks, NP  Kathyrn Drown, NP   Other Instructions      Signed, Sinclair Grooms, MD  12/19/2018 10:05 AM    [place]

## 2018-12-19 ENCOUNTER — Encounter: Payer: Self-pay | Admitting: *Deleted

## 2018-12-19 ENCOUNTER — Ambulatory Visit: Payer: Medicare Other | Admitting: Interventional Cardiology

## 2018-12-19 ENCOUNTER — Encounter: Payer: Self-pay | Admitting: Interventional Cardiology

## 2018-12-19 ENCOUNTER — Other Ambulatory Visit: Payer: Self-pay

## 2018-12-19 VITALS — BP 136/68 | HR 59 | Ht 70.0 in | Wt 217.4 lb

## 2018-12-19 DIAGNOSIS — E7849 Other hyperlipidemia: Secondary | ICD-10-CM

## 2018-12-19 DIAGNOSIS — I480 Paroxysmal atrial fibrillation: Secondary | ICD-10-CM | POA: Diagnosis not present

## 2018-12-19 DIAGNOSIS — Z7901 Long term (current) use of anticoagulants: Secondary | ICD-10-CM

## 2018-12-19 DIAGNOSIS — R0789 Other chest pain: Secondary | ICD-10-CM

## 2018-12-19 DIAGNOSIS — I10 Essential (primary) hypertension: Secondary | ICD-10-CM

## 2018-12-19 DIAGNOSIS — I739 Peripheral vascular disease, unspecified: Secondary | ICD-10-CM

## 2018-12-19 DIAGNOSIS — R0683 Snoring: Secondary | ICD-10-CM

## 2018-12-19 DIAGNOSIS — Z7189 Other specified counseling: Secondary | ICD-10-CM

## 2018-12-19 LAB — CBC
Hematocrit: 38.3 % (ref 34.0–46.6)
Hemoglobin: 12.7 g/dL (ref 11.1–15.9)
MCH: 27.6 pg (ref 26.6–33.0)
MCHC: 33.2 g/dL (ref 31.5–35.7)
MCV: 83 fL (ref 79–97)
Platelets: 307 10*3/uL (ref 150–450)
RBC: 4.6 x10E6/uL (ref 3.77–5.28)
RDW: 13.8 % (ref 11.7–15.4)
WBC: 8.7 10*3/uL (ref 3.4–10.8)

## 2018-12-19 LAB — BASIC METABOLIC PANEL
BUN/Creatinine Ratio: 13 (ref 12–28)
BUN: 13 mg/dL (ref 8–27)
CO2: 22 mmol/L (ref 20–29)
Calcium: 9.5 mg/dL (ref 8.7–10.3)
Chloride: 102 mmol/L (ref 96–106)
Creatinine, Ser: 0.98 mg/dL (ref 0.57–1.00)
GFR calc Af Amer: 67 mL/min/{1.73_m2} (ref >59)
GFR calc non Af Amer: 58 mL/min/{1.73_m2} — ABNORMAL LOW (ref >59)
Glucose: 310 mg/dL — ABNORMAL HIGH (ref 65–99)
Potassium: 4.6 mmol/L (ref 3.5–5.2)
Sodium: 138 mmol/L (ref 134–144)

## 2018-12-19 LAB — HEPATIC FUNCTION PANEL
ALT: 10 IU/L (ref 0–32)
AST: 14 IU/L (ref 0–40)
Albumin: 4.2 g/dL (ref 3.7–4.7)
Alkaline Phosphatase: 100 IU/L (ref 39–117)
Bilirubin Total: 0.2 mg/dL (ref 0.0–1.2)
Bilirubin, Direct: 0.07 mg/dL (ref 0.00–0.40)
Total Protein: 6.8 g/dL (ref 6.0–8.5)

## 2018-12-19 MED ORDER — FLECAINIDE ACETATE 50 MG PO TABS: 50.0000 mg | ORAL_TABLET | Freq: Two times a day (BID) | ORAL | 3 refills | Status: DC

## 2018-12-19 NOTE — Patient Instructions (Addendum)
Medication Instructions:  1) START Flecainide 50mg  twice daily  *If you need a refill on your cardiac medications before your next appointment, please call your pharmacy*  Lab Work: BMET, CBC and Liver today  If you have labs (blood work) drawn today and your tests are completely normal, you will receive your results only by: Marland Kitchen MyChart Message (if you have MyChart) OR . A paper copy in the mail If you have any lab test that is abnormal or we need to change your treatment, we will call you to review the results.  Testing/Procedures: Your physician has requested that you have a lexiscan myoview. For further information please visit [email or web address]. Please follow instruction sheet, as given.  Your physician has recommended that you have a sleep study. This test records several body functions during sleep, including: brain activity, eye movement, oxygen and carbon dioxide blood levels, heart rate and rhythm, breathing rate and rhythm, the flow of air through your mouth and nose, snoring, body muscle movements, and chest and belly movement.  Your physician has requested that you have an echocardiogram. Echocardiography is a painless test that uses sound waves to create images of your heart. It provides your doctor with information about the size and shape of your heart and how well your heart's chambers and valves are working. This procedure takes approximately one hour. There are no restrictions for this procedure.  Follow-Up:  Your physician recommends that you schedule a follow-up appointment in: 1 week with an APP to get an EKG after starting Flecainide.    At [facility], you and your health needs are our priority.  As part of our continuing mission to provide you with exceptional heart care, we have created designated Provider Care Teams.  These Care Teams include your primary Cardiologist (physician) and Advanced Practice Providers (APPs -  Physician Assistants and Nurse  Practitioners) who all work together to provide you with the care you need, when you need it.  Your next appointment:   2-4 weeks (Can have 12/2 at 4:20PM or 12/3 at 10A)  The format for your next appointment:   In Person  Provider:   You may see Sinclair Grooms, MD or one of the following Advanced Practice Providers on your designated Care Team:    Truitt Merle, NP  Cecilie Kicks, NP  Kathyrn Drown, NP   Other Instructions

## 2018-12-20 ENCOUNTER — Telehealth: Payer: Self-pay | Admitting: *Deleted

## 2018-12-20 NOTE — Telephone Encounter (Signed)
-----   Message from Thayer Headings sent at 12/19/2018 11:06 AM EST ----- Regarding: Sleep Study Referral for sleep study per Dr. Tamala Julian.

## 2018-12-20 NOTE — Telephone Encounter (Signed)
Staff message sent to Gae Bon ok to schedule sleep study. Per [facility] web portal no PA is required. Decision [id number].

## 2018-12-23 ENCOUNTER — Telehealth: Payer: Self-pay | Admitting: *Deleted

## 2018-12-23 NOTE — Telephone Encounter (Signed)
Patient is scheduled for lab study on 01/08/19. Brittany Ford is scheduled for COVID screening on 11/30 1pm prior to her sleep study. Patient understands her sleep study will be done at [facility] sleep lab. Patient understands she will receive a sleep packet in a week or so. Patient understands to call if she does not receive the sleep packet in a timely manner.  Left detailed message on voicemail with date and time of titration and informed patient to call back to confirm or reschedule.

## 2018-12-23 NOTE — Telephone Encounter (Signed)
-----   Message from Lauralee Evener, [place] sent at 12/20/2018  3:36 PM EST ----- Regarding: RE: precert Ok to schedule. Per [facility] web portal no PA is required. Decision [id number]. ----- Message ----- From: Freada Bergeron, CMA Sent: 12/20/2018   2:34 PM EST To: [facility] Sleep Studies Subject: precert                                         ----- Message ----- From: Thayer Headings Sent: 12/19/2018  11:06 AM EST To: Freada Bergeron, CMA Subject: Sleep Study                                    Referral for sleep study per Dr. Tamala Julian.

## 2018-12-25 NOTE — Progress Notes (Signed)
Cardiology Office Note    Date:  12/26/2018   ID:  Brittany Ford, DOB Feb 07, 1945, MRN [id number]  PCP:  Juluis Rainier, MD  Cardiologist: Dr. Katrinka Blazing  Chief Complaint: 1 week follow up  History of Present Illness:   Brittany Ford is a 73 y.o. female with hx of PAF and DM seen for follow up.  Seen by Dr. Katrinka Blazing 12/19/2018. She is having increasingly frequent episodes of atrial fib.  When these episodes occur, she has palpitations, dizziness, loss of energy, and needs to sit down.  Episodes last minutes and on occasions she has episode after episode.  It is becoming a quality of life issue.  She has not had continuous atrial fib for greater than 24 hours. No prior hx of CAD. Started flecainide. Pending sleep study, echo and myoview.   Seen today for follow up.  In sinus rhythm today.  She reports only brief episode of atrial fibrillation few days ago.  She denies chest pain, shortness of breath, palpitation, dizziness, orthopnea, PND, syncope, lower extremity edema or melena.  Patient is in the donut hole and Eliquis cost $100+/month.   Past Medical History:  Diagnosis Date  . Atrial fibrillation (HCC)   . Atrial flutter (HCC)   . Chest pain    with typical and atypical qualities  . Chronic low back pain   . Diabetes mellitus without complication (HCC)    Diet Controlled  . Dysrhythmia    ATRIAL FIBRILATION  . GERD (gastroesophageal reflux disease)   . Obesity     Past Surgical History:  Procedure Laterality Date  . BREAST EXCISIONAL BIOPSY Right 1990s   benign  . BREAST EXCISIONAL BIOPSY Left 1990s   benign  . COLONOSCOPY  03/01/2012   Procedure: COLONOSCOPY;  Surgeon: [name], MD;  Location: [facility] ENDOSCOPY;  Service: Endoscopy;  Laterality: N/A;  . ESOPHAGOGASTRODUODENOSCOPY  02/29/2012   Procedure: ESOPHAGOGASTRODUODENOSCOPY (EGD);  Surgeon: [name], MD;  Location: [facility] ENDOSCOPY;  Service: Endoscopy;  Laterality: N/A;  . FOOT SURGERY     x2  .  HYSTEROSCOPY W/D&C N/A 02/09/2017   Procedure: DILATATION AND CURETTAGE /HYSTEROSCOPY;  Surgeon: Gerald Leitz, MD;  Location: [facility] ORS;  Service: Gynecology;  Laterality: N/A;  w/Myosure for Polyp  . TONSILLECTOMY      Current Medications: Prior to Admission medications   Medication Sig Start Date End Date Taking? Authorizing Provider  acetaminophen (TYLENOL) 325 MG tablet Take 650 mg by mouth at bedtime as needed for moderate pain.     [provider]  Barberry-Oreg Grape-Goldenseal (BERBERINE COMPLEX) 200-200-50 MG CAPS Take by mouth 2 (two) times daily.    [provider]  Cholecalciferol (VITAMIN D) 2000 units CAPS Take 4,000 Units by mouth daily.    [provider]  ELIQUIS 5 MG TABS tablet TAKE 1 TABLET BY MOUTH 2  TIMES DAILY 11/14/18   Lyn Records, MD  flecainide (TAMBOCOR) 50 MG tablet Take 1 tablet (50 mg total) by mouth 2 (two) times daily. 12/19/18   Lyn Records, MD  furosemide (LASIX) 20 MG tablet Take 20-40 mg by mouth daily as needed for fluid or edema.  09/14/14   [provider]  ibuprofen (ADVIL,MOTRIN) 600 MG tablet 1 by mouth every 6 hours as needed 02/09/17   Gerald Leitz, MD  metoprolol (LOPRESSOR) 50 MG tablet TAKE 1 TABLET BY MOUTH TWICE A DAY, CAN TAKE AN EXTRA 25 MG (1/2 TABLET) AS NEEDED FOR AFIB EPISODES 02/29/16   Verdis Prime  W, MD  Multiple Vitamin (MULTIVITAMIN WITH MINERALS) TABS tablet Take 1 tablet by mouth daily.    [provider]  traMADol (ULTRAM) 50 MG tablet Take 50 mg by mouth at bedtime as needed for moderate pain.     [provider]    Allergies:   Bee venom, Hydrocodone-homatropine, Lipitor [atorvastatin], Proair hfa [albuterol], and Xarelto [rivaroxaban]   Social History   Socioeconomic History  . Marital status: Widowed    Spouse name: Not on file  . Number of children: 1  . Years of education: Not on file  . Highest education level: Not on file  Occupational History  . Not on file  Social  Needs  . Financial resource strain: Not on file  . Food insecurity    Worry: Not on file    Inability: Not on file  . Transportation needs    Medical: Not on file    Non-medical: Not on file  Tobacco Use  . Smoking status: Former Smoker    Packs/day: 0.50    Years: 20.00    Pack years: 10.00    Types: Cigarettes    Quit date: 07/20/2005    Years since quitting: 13.4  . Smokeless tobacco: Never Used  Substance and Sexual Activity  . Alcohol use: No  . Drug use: No  . Sexual activity: Not on file  Lifestyle  . Physical activity    Days per week: Not on file    Minutes per session: Not on file  . Stress: Not on file  Relationships  . Social Herbalist on phone: Not on file    Gets together: Not on file    Attends religious service: Not on file    Active member of club or organization: Not on file    Attends meetings of clubs or organizations: Not on file    Relationship status: Not on file  Other Topics Concern  . Not on file  Social History Narrative  . Not on file     Family History:  The patient's family history includes Breast cancer in her mother; COPD in her father; Heart attack in her brother and father; Heart disease in her sister; Lung cancer in her father.   ROS:   Please see the history of present illness.    ROS All other systems reviewed and are negative.   PHYSICAL EXAM:   VS:  BP 126/62   Pulse 67   Ht 5\' 10"  (1.778 m)   Wt 214 lb (97.1 kg)   SpO2 97%   BMI 30.71 kg/m    GEN: Well nourished, well developed, in no acute distress  HEENT: normal  Neck: no JVD, carotid bruits, or masses Cardiac:RRR; no murmurs, rubs, or gallops,no edema  Respiratory:  clear to auscultation bilaterally, normal work of breathing GI: soft, nontender, nondistended, + BS MS: no deformity or atrophy  Skin: warm and dry, no rash Neuro:  Alert and Oriented x 3, Strength and sensation are intact Psych: euthymic mood, full affect  Wt Readings from Last 3  Encounters:  12/26/18 214 lb (97.1 kg)  12/19/18 217 lb 6.4 oz (98.6 kg)  07/06/17 224 lb (101.6 kg)      Studies/Labs Reviewed:   EKG:  EKG is ordered today.  The ekg ordered today demonstrates sinus rhythm at rate of 67 bpm/PAC  Recent Labs: 12/19/2018: ALT 10; BUN 13; Creatinine, Ser 0.98; Hemoglobin 12.7; Platelets 307; Potassium 4.6; Sodium 138   Lipid Panel  Component Value Date/Time   CHOL 210 (H) 04/22/2013 2306   TRIG 200 (H) 04/22/2013 2306   HDL 32 (L) 04/22/2013 2306   CHOLHDL 6.6 04/22/2013 2306   VLDL 40 04/22/2013 2306   LDLCALC 138 (H) 04/22/2013 2306    Additional studies/ records that were reviewed today include:   As summarized above  ASSESSMENT & PLAN:    1. Paroxysmal atrial fibrillation Maintaining sinus rhythm on flecainide.  Had a brief episode of afib few days ago.  She is in donut hole and Eliquis cost greater than $100 for 1 a month.  We will try to give sample or coupons.  Pending echocardiogram and stress test. No bleeding issue.     Medication Adjustments/Labs and Tests Ordered: Current medicines are reviewed at length with the patient today.  Concerns regarding medicines are outlined above.  Medication changes, Labs and Tests ordered today are listed in the Patient Instructions below. Patient Instructions  Medication Instructions:  Your physician recommends that you continue on your current medications as directed. Please refer to the Current Medication list given to you today.  *If you need a refill on your cardiac medications before your next appointment, please call your pharmacy*  Lab Work: None    If you have labs (blood work) drawn today and your tests are completely normal, you will receive your results only by: Marland Kitchen. MyChart Message (if you have MyChart) OR . A paper copy in the mail If you have any lab test that is abnormal or we need to change your treatment, we will call you to review the results.  Testing/Procedures: None    Follow-Up: You are scheduled to see Dr. Katrinka BlazingSmith on 01/09/2019 @ 10:00 AM     Signed, Manson PasseyBhavinkumar , PA  12/26/2018 11:27 AM    [facility] [address], [place], [place]  [address] Phone: [phone]; Fax: [phone]

## 2018-12-26 ENCOUNTER — Encounter: Payer: Self-pay | Admitting: Physician Assistant

## 2018-12-26 ENCOUNTER — Ambulatory Visit: Payer: Medicare Other | Admitting: Physician Assistant

## 2018-12-26 ENCOUNTER — Telehealth (HOSPITAL_COMMUNITY): Payer: Self-pay

## 2018-12-26 ENCOUNTER — Other Ambulatory Visit: Payer: Self-pay

## 2018-12-26 VITALS — BP 126/62 | HR 67 | Ht 70.0 in | Wt 214.0 lb

## 2018-12-26 DIAGNOSIS — I480 Paroxysmal atrial fibrillation: Secondary | ICD-10-CM

## 2018-12-26 DIAGNOSIS — Z7901 Long term (current) use of anticoagulants: Secondary | ICD-10-CM

## 2018-12-26 DIAGNOSIS — I10 Essential (primary) hypertension: Secondary | ICD-10-CM | POA: Diagnosis not present

## 2018-12-26 NOTE — Telephone Encounter (Signed)
Attempted to speak with the patient, her answering machine said that she was unavailable and not expecting calls. Will try again later. S.Williams EMTP

## 2018-12-26 NOTE — Patient Instructions (Signed)
Medication Instructions:  Your physician recommends that you continue on your current medications as directed. Please refer to the Current Medication list given to you today.  *If you need a refill on your cardiac medications before your next appointment, please call your pharmacy*  Lab Work: None    If you have labs (blood work) drawn today and your tests are completely normal, you will receive your results only by: Marland Kitchen MyChart Message (if you have MyChart) OR . A paper copy in the mail If you have any lab test that is abnormal or we need to change your treatment, we will call you to review the results.  Testing/Procedures: None   Follow-Up: You are scheduled to see Dr. Tamala Julian on 01/09/2019 @ 10:00 AM

## 2018-12-27 ENCOUNTER — Telehealth (HOSPITAL_COMMUNITY): Payer: Self-pay

## 2018-12-27 ENCOUNTER — Encounter (HOSPITAL_COMMUNITY): Payer: Medicare Other

## 2018-12-27 NOTE — Telephone Encounter (Signed)
Patient given detailed instructions per Myocardial Perfusion Study Information Sheet for the test on 11/24 at 0715. Patient notified to arrive 15 minutes early and that it is imperative to arrive on time for appointment to keep from having the test rescheduled.  If you need to cancel or reschedule your appointment, please call the office within 24 hours of your appointment. . Patient verbalized understanding. TMY

## 2018-12-31 ENCOUNTER — Ambulatory Visit (HOSPITAL_BASED_OUTPATIENT_CLINIC_OR_DEPARTMENT_OTHER): Payer: Medicare Other

## 2018-12-31 ENCOUNTER — Ambulatory Visit (HOSPITAL_COMMUNITY): Payer: Medicare Other | Attending: Cardiology

## 2018-12-31 ENCOUNTER — Other Ambulatory Visit: Payer: Self-pay

## 2018-12-31 DIAGNOSIS — I480 Paroxysmal atrial fibrillation: Secondary | ICD-10-CM | POA: Insufficient documentation

## 2018-12-31 DIAGNOSIS — R0789 Other chest pain: Secondary | ICD-10-CM | POA: Diagnosis not present

## 2018-12-31 LAB — MYOCARDIAL PERFUSION IMAGING
LV dias vol: 76 mL (ref 46–106)
LV sys vol: 22 mL
Peak HR: 65 {beats}/min
Rest HR: 55 {beats}/min
SDS: 0
SRS: 0
SSS: 0
TID: 1.21

## 2018-12-31 MED ORDER — TECHNETIUM TC 99M TETROFOSMIN IV KIT
31.7000 | PACK | Freq: Once | INTRAVENOUS | Status: AC | PRN
  Administered 2018-12-31: 31.7 via INTRAVENOUS
  Filled 2018-12-31: qty 32

## 2018-12-31 MED ORDER — AMINOPHYLLINE 25 MG/ML IV SOLN
150.0000 mg | Freq: Once | INTRAVENOUS | Status: AC
  Administered 2018-12-31: 150 mg via INTRAVENOUS

## 2018-12-31 MED ORDER — REGADENOSON 0.4 MG/5ML IV SOLN
0.4000 mg | Freq: Once | INTRAVENOUS | Status: AC
  Administered 2018-12-31: 0.4 mg via INTRAVENOUS

## 2018-12-31 MED ORDER — TECHNETIUM TC 99M TETROFOSMIN IV KIT
10.7000 | PACK | Freq: Once | INTRAVENOUS | Status: AC | PRN
  Administered 2018-12-31: 10.7 via INTRAVENOUS
  Filled 2018-12-31: qty 11

## 2019-01-06 ENCOUNTER — Other Ambulatory Visit (HOSPITAL_COMMUNITY)
Admission: RE | Admit: 2019-01-06 | Discharge: 2019-01-06 | Disposition: A | Discharge status: Home / Self Care | Payer: Medicare Other | Source: Ambulatory Visit | Attending: Cardiology | Admitting: Cardiology

## 2019-01-06 DIAGNOSIS — Z01812 Encounter for preprocedural laboratory examination: Secondary | ICD-10-CM | POA: Diagnosis present

## 2019-01-06 DIAGNOSIS — Z20828 Contact with and (suspected) exposure to other viral communicable diseases: Secondary | ICD-10-CM | POA: Insufficient documentation

## 2019-01-06 LAB — SARS CORONAVIRUS 2 (TAT 6-24 HRS): SARS Coronavirus 2: NEGATIVE

## 2019-01-06 NOTE — Progress Notes (Signed)
Cardiology Office Note:    Date:  01/09/2019   ID:  Brittany Ford, DOB 03/09/1945, MRN [id number]  PCP:  Juluis RainierBarnes, Elizabeth, MD  Cardiologist:  Lesleigh NoeHenry W  III, MD   Referring MD: Juluis RainierBarnes, Elizabeth, MD   Chief Complaint  Patient presents with  . Atrial Fibrillation    History of Present Illness:    Brittany Ford is a 73 y.o. female with a hx of paroxysmal atrial fibrillation,  flecainide therapy for rhythm control started in October 2020, anticoagulation, and DM II.  She has no complaints.  She is frustrated by the cost of her medication.  She has not noticed blood in her urine or stool.  Past Medical History:  Diagnosis Date  . Atrial fibrillation (HCC)   . Atrial flutter (HCC)   . Chest pain    with typical and atypical qualities  . Chronic low back pain   . Diabetes mellitus without complication (HCC)    Diet Controlled  . Dysrhythmia    ATRIAL FIBRILATION  . GERD (gastroesophageal reflux disease)   . Obesity     Past Surgical History:  Procedure Laterality Date  . BREAST EXCISIONAL BIOPSY Right 1990s   benign  . BREAST EXCISIONAL BIOPSY Left 1990s   benign  . COLONOSCOPY  03/01/2012   Procedure: COLONOSCOPY;  Surgeon: Barrie FolkJohn C Hayes, MD;  [facility];  Service: Endoscopy;  Laterality: N/A;  . ESOPHAGOGASTRODUODENOSCOPY  02/29/2012   Procedure: ESOPHAGOGASTRODUODENOSCOPY (EGD);  Surgeon: Barrie FolkJohn C Hayes, MD;  [facility];  Service: Endoscopy;  Laterality: N/A;  . FOOT SURGERY     x2  . HYSTEROSCOPY W/D&C N/A 02/09/2017   Procedure: DILATATION AND CURETTAGE /HYSTEROSCOPY;  Surgeon: Gerald Leitzole, Tara, MD;  Location: [facility] ORS;  Service: Gynecology;  Laterality: N/A;  w/Myosure for Polyp  . TONSILLECTOMY      Current Medications: Current Meds  Medication Sig  . acetaminophen (TYLENOL) 325 MG tablet Take 650 mg by mouth at bedtime as needed for moderate pain.   Claris Gower. Barberry-Oreg Grape-Goldenseal (BERBERINE COMPLEX) 200-200-50 MG CAPS Take by  mouth 2 (two) times daily.  . Cholecalciferol (VITAMIN D) 2000 units CAPS Take 4,000 Units by mouth daily.  Marland Kitchen. ELIQUIS 5 MG TABS tablet TAKE 1 TABLET BY MOUTH 2  TIMES DAILY  . flecainide (TAMBOCOR) 50 MG tablet Take 1 tablet (50 mg total) by mouth 2 (two) times daily.  . furosemide (LASIX) 20 MG tablet Take 20-40 mg by mouth daily as needed for fluid or edema.   Marland Kitchen. ibuprofen (ADVIL,MOTRIN) 600 MG tablet 1 by mouth every 6 hours as needed  . metoprolol (LOPRESSOR) 50 MG tablet TAKE 1 TABLET BY MOUTH TWICE A DAY, CAN TAKE AN EXTRA 25 MG (1/2 TABLET) AS NEEDED FOR AFIB EPISODES  . Multiple Vitamin (MULTIVITAMIN WITH MINERALS) TABS tablet Take 1 tablet by mouth daily.  . traMADol (ULTRAM) 50 MG tablet Take 50 mg by mouth at bedtime as needed for moderate pain.      Allergies:   Bee venom, Hydrocodone-homatropine, Lipitor [atorvastatin], Proair hfa [albuterol], and Xarelto [rivaroxaban]   Social History   Socioeconomic History  . Marital status: Widowed    Spouse name: Not on file  . Number of children: 1  . Years of education: Not on file  . Highest education level: Not on file  Occupational History  . Not on file  Social Needs  . Financial resource strain: Not on file  . Food insecurity    Worry: Not on file  Inability: Not on file  . Transportation needs    Medical: Not on file    Non-medical: Not on file  Tobacco Use  . Smoking status: Former Smoker    Packs/day: 0.50    Years: 20.00    Pack years: 10.00    Types: Cigarettes    Quit date: 07/20/2005    Years since quitting: 13.4  . Smokeless tobacco: Never Used  Substance and Sexual Activity  . Alcohol use: No  . Drug use: No  . Sexual activity: Not on file  Lifestyle  . Physical activity    Days per week: Not on file    Minutes per session: Not on file  . Stress: Not on file  Relationships  . Social Musician on phone: Not on file    Gets together: Not on file    Attends religious service: Not on file     Active member of club or organization: Not on file    Attends meetings of clubs or organizations: Not on file    Relationship status: Not on file  Other Topics Concern  . Not on file  Social History Narrative  . Not on file     Family History: The patient's family history includes Breast cancer in her mother; COPD in her father; Heart attack in her brother and father; Heart disease in her sister; Lung cancer in her father.  ROS:   Please see the history of present illness.    No complaints. Sleep study today. All other systems reviewed and are negative.  EKGs/Labs/Other Studies Reviewed:    The following studies were reviewed today: Myocardial perfusion imaging 12/31/2018: Study Highlights   There was no ST segment deviation noted during stress.  Nuclear stress EF: 71%.  The left ventricular ejection fraction is hyperdynamic (>65%).  The study is normal.  This is a low risk study.       EKG:  EKG the EKG performed on the day of the myocardial perfusion study on 12/31/2018 revealed normal EKG with normal QRS duration and QT interval.  Recent Labs: 12/19/2018: ALT 10; BUN 13; Creatinine, Ser 0.98; Hemoglobin 12.7; Platelets 307; Potassium 4.6; Sodium 138  Recent Lipid Panel    Component Value Date/Time   CHOL 210 (H) 04/22/2013 2306   TRIG 200 (H) 04/22/2013 2306   HDL 32 (L) 04/22/2013 2306   CHOLHDL 6.6 04/22/2013 2306   VLDL 40 04/22/2013 2306   LDLCALC 138 (H) 04/22/2013 2306    Physical Exam:    VS:  BP 118/68   Pulse 62   Ht 5\' 11"  (1.803 m)   Wt 214 lb 12.8 oz (97.4 kg)   SpO2 96%   BMI 29.96 kg/m     Wt Readings from Last 3 Encounters:  01/09/19 214 lb 12.8 oz (97.4 kg)  01/08/19 213 lb (96.6 kg)  12/31/18 217 lb (98.4 kg)     GEN: Compatible with age. No acute distress HEENT: Normal NECK: No JVD. LYMPHATICS: No lymphadenopathy CARDIAC:  RRR without murmur, gallop, or edema. VASCULAR: Normal Pulses. No bruits. RESPIRATORY:  Clear to  auscultation without rales, wheezing or rhonchi  ABDOMEN: Soft, non-tender, non-distended, No pulsatile mass, MUSCULOSKELETAL: No deformity  SKIN: Warm and dry NEUROLOGIC:  Alert and oriented x 3 PSYCHIATRIC:  Normal affect   ASSESSMENT:    1. PAF (paroxysmal atrial fibrillation)/atrial flutter   2. Hypertension, unspecified type   3. Chronic anticoagulation   4. Other hyperlipidemia   5. Encounter for  monitoring flecainide therapy   6. Peripheral vascular disease ([place])   7. Essential hypertension   8. Non-rheumatic mitral regurgitation   9. Educated about COVID-19 virus infection    PLAN:    In order of problems listed above:  1. Tolerating flecainide without side effects.  Had nuclear study performed that is low risk for evidence of myocardial ischemia so assumption is that there is no critical coronary disease.  Significant improvement in burden of symptomatic atrial fibrillation.  Clinically and rhythm today. 2. Excellent blood pressure 3. Continue anticoagulation 4. No comment on lipids 5. Tolerating flecainide 50 mg twice daily.  Plan clinical follow-up in 6 months.  If she is stable at that point we will go to once per year follow-up.   Medication Adjustments/Labs and Tests Ordered: Current medicines are reviewed at length with the patient today.  Concerns regarding medicines are outlined above.  No orders of the defined types were placed in this encounter.  No orders of the defined types were placed in this encounter.   Patient Instructions  Medication Instructions:  Your physician recommends that you continue on your current medications as directed. Please refer to the Current Medication list given to you today.  *If you need a refill on your cardiac medications before your next appointment, please call your pharmacy*  Lab Work: None If you have labs (blood work) drawn today and your tests are completely normal, you will receive your results only by: Marland Kitchen MyChart  Message (if you have MyChart) OR . A paper copy in the mail If you have any lab test that is abnormal or we need to change your treatment, we will call you to review the results.  Testing/Procedures: None  Follow-Up: At [facility], you and your health needs are our priority.  As part of our continuing mission to provide you with exceptional heart care, we have created designated Provider Care Teams.  These Care Teams include your primary Cardiologist (physician) and Advanced Practice Providers (APPs -  Physician Assistants and Nurse Practitioners) who all work together to provide you with the care you need, when you need it.  Your next appointment:   6 month(s)  The format for your next appointment:   In Person  Provider:   You may see Sinclair Grooms, MD or one of the following Advanced Practice Providers on your designated Care Team:    Truitt Merle, NP  Cecilie Kicks, NP  Kathyrn Drown, NP   Other Instructions      Signed, Sinclair Grooms, MD  01/09/2019 10:32 AM    [place]

## 2019-01-07 ENCOUNTER — Telehealth: Payer: Self-pay

## 2019-01-07 NOTE — Telephone Encounter (Signed)
-----   Message from Sueanne Margarita, MD sent at 01/07/2019  1:20 PM EST ----- Please let patient know that labs were normal.  Continue current medical therapy.

## 2019-01-07 NOTE — Telephone Encounter (Signed)
Notes recorded by Frederik Schmidt, RN on 01/07/2019 at 2:15 PM EST  The patient has been notified of the result and verbalized understanding. All questions (if any) were answered.  Frederik Schmidt, RN 01/07/2019 2:15 PM   ------

## 2019-01-08 ENCOUNTER — Ambulatory Visit (HOSPITAL_BASED_OUTPATIENT_CLINIC_OR_DEPARTMENT_OTHER): Payer: Medicare Other | Attending: Interventional Cardiology | Admitting: Cardiology

## 2019-01-08 ENCOUNTER — Other Ambulatory Visit: Payer: Self-pay

## 2019-01-08 DIAGNOSIS — G4733 Obstructive sleep apnea (adult) (pediatric): Secondary | ICD-10-CM | POA: Insufficient documentation

## 2019-01-08 DIAGNOSIS — I480 Paroxysmal atrial fibrillation: Secondary | ICD-10-CM

## 2019-01-08 DIAGNOSIS — R0683 Snoring: Secondary | ICD-10-CM

## 2019-01-09 ENCOUNTER — Ambulatory Visit (INDEPENDENT_AMBULATORY_CARE_PROVIDER_SITE_OTHER): Payer: Medicare Other | Admitting: Interventional Cardiology

## 2019-01-09 ENCOUNTER — Encounter: Payer: Self-pay | Admitting: Interventional Cardiology

## 2019-01-09 VITALS — BP 118/68 | HR 62 | Ht 71.0 in | Wt 214.8 lb

## 2019-01-09 DIAGNOSIS — Z7189 Other specified counseling: Secondary | ICD-10-CM

## 2019-01-09 DIAGNOSIS — Z7901 Long term (current) use of anticoagulants: Secondary | ICD-10-CM | POA: Diagnosis not present

## 2019-01-09 DIAGNOSIS — Z79899 Other long term (current) drug therapy: Secondary | ICD-10-CM

## 2019-01-09 DIAGNOSIS — I10 Essential (primary) hypertension: Secondary | ICD-10-CM

## 2019-01-09 DIAGNOSIS — E7849 Other hyperlipidemia: Secondary | ICD-10-CM | POA: Diagnosis not present

## 2019-01-09 DIAGNOSIS — I480 Paroxysmal atrial fibrillation: Secondary | ICD-10-CM

## 2019-01-09 DIAGNOSIS — Z5181 Encounter for therapeutic drug level monitoring: Secondary | ICD-10-CM

## 2019-01-09 NOTE — Patient Instructions (Signed)

## 2019-01-09 NOTE — Procedures (Signed)
   Patient Name: Brittany Ford, Brittany Ford Date: 01/08/2019 Gender: Female D.O.B: [birth year] Age (years): 73 Referring Provider: Daneen Schick Height (inches): 71 Interpreting Physician: Fransico Him MD, ABSM Weight (lbs): 213 RPSGT: Carolin Coy BMI: 30 MRN: [id number] Neck Size: 13.50  CLINICAL INFORMATION Sleep Study Type: NPSG  Indication for sleep study: Diabetes, Fatigue, Hypertension, Snoring  Epworth Sleepiness Score: 3  SLEEP STUDY TECHNIQUE As per the AASM Manual for the Scoring of Sleep and Associated Events v2.3 (April 2016) with a hypopnea requiring 4% desaturations.  The channels recorded and monitored were frontal, central and occipital EEG, electrooculogram (EOG), submentalis EMG (chin), nasal and oral airflow, thoracic and abdominal wall motion, anterior tibialis EMG, snore microphone, electrocardiogram, and pulse oximetry.  MEDICATIONS Medications self-administered by patient taken the night of the study : N/A  SLEEP ARCHITECTURE The study was initiated at 10:54:06 PM and ended at 4:59:34 AM.  Sleep onset time was 122.8 minutes and the sleep efficiency was 65.0%. The total sleep time was 237.6 minutes.  Stage REM latency was 26.5 minutes.  The patient spent 7.0% of the night in stage N1 sleep, 78.1% in stage N2 sleep, 0.0% in stage N3 and 14.9% in REM.  Alpha intrusion was absent.  Supine sleep was 99.58%.  RESPIRATORY PARAMETERS The overall apnea/hypopnea index (AHI) was 9.6 per hour. There were 5 total apneas, including 1 obstructive, 2 central and 2 mixed apneas. There were 33 hypopneas and 24 RERAs.  The AHI during Stage REM sleep was 35.5 per hour.  AHI while supine was 9.6 per hour.  The mean oxygen saturation was 92.7%. The minimum SpO2 during sleep was 84.0%.  soft snoring was noted during this study.  CARDIAC DATA The 2 lead EKG demonstrated sinus rhythm. The mean heart rate was 58.2 beats per minute. Other EKG findings include: PACs.   LEG MOVEMENT DATA The total PLMS were 0 with a resulting PLMS index of 0.0. Associated arousal with leg movement index was 1.0 .  IMPRESSIONS - Mild obstructive sleep apnea occurred during this study (AHI = 9.6/h). - No significant central sleep apnea occurred during this study (CAI = 0.5/h). - Mild oxygen desaturation was noted during this study (Min O2 = 84.0%). - The patient snored with soft snoring volume. - PACs were noted during this study. - Clinically significant periodic limb movements did not occur during sleep. No significant associated arousals.  DIAGNOSIS - Obstructive Sleep Apnea (327.23 [G47.33 ICD-10])  RECOMMENDATIONS - Therapeutic CPAP titration to determine optimal pressure required to alleviate sleep disordered breathing. - Positional therapy avoiding supine position during sleep. - Avoid alcohol, sedatives and other CNS depressants that may worsen sleep apnea and disrupt normal sleep architecture. - Sleep hygiene should be reviewed to assess factors that may improve sleep quality. - Weight management and regular exercise should be initiated or continued if appropriate.

## 2019-01-10 ENCOUNTER — Telehealth: Payer: Self-pay | Admitting: *Deleted

## 2019-01-10 NOTE — Telephone Encounter (Signed)
-----   Message from Sueanne Margarita, MD sent at 01/09/2019 10:24 AM EST ----- Please let patient know that they have sleep apnea and recommend CPAP titration. Please set up titration in the sleep lab.

## 2019-01-10 NOTE — Telephone Encounter (Addendum)
Informed patient of sleep study results and patient understanding was verbalized. Patient understands her sleep study showed  they have sleep apnea and recommend CPAP titration. Please set up titration in the sleep lab.    titiration sent to sleep pool  Patient has declined her titration until next year. She will to let us know when she is ready.

## 2019-02-10 ENCOUNTER — Ambulatory Visit: Payer: Medicare Other | Admitting: Cardiology

## 2019-02-10 NOTE — Progress Notes (Deleted)
Cardiology Office Note   Date:  02/10/2019   ID:  Brittany Ford, DOB May 21, 1945, MRN [id number]  PCP:  Leighton Ruff, MD  Cardiologist:  Dr. Tamala Julian     No chief complaint on file.     History of Present Illness: Brittany Ford is a 74 y.o. female who presents for ***  a hx of paroxysmal atrial fibrillation, flecainide therapy for rhythm control started in October 2020, anticoagulation,and DM II. 12/31/2018 revealed normal EKG with normal QRS duration and QT interval  Past Medical History:  Diagnosis Date  . Atrial fibrillation ([place])   . Atrial flutter ([place])   . Chest pain    with typical and atypical qualities  . Chronic low back pain   . Diabetes mellitus without complication ([place])    Diet Controlled  . Dysrhythmia    ATRIAL FIBRILATION  . GERD (gastroesophageal reflux disease)   . Obesity     Past Surgical History:  Procedure Laterality Date  . BREAST EXCISIONAL BIOPSY Right 1990s   benign  . BREAST EXCISIONAL BIOPSY Left 1990s   benign  . COLONOSCOPY  03/01/2012   Procedure: COLONOSCOPY;  Surgeon: Missy Sabins, MD;  Location: [facility];  Service: Endoscopy;  Laterality: N/A;  . ESOPHAGOGASTRODUODENOSCOPY  02/29/2012   Procedure: ESOPHAGOGASTRODUODENOSCOPY (EGD);  Surgeon: Missy Sabins, MD;  Location: [facility] ENDOSCOPY;  Service: Endoscopy;  Laterality: N/A;  . FOOT SURGERY     x2  . HYSTEROSCOPY WITH D & C N/A 02/09/2017   Procedure: DILATATION AND CURETTAGE /HYSTEROSCOPY;  Surgeon: Christophe Louis, MD;  Location: [facility];  Service: Gynecology;  Laterality: N/A;  w/Myosure for Polyp  . TONSILLECTOMY       Current Outpatient Medications  Medication Sig Dispense Refill  . acetaminophen (TYLENOL) 325 MG tablet Take 650 mg by mouth at bedtime as needed for moderate pain.     Jolyne Loa Grape-Goldenseal (BERBERINE COMPLEX) 200-200-50 MG CAPS Take by mouth 2 (two) times daily.    . Cholecalciferol (VITAMIN D) 2000 units CAPS Take 4,000 Units by mouth  daily.    Marland Kitchen ELIQUIS 5 MG TABS tablet TAKE 1 TABLET BY MOUTH 2  TIMES DAILY 180 tablet 0  . flecainide (TAMBOCOR) 50 MG tablet Take 1 tablet (50 mg total) by mouth 2 (two) times daily. 180 tablet 3  . furosemide (LASIX) 20 MG tablet Take 20-40 mg by mouth daily as needed for fluid or edema.   0  . ibuprofen (ADVIL,MOTRIN) 600 MG tablet 1 by mouth every 6 hours as needed 20 tablet 0  . metoprolol (LOPRESSOR) 50 MG tablet TAKE 1 TABLET BY MOUTH TWICE A DAY, CAN TAKE AN EXTRA 25 MG (1/2 TABLET) AS NEEDED FOR AFIB EPISODES 225 tablet 1  . Multiple Vitamin (MULTIVITAMIN WITH MINERALS) TABS tablet Take 1 tablet by mouth daily.    . traMADol (ULTRAM) 50 MG tablet Take 50 mg by mouth at bedtime as needed for moderate pain.      No current facility-administered medications for this visit.    Allergies:   Bee venom, Hydrocodone-homatropine, Lipitor [atorvastatin], Proair hfa [albuterol], and Xarelto [rivaroxaban]    Social History:  The patient  reports that she quit smoking about 13 years ago. Her smoking use included cigarettes. She has a 10.00 pack-year smoking history. She has never used smokeless tobacco. She reports that she does not drink alcohol or use drugs.   Family History:  The patient's ***family history includes Breast cancer in her mother; COPD in her father; Heart  attack in her brother and father; Heart disease in her sister; Lung cancer in her father.    ROS:  General:no colds or fevers, no weight changes Skin:no rashes or ulcers HEENT:no blurred vision, no congestion CV:see HPI PUL:see HPI GI:no diarrhea constipation or melena, no indigestion GU:no hematuria, no dysuria MS:no joint pain, no claudication Neuro:no syncope, no lightheadedness Endo:no diabetes, no thyroid disease Wt Readings from Last 3 Encounters:  01/09/19 214 lb 12.8 oz (97.4 kg)  01/08/19 213 lb (96.6 kg)  12/31/18 217 lb (98.4 kg)     PHYSICAL EXAM: VS:  There were no vitals taken for this visit. , BMI  There is no height or weight on file to calculate BMI. General:Pleasant affect, NAD Skin:Warm and dry, brisk capillary refill HEENT:normocephalic, sclera clear, mucus membranes moist Neck:supple, no JVD, no bruits  Heart:S1S2 RRR without murmur, gallup, rub or click Lungs:clear without rales, rhonchi, or wheezes HTD:SKAJ, non tender, + BS, do not palpate liver spleen or masses Ext:no lower ext edema, 2+ pedal pulses, 2+ radial pulses Neuro:alert and oriented, MAE, follows commands, + facial symmetry    EKG:  EKG is ordered today. The ekg ordered today demonstrates ***   Recent Labs: 12/19/2018: ALT 10; BUN 13; Creatinine, Ser 0.98; Hemoglobin 12.7; Platelets 307; Potassium 4.6; Sodium 138    Lipid Panel    Component Value Date/Time   CHOL 210 (H) 04/22/2013 2306   TRIG 200 (H) 04/22/2013 2306   HDL 32 (L) 04/22/2013 2306   CHOLHDL 6.6 04/22/2013 2306   VLDL 40 04/22/2013 2306   LDLCALC 138 (H) 04/22/2013 2306       Other studies Reviewed: Additional studies/ records that were reviewed today include: ***.   ASSESSMENT AND PLAN:  1.  ***   Current medicines are reviewed with the patient today.  The patient Has no concerns regarding medicines.  The following changes have been made:  See above Labs/ tests ordered today include:see above  Disposition:   FU:  see above  Signed, Nada Boozer, NP  02/10/2019 1:27 PM    [facility] [address] [place], [place], [place]  [address]/ [address] [place], [place] Phone: [phone]; Fax: [phone]  [phone]

## 2019-02-20 ENCOUNTER — Other Ambulatory Visit: Payer: Self-pay | Admitting: *Deleted

## 2019-02-20 DIAGNOSIS — I480 Paroxysmal atrial fibrillation: Secondary | ICD-10-CM

## 2019-02-20 MED ORDER — APIXABAN 5 MG PO TABS: 5.0000 mg | ORAL_TABLET | Freq: Two times a day (BID) | ORAL | 1 refills | Status: DC

## 2019-02-20 NOTE — Telephone Encounter (Signed)
Prescription refill request for Eliquis received.  Last office visit: 12/3/2020[name] Scr: 0.98, 12/19/2018 Age: 74 y.o. Weight: 97.4 kg   Prescription refill sent.

## 2019-02-24 ENCOUNTER — Ambulatory Visit: Payer: Medicare Other | Admitting: Cardiovascular Disease

## 2019-03-03 ENCOUNTER — Other Ambulatory Visit: Payer: Self-pay

## 2019-03-03 DIAGNOSIS — I480 Paroxysmal atrial fibrillation: Secondary | ICD-10-CM

## 2019-03-03 MED ORDER — APIXABAN 5 MG PO TABS: 5.0000 mg | ORAL_TABLET | Freq: Two times a day (BID) | ORAL | 3 refills | Status: DC

## 2019-03-11 ENCOUNTER — Telehealth: Payer: Self-pay

## 2019-03-11 NOTE — Telephone Encounter (Signed)
   [facility] Pre-operative Risk Assessment    Request for surgical clearance:  1. What type of surgery is being performed? CERVICAL ESI STEROID INJECTION   2. When is this surgery scheduled? TBD   3. What type of clearance is required (medical clearance vs. Pharmacy clearance to hold med vs. Both)? PHARMACY  4. Are there any medications that need to be held prior to surgery and how long? ELIQUIS 2-3 DAYS PRIOR TO EPIDURAL   5. Practice name and name of physician performing surgery? [facility]; DR. Lake Bells IBAZEBO    6. What is your office phone number [phone]    7.   What is your office fax number [phone] ATT: X-RAY  8.   Anesthesia type (None, local, MAC, general) ? NONE LISTED   Brittany Ford 03/11/2019, 4:47 PM  _________________________________________________________________   (provider comments below)

## 2019-03-12 NOTE — Telephone Encounter (Signed)
   Primary Cardiologist: [name], MD  Chart reviewed as part of pre-operative protocol coverage. Patient recently seen by Dr. Katrinka Blazing on 01/09/2019 and was doing well from a cardiac standpoint at that time. I called and spoke with patient today. She reports no changes since recent visit. Given past medical history and time since last visit, based on ACC/AHA guidelines, Brittany Ford would be at acceptable risk for the planned procedure without further cardiovascular testing.   Per Pharmacy, "Recommend holding Eliquis for 3 days prior to spinal injection." Eliquis should be restarted as soon as able following procedure.   I will route this recommendation to the requesting party via Epic fax function and remove from pre-op pool.  Please call with questions.  Corrin Parker, PA-C 03/12/2019, 10:53 AM

## 2019-03-12 NOTE — Telephone Encounter (Signed)
Pt takes Eliquis for afib with CHADS2VASc score of 5 (age, sex, HTN, DM, PVD). Renal function is normal. Recommend holding Eliquis for 3 days prior to spinal injection.

## 2019-04-06 ENCOUNTER — Ambulatory Visit: Payer: Medicare Other | Attending: Internal Medicine

## 2019-04-06 DIAGNOSIS — Z23 Encounter for immunization: Secondary | ICD-10-CM | POA: Insufficient documentation

## 2019-04-06 NOTE — Progress Notes (Signed)
   Covid-19 Vaccination Clinic  Name:  Brittany Ford    MRN: [id number] DOB: November 02, 1945  04/06/2019  Brittany Ford was observed post Covid-19 immunization for 15 minutes without incidence. She was provided with Vaccine Information Sheet and instruction to access the V-Safe system.   Brittany Ford was instructed to call 911 with any severe reactions post vaccine: Marland Kitchen Difficulty breathing  . Swelling of your face and throat  . A fast heartbeat  . A bad rash all over your body  . Dizziness and weakness    Immunizations Administered    Name Date Dose VIS Date Route   Pfizer COVID-19 Vaccine 04/06/2019  3:00 PM 0.3 mL 01/17/2019 Intramuscular   Manufacturer: [employer], [employer]   Lot: [id number]   NDC: [address]-2

## 2019-04-07 ENCOUNTER — Telehealth: Payer: Self-pay | Admitting: Cardiology

## 2019-04-07 NOTE — Telephone Encounter (Signed)
Patient has an upcoming surgery on 04/28/19 for a pinched nerve in her neck. She states that there is some concern with sleep apnea kicking in after the surgery and she wants to make sure that she will have everything she needs to help with this. She states that she will need some new cpap supplies.

## 2019-05-06 ENCOUNTER — Other Ambulatory Visit: Payer: Self-pay | Admitting: Family Medicine

## 2019-05-06 ENCOUNTER — Ambulatory Visit: Payer: Medicare Other | Attending: Internal Medicine

## 2019-05-06 DIAGNOSIS — Z1231 Encounter for screening mammogram for malignant neoplasm of breast: Secondary | ICD-10-CM

## 2019-05-06 DIAGNOSIS — E2839 Other primary ovarian failure: Secondary | ICD-10-CM

## 2019-05-06 DIAGNOSIS — Z23 Encounter for immunization: Secondary | ICD-10-CM

## 2019-05-06 NOTE — Progress Notes (Signed)
2  Covid-19 Vaccination Clinic  Name:  Brittany Ford    MRN: [id number] DOB: April 30, 1945  05/06/2019  Brittany Ford was observed post Covid-19 immunization for 15 minutes without incident. She was provided with Vaccine Information Sheet and instruction to access the V-Safe system.   Brittany Ford was instructed to call 911 with any severe reactions post vaccine: Marland Kitchen Difficulty breathing  . Swelling of face and throat  . A fast heartbeat  . A bad rash all over body  . Dizziness and weakness   Immunizations Administered    Name Date Dose VIS Date Route   Pfizer COVID-19 Vaccine 05/06/2019 10:51 AM 0.3 mL 01/17/2019 Intramuscular   Manufacturer: [employer], [employer]   Lot: [id number]   NDC: [address]-3

## 2019-05-13 ENCOUNTER — Telehealth: Payer: Medicare Other | Admitting: Cardiology

## 2019-06-09 NOTE — Telephone Encounter (Signed)
Request for help with CPAP supplies forwarded to Veda Canning.

## 2019-06-10 NOTE — Telephone Encounter (Signed)
Reached out to the patient and she has informed me she is seeing Dr Yehuda Mao at [facility] sleep for her therapy.She does not see Dr Mayford Knife any longer.

## 2019-06-17 ENCOUNTER — Encounter: Payer: Self-pay | Admitting: Internal Medicine

## 2019-06-17 ENCOUNTER — Other Ambulatory Visit: Payer: Self-pay

## 2019-06-17 ENCOUNTER — Ambulatory Visit: Payer: Medicare Other | Admitting: Internal Medicine

## 2019-06-17 VITALS — BP 128/74 | HR 61 | Temp 98.2°F | Ht 70.0 in | Wt 202.4 lb

## 2019-06-17 DIAGNOSIS — R739 Hyperglycemia, unspecified: Secondary | ICD-10-CM

## 2019-06-17 DIAGNOSIS — E785 Hyperlipidemia, unspecified: Secondary | ICD-10-CM | POA: Insufficient documentation

## 2019-06-17 DIAGNOSIS — E1159 Type 2 diabetes mellitus with other circulatory complications: Secondary | ICD-10-CM | POA: Diagnosis not present

## 2019-06-17 LAB — GLUCOSE, POCT (MANUAL RESULT ENTRY): POC Glucose: 178 mg/dl — AB (ref 70–99)

## 2019-06-17 MED ORDER — GLIPIZIDE 5 MG PO TABS: 5.0000 mg | ORAL_TABLET | Freq: Every day | ORAL | 1 refills | Status: DC

## 2019-06-17 NOTE — Progress Notes (Signed)
Name: Brittany Ford  MRN/ DOB: [id number], 10/17/1945   Age/ Sex: 74 y.o., female    PCP: Leighton Ruff, MD   Reason for Endocrinology Evaluation: Type 2 Diabetes Mellitus     Date of Initial Endocrinology Visit: 06/17/2019     PATIENT IDENTIFIER: Brittany Ford is a 74 y.o. female with a past medical history of T2DM, PAD, Asthma , A.Fib and DJD. The patient presented for initial endocrinology clinic visit on 06/17/2019 for consultative assistance with her diabetes management.    HPI: Brittany Ford was    Diagnosed with DM in 2010 Prior Medications tried/Intolerance: Intolerant to higher doses of diabetes  Currently checking blood sugars 0 x / day Hypoglycemia episodes : Unknown              Hemoglobin A1c has ranged from 6.2% in 2014, peaking at 11.7% in 2021. Patient required assistance for hypoglycemia: no  Patient has required hospitalization within the last 1 year from hyper or hypoglycemia: no  In terms of diet, the patient eats 3 meals a day, snacks 2-3x a day   She is on the donut hole  HOME DIABETES REGIMEN: Metformin 500 mg , HALF a tablet BID      Statin: Intolerant to Atorvastatin and Rosuvastatin ACE-I/ARB: No Prior Diabetic Education: Yes  METER DOWNLOAD SUMMARY: Does not check     DIABETIC COMPLICATIONS: Microvascular complications:    Denies: retinopathy, neuropathy   Last eye exam: Completed 2019  Macrovascular complications:   PAD LE  Denies: CAD,  CVA   PAST HISTORY: Past Medical History:  Past Medical History:  Diagnosis Date  . Atrial fibrillation ([place])   . Atrial flutter ([place])   . Chest pain    with typical and atypical qualities  . Chronic low back pain   . Diabetes mellitus without complication ([place])    Diet Controlled  . Dysrhythmia    ATRIAL FIBRILATION  . GERD (gastroesophageal reflux disease)   . Obesity    Past Surgical History:  Past Surgical History:  Procedure Laterality Date  . BREAST  EXCISIONAL BIOPSY Right 1990s   benign  . BREAST EXCISIONAL BIOPSY Left 1990s   benign  . COLONOSCOPY  03/01/2012   Procedure: COLONOSCOPY;  Surgeon: Missy Sabins, MD;  Location: [facility];  Service: Endoscopy;  Laterality: N/A;  . ESOPHAGOGASTRODUODENOSCOPY  02/29/2012   Procedure: ESOPHAGOGASTRODUODENOSCOPY (EGD);  Surgeon: Missy Sabins, MD;  [facility];  Service: Endoscopy;  Laterality: N/A;  . FOOT SURGERY     x2  . HYSTEROSCOPY WITH D & C N/A 02/09/2017   Procedure: DILATATION AND CURETTAGE /HYSTEROSCOPY;  Surgeon: Christophe Louis, MD;  Location: [facility];  Service: Gynecology;  Laterality: N/A;  w/Myosure for Polyp  . TONSILLECTOMY        Social History:  reports that she quit smoking about 13 years ago. Her smoking use included cigarettes. She has a 10.00 pack-year smoking history. She has never used smokeless tobacco. She reports that she does not drink alcohol or use drugs. Family History:  Family History  Problem Relation Age of Onset  . COPD Father   . Heart attack Father   . Lung cancer Father   . Breast cancer Mother   . Heart attack Brother   . Heart disease Sister      HOME MEDICATIONS: Allergies as of 06/17/2019      Reactions   Bee Venom Anaphylaxis   Hydrocodone-homatropine Itching   Lipitor [atorvastatin]    Muscle aches  and cramping   Proair Hfa [albuterol]    headaches   Xarelto [rivaroxaban]    GI bleed      Medication List       Accurate as of Jun 17, 2019  9:51 AM. If you have any questions, ask your nurse or doctor.        Accu-Chek Aviva device by Other route. Use as instructed   acetaminophen 325 MG tablet Commonly known as: TYLENOL Take 650 mg by mouth at bedtime as needed for moderate pain.   apixaban 5 MG Tabs tablet Commonly known as: Eliquis Take 1 tablet (5 mg total) by mouth 2 (two) times daily.   Berberine Complex 200-200-50 MG Caps Generic drug: Barberry-Oreg Grape-Goldenseal Take by mouth 2 (two) times daily.    flecainide 50 MG tablet Commonly known as: TAMBOCOR Take 1 tablet (50 mg total) by mouth 2 (two) times daily.   furosemide 20 MG tablet Commonly known as: LASIX Take 20-40 mg by mouth daily as needed for fluid or edema.   ibuprofen 600 MG tablet Commonly known as: ADVIL 1 by mouth every 6 hours as needed   metFORMIN 500 MG tablet Commonly known as: GLUCOPHAGE Take 250 mg by mouth 2 (two) times daily.   metoprolol tartrate 50 MG tablet Commonly known as: LOPRESSOR TAKE 1 TABLET BY MOUTH TWICE A DAY, CAN TAKE AN EXTRA 25 MG (1/2 TABLET) AS NEEDED FOR AFIB EPISODES   multivitamin with minerals Tabs tablet Take 1 tablet by mouth daily.   traMADol 50 MG tablet Commonly known as: ULTRAM Take 50 mg by mouth at bedtime as needed for moderate pain.   Vitamin D 50 MCG (2000 UT) Caps Take 4,000 Units by mouth daily.        ALLERGIES: Allergies  Allergen Reactions  . Bee Venom Anaphylaxis  . Hydrocodone-Homatropine Itching  . Lipitor [Atorvastatin]     Muscle aches and cramping  . Proair Hfa [Albuterol]     headaches  . Xarelto [Rivaroxaban]     GI bleed     REVIEW OF SYSTEMS: A comprehensive ROS was conducted with the patient and is negative except as per HPI and below:  Review of Systems  Gastrointestinal: Negative for diarrhea and nausea.  Musculoskeletal: Positive for joint pain.  Neurological: Negative for tingling and tremors.      OBJECTIVE:   VITAL SIGNS: BP 128/74 (BP Location: Left Arm, Patient Position: Sitting, Cuff Size: Normal)   Pulse 61   Temp 98.2 F (36.8 C)   Ht 5\' 10"  (1.778 m)   Wt 202 lb 6.4 oz (91.8 kg)   SpO2 99%   BMI 29.04 kg/m    PHYSICAL EXAM:  General: Pt appears well and is in NAD  Neck: General: Supple without adenopathy or carotid bruits. Thyroid: Thyroid size normal.  No goiter or nodules appreciated. No thyroid bruit.  Lungs: Clear with good BS bilat with no rales, rhonchi, or wheezes  Heart: RRR with normal S1 and S2  and no gallops; no murmurs; no rub  Abdomen: Normoactive bowel sounds, soft, nontender, without masses or organomegaly palpable  Extremities:  Lower extremities - No pretibial edema. No lesions.  Skin: Normal texture and temperature to palpation.   Neuro: MS is good with appropriate affect, pt is alert and Ox3    DM foot exam: 06/17/2019  The skin of the feet is without sores or ulcerations. The pedal pulses are undetectable  The sensation is intact to a screening 5.07, 10 gram monofilament bilaterally  DATA REVIEWED:  Lab Results  Component Value Date   HGBA1C 7.4 (H) 04/22/2013   HGBA1C 6.2 (H) 02/29/2012   HGBA1C 6.7 (H) 01/14/2012           04/26/2019  A1c 11.7%  BUN/Cr 9/1.01 GFR 64  TSH 1.220 HDL 35 Tg 197 LDL 191 MA/Cr ratio 16    1/61/0960 A1c 11.0%  ASSESSMENT / PLAN / RECOMMENDATIONS:   1) Type 2 Diabetes Mellitus, Poorly controlled, Without complications - Most recent A1c of 11.7 %. Goal A1c < 7.0 %.    Plan: GENERAL: I have discussed with the patient the pathophysiology of diabetes. We went over the natural progression of the disease. We talked about both insulin resistance and insulin deficiency. We stressed the importance of lifestyle changes including diet and exercise. I explained the complications associated with diabetes including retinopathy, nephropathy, neuropathy as well as increased risk of cardiovascular disease. We went over the benefit seen with glycemic control.    I explained to the patient that diabetic patients are at higher than normal risk for amputations.   We discussed the importance of avoiding sugar sweetened beverages and avoiding snacks when possible. She believes artificial sweeteners are more dangerous for her.   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  Call [facility] if: BG persistently < 70  . I reviewed the Rule of 15 for the treatment of hypoglycemia in detail with the patient. Literature supplied.   2) Diabetic complications:   Eye: Does not have known diabetic retinopathy.   Neuro/ Feet: Does not have known diabetic peripheral neuropathy.  Renal: Patient does not have known baseline CKD. She is not on an ACEI/ARB at present.   3) Dyslipidemia: Patient is intolerant to Atorvastatin nor rosuvastatin. No interest in trying other options.   4) PVD:  - Pt tells me she has lower extremity vascular disease, she would like to know how she could improve, we discussed lipid lowering agents and walking exercise but she is unable to walk due to pains    F/U in 3 months    Signed electronically by: Lyndle Herrlich, MD  [facility] Endocrinology  [facility] [address] [place] [place], [place] [address] Phone: [phone] FAX: [phone]   CC: Juluis Rainier, MD [address] [place] [address] Phone: [phone]  Fax: [phone]    Return to Endocrinology clinic as below: Future Appointments  Date Time Provider Department Center  07/25/2019  9:30 AM [facility] MM 2 [facility]  07/25/2019 10:00 AM [facility] DX DEXA 1 [facility]

## 2019-06-17 NOTE — Patient Instructions (Signed)
-   Continue Metformin 500 mg , HALF a tablet  With Breakfast and supper  - Start Glipizide 5 mg, 1 tablet  before breakfast      HOW TO TREAT LOW BLOOD SUGARS (Blood sugar LESS THAN 70 MG/DL)  Please follow the RULE OF 15 for the treatment of hypoglycemia treatment (when your (blood sugars are less than 70 mg/dL)    STEP 1: Take 15 grams of carbohydrates when your blood sugar is low, which includes:   3-4 GLUCOSE TABS  OR  3-4 OZ OF JUICE OR REGULAR SODA OR  ONE TUBE OF GLUCOSE GEL     STEP 2: RECHECK blood sugar in 15 MINUTES STEP 3: If your blood sugar is still low at the 15 minute recheck --> then, go back to STEP 1 and treat AGAIN with another 15 grams of carbohydrates.

## 2019-06-18 ENCOUNTER — Telehealth: Payer: Self-pay | Admitting: Interventional Cardiology

## 2019-06-18 NOTE — Telephone Encounter (Signed)
  Pt c/o medication issue:  1. Name of Medication: zlitizide 5mg   2. How are you currently taking this medication (dosage and times per day)? Has not started  3. Are you having a reaction (difficulty breathing--STAT)? no  4. What is your medication issue? Patient states she has concerns about the medication and is unsure if she should take it. She has not started the medication and states she was prescribed it by her endocrinologist.

## 2019-06-18 NOTE — Telephone Encounter (Signed)
Spoke with pt and she states that she Endocrinologist prescribed Glipizide yesterday and she read the insert and it mentioned racing heart and coma.  Advised pt the coma is only if the blood sugar got very low.  Advised as far as heart racing is concerned, just to monitor and let Endo know if this occurs, but unlikely.  Pt was very concerned.  Reassured her that there are many heart pts that take this medication and do fine.  Pt agreeable to start Glipizide and was appreciative for call.

## 2019-06-25 ENCOUNTER — Telehealth: Payer: Self-pay | Admitting: Interventional Cardiology

## 2019-06-25 NOTE — Telephone Encounter (Signed)
Patient would like to speak to [place] about her Eliquis. She states it's very important, she has some papers that need to be filled out.

## 2019-06-25 NOTE — Telephone Encounter (Signed)
The patient is requesting to speak with Dr. [name] nurse. I let her know that Victorino Dike is out of the office right now. The patient states that she needs to get paperwork filled out in order to get her Eliquis at a lower cost. I let her know that Victorino Dike would call her back once she returns. She verbalized understanding.

## 2019-06-26 NOTE — Telephone Encounter (Signed)
**Note De-Identified  Obfuscation** No answer so I left a detailed message on the pts VM advising of [employer] Pt Asst program's phone number [phone]) and I recommended that she contact them to ask them questions concerning her eligibility, their document requirements and to ask that they mail her an application.  I advised in the message for her to complete her part of the application, obtain needed documents and to bring all to the office to drop off and we will take care of the provider part and fax to [employer] Pt Asst program.  I did leave my name and the office phone number so she can call back if she has questions for me.

## 2019-06-26 NOTE — Telephone Encounter (Signed)
Follow up  Pt said she missed a call from jennifer and to call her back as soon as she can

## 2019-06-27 NOTE — Telephone Encounter (Addendum)
**Note De-Identified  Obfuscation** The pt states that she has fallen into the "donut hole" and that she already has her [employer] pt asst application ready.  She states that she will bring it to [place] when she comes for her OV with Dr Katrinka Blazing on Monday 5/24 @ 1:20 so we can handle the provider part of the application and fax it to [employer].  She is concerned that she is going to run out of Eliquis before her [employer] Pt Asst application is approved or not. I have advised her to s/w Dr [name] nurse at her OV on Monday and that if we have Eliquis 5 mg samples we will provide her a bottle or 2.  She verbalized understanding and thanked me for calling her back.  I have completed the provider page of a [employer] Pt Asst application, scanned and emailed it to Dr [name] nurse so she can obtain Dr [name] signature, date it and fax to [employer] Pt Asst program.

## 2019-06-30 ENCOUNTER — Ambulatory Visit: Payer: Medicare Other | Admitting: Interventional Cardiology

## 2019-06-30 ENCOUNTER — Encounter: Payer: Self-pay | Admitting: Interventional Cardiology

## 2019-06-30 ENCOUNTER — Other Ambulatory Visit: Payer: Self-pay

## 2019-06-30 VITALS — BP 118/62 | HR 67 | Ht 70.0 in | Wt 203.4 lb

## 2019-06-30 DIAGNOSIS — I480 Paroxysmal atrial fibrillation: Secondary | ICD-10-CM | POA: Diagnosis not present

## 2019-06-30 DIAGNOSIS — E7849 Other hyperlipidemia: Secondary | ICD-10-CM

## 2019-06-30 DIAGNOSIS — Z7901 Long term (current) use of anticoagulants: Secondary | ICD-10-CM

## 2019-06-30 DIAGNOSIS — I10 Essential (primary) hypertension: Secondary | ICD-10-CM | POA: Diagnosis not present

## 2019-06-30 DIAGNOSIS — Z7189 Other specified counseling: Secondary | ICD-10-CM

## 2019-06-30 MED ORDER — METOPROLOL TARTRATE 25 MG PO TABS: 25.0000 mg | ORAL_TABLET | Freq: Two times a day (BID) | ORAL | 3 refills | Status: DC

## 2019-06-30 NOTE — Telephone Encounter (Signed)
Leaving 2 boxes of Eliquis 5 mg tablet with Victorino Dike, RN, for when pt comes into her appt and jennifer, RN can get pt's application.

## 2019-06-30 NOTE — Progress Notes (Signed)
Cardiology Office Note:    Date:  06/30/2019   ID:  Brittany Ford, DOB [birth year], MRN [id number]  PCP:  Leighton Ruff, MD  Cardiologist:  Sinclair Grooms, MD   Referring MD: Leighton Ruff, MD   Chief Complaint  Patient presents with  . Irregular Heart Beat    Palpitations    History of Present Illness:    Brittany Ford is a 74 y.o. female with a hx of a hx of paroxysmal atrial fibrillation, flecainide therapy for rhythm control started in October 2020, anticoagulation,and DM II.  She is having fatigue, still having difficulty with sleep apparatus, having a hard time taking care of herself, but has not noticed any significant palpitations or arrhythmias.  Past Medical History:  Diagnosis Date  . Atrial fibrillation ([place])   . Atrial flutter ([place])   . Chest pain    with typical and atypical qualities  . Chronic low back pain   . Diabetes mellitus without complication ([place])    Diet Controlled  . Dysrhythmia    ATRIAL FIBRILATION  . GERD (gastroesophageal reflux disease)   . Obesity     Past Surgical History:  Procedure Laterality Date  . BREAST EXCISIONAL BIOPSY Right 1990s   benign  . BREAST EXCISIONAL BIOPSY Left 1990s   benign  . COLONOSCOPY  03/01/2012   Procedure: COLONOSCOPY;  Surgeon: Missy Sabins, MD;  Location: [facility];  Service: Endoscopy;  Laterality: N/A;  . ESOPHAGOGASTRODUODENOSCOPY  02/29/2012   Procedure: ESOPHAGOGASTRODUODENOSCOPY (EGD);  Surgeon: Missy Sabins, MD;  Location: [facility] ENDOSCOPY;  Service: Endoscopy;  Laterality: N/A;  . FOOT SURGERY     x2  . HYSTEROSCOPY WITH D & C N/A 02/09/2017   Procedure: DILATATION AND CURETTAGE /HYSTEROSCOPY;  Surgeon: Christophe Louis, MD;  Location: [facility];  Service: Gynecology;  Laterality: N/A;  w/Myosure for Polyp  . TONSILLECTOMY      Current Medications: Current Meds  Medication Sig  . acetaminophen (TYLENOL) 325 MG tablet Take 650 mg by mouth at bedtime as needed for moderate pain.     Marland Kitchen apixaban (ELIQUIS) 5 MG TABS tablet Take 1 tablet (5 mg total) by mouth 2 (two) times daily.  Jolyne Loa Grape-Goldenseal (BERBERINE COMPLEX) 200-200-50 MG CAPS Take by mouth 2 (two) times daily.  . Blood Glucose Monitoring Suppl (ACCU-CHEK AVIVA) device by Other route. Use as instructed  . Cholecalciferol (VITAMIN D) 2000 units CAPS Take 4,000 Units by mouth daily.  . flecainide (TAMBOCOR) 50 MG tablet Take 1 tablet (50 mg total) by mouth 2 (two) times daily.  . furosemide (LASIX) 20 MG tablet Take 20-40 mg by mouth daily as needed for fluid or edema.   Marland Kitchen glipiZIDE (GLUCOTROL) 5 MG tablet Take 1 tablet (5 mg total) by mouth daily before breakfast.  . magnesium oxide (MAG-OX) 400 MG tablet Take 400 mg by mouth daily.  . melatonin 3 MG TABS tablet Take 3 mg by mouth at bedtime.  . metFORMIN (GLUCOPHAGE) 500 MG tablet Take 250 mg by mouth 2 (two) times daily.  . Multiple Vitamin (MULTIVITAMIN WITH MINERALS) TABS tablet Take 1 tablet by mouth daily.  . traMADol (ULTRAM) 50 MG tablet Take 50 mg by mouth at bedtime as needed for moderate pain.   . [DISCONTINUED] metoprolol (LOPRESSOR) 50 MG tablet TAKE 1 TABLET BY MOUTH TWICE A DAY, CAN TAKE AN EXTRA 25 MG (1/2 TABLET) AS NEEDED FOR AFIB EPISODES     Allergies:   Bee venom, Flu virus vaccine, Hydrocodone-homatropine, Lipitor [atorvastatin],  Proair hfa [albuterol], and Xarelto [rivaroxaban]   Social History   Socioeconomic History  . Marital status: Widowed    Spouse name: Not on file  . Number of children: 1  . Years of education: Not on file  . Highest education level: Not on file  Occupational History  . Not on file  Tobacco Use  . Smoking status: Former Smoker    Packs/day: 0.50    Years: 20.00    Pack years: 10.00    Types: Cigarettes    Quit date: 07/20/2005    Years since quitting: 13.9  . Smokeless tobacco: Never Used  Substance and Sexual Activity  . Alcohol use: No  . Drug use: No  . Sexual activity: Not on file   Other Topics Concern  . Not on file  Social History Narrative  . Not on file   Social Determinants of Health   Financial Resource Strain:   . Difficulty of Paying Living Expenses:   Food Insecurity:   . Worried About Programme researcher, broadcasting/film/video in the Last Year:   . Barista in the Last Year:   Transportation Needs:   . Freight forwarder (Medical):   Marland Kitchen Lack of Transportation (Non-Medical):   Physical Activity:   . Days of Exercise per Week:   . Minutes of Exercise per Session:   Stress:   . Feeling of Stress :   Social Connections:   . Frequency of Communication with Friends and Family:   . Frequency of Social Gatherings with Friends and Family:   . Attends Religious Services:   . Active Member of Clubs or Organizations:   . Attends Banker Meetings:   Marland Kitchen Marital Status:      Family History: The patient's family history includes Breast cancer in her mother; COPD in her father; Heart attack in her brother and father; Heart disease in her sister; Lung cancer in her father.  ROS:   Please see the history of present illness.    She wants to be off of some of her medications.  Diabetes is significantly out of control with a hemoglobin A1c greater than 11.  All other systems reviewed and are negative.  EKGs/Labs/Other Studies Reviewed:    The following studies were reviewed today: No new data  EKG:  EKG not performed  Recent Labs: 12/19/2018: ALT 10; BUN 13; Creatinine, Ser 0.98; Hemoglobin 12.7; Platelets 307; Potassium 4.6; Sodium 138  Recent Lipid Panel    Component Value Date/Time   CHOL 210 (H) 04/22/2013 2306   TRIG 200 (H) 04/22/2013 2306   HDL 32 (L) 04/22/2013 2306   CHOLHDL 6.6 04/22/2013 2306   VLDL 40 04/22/2013 2306   LDLCALC 138 (H) 04/22/2013 2306    Physical Exam:    VS:  BP 118/62   Pulse 67   Ht 5\' 10"  (1.778 m)   Wt 203 lb 6.4 oz (92.3 kg)   SpO2 97%   BMI 29.18 kg/m     Wt Readings from Last 3 Encounters:  06/30/19 203  lb 6.4 oz (92.3 kg)  06/17/19 202 lb 6.4 oz (91.8 kg)  01/09/19 214 lb 12.8 oz (97.4 kg)     GEN: Slender somewhat pale in appearance. No acute distress HEENT: Normal NECK: No JVD. LYMPHATICS: No lymphadenopathy CARDIAC: 2/6 systolic right upper sternal murmur RRR without diastolic murmur, gallop, or edema. VASCULAR:  Normal Pulses. No bruits. RESPIRATORY:  Clear to auscultation without rales, wheezing or rhonchi  ABDOMEN: Soft,  non-tender, non-distended, No pulsatile mass, MUSCULOSKELETAL: No deformity  SKIN: Warm and dry NEUROLOGIC:  Alert and oriented x 3 PSYCHIATRIC:  Normal affect   ASSESSMENT:    1. PAF (paroxysmal atrial fibrillation)/atrial flutter   2. Hypertension, unspecified type   3. Chronic anticoagulation   4. Other hyperlipidemia   5. Educated about COVID-19 virus infection    PLAN:    In order of problems listed above:  1. Suppression with flecainide has symptomatically improved her quality of life.  She has had no prolonged episodes. 2. Blood pressure is under excellent control 3. Eliquis is expensive and she may not be able to continue.  We will try to get assistance. 4. LDL needs to be less than 70 given diabetes.  I pointed this out to her and will encourage management per primary care. 5. COVID-19 vaccine has been received.   Medication Adjustments/Labs and Tests Ordered: Current medicines are reviewed at length with the patient today.  Concerns regarding medicines are outlined above.  No orders of the defined types were placed in this encounter.  Meds ordered this encounter  Medications  . metoprolol tartrate (LOPRESSOR) 25 MG tablet    Sig: Take 1 tablet (25 mg total) by mouth 2 (two) times daily.    Dispense:  180 tablet    Refill:  3    Dose change    Patient Instructions  Medication Instructions:  1) DECREASE Metoprolol Tartrate to 25mg  twice daily.  *If you need a refill on your cardiac medications before your next appointment, please  call your pharmacy*   Lab Work: None If you have labs (blood work) drawn today and your tests are completely normal, you will receive your results only by: MyChart Message (if you have MyChart) OR . A paper copy in the mail If you have any lab test that is abnormal or we need to change your treatment, we will call you to review the results.   Testing/Procedures: None   Follow-Up: At [facility], you and your health needs are our priority.  As part of our continuing mission to provide you with exceptional heart care, we have created designated Provider Care Teams.  These Care Teams include your primary Cardiologist (physician) and Advanced Practice Providers (APPs -  Physician Assistants and Nurse Practitioners) who all work together to provide you with the care you need, when you need it.  We recommend signing up for the patient portal called "MyChart".  Sign up information is provided on this After Visit Summary.  MyChart is used to connect with patients for Virtual Visits (Telemedicine).  Patients are able to view lab/test results, encounter notes, upcoming appointments, etc.  Non-urgent messages can be sent to your provider as well.   To learn more about what you can do with MyChart, go to [facility].    Your next appointment:   12 month(s)  The format for your next appointment:   In Person  Provider:   You may see [email or web address], MD or one of the following Advanced Practice Providers on your designated Care Team:    [name], NP  Norma Fredrickson, NP  Nada Boozer, NP    Other Instructions      Signed, Georgie Chard, MD  06/30/2019 1:49 PM    [facility]

## 2019-06-30 NOTE — Telephone Encounter (Signed)
Pt dropped off her portion of the application.  Had Dr. Katrinka Blazing to sign his portion.  Paperwork given to [place], [place] to send to [place], [employer].

## 2019-06-30 NOTE — Patient Instructions (Signed)
Medication Instructions:  1) DECREASE Metoprolol Tartrate to 25mg  twice daily.  *If you need a refill on your cardiac medications before your next appointment, please call your pharmacy*   Lab Work: None If you have labs (blood work) drawn today and your tests are completely normal, you will receive your results only by: MyChart Message (if you have MyChart) OR . A paper copy in the mail If you have any lab test that is abnormal or we need to change your treatment, we will call you to review the results.   Testing/Procedures: None   Follow-Up: At [facility], you and your health needs are our priority.  As part of our continuing mission to provide you with exceptional heart care, we have created designated Provider Care Teams.  These Care Teams include your primary Cardiologist (physician) and Advanced Practice Providers (APPs -  Physician Assistants and Nurse Practitioners) who all work together to provide you with the care you need, when you need it.  We recommend signing up for the patient portal called "MyChart".  Sign up information is provided on this After Visit Summary.  MyChart is used to connect with patients for Virtual Visits (Telemedicine).  Patients are able to view lab/test results, encounter notes, upcoming appointments, etc.  Non-urgent messages can be sent to your provider as well.   To learn more about what you can do with MyChart, go to [facility].    Your next appointment:   12 month(s)  The format for your next appointment:   In Person  Provider:   You may see [email or web address], MD or one of the following Advanced Practice Providers on your designated Care Team:    [name], NP  Norma Fredrickson, NP  Nada Boozer, NP    Other Instructions

## 2019-07-02 NOTE — Telephone Encounter (Signed)
Application faxed yesterday

## 2019-07-02 NOTE — Telephone Encounter (Signed)
I have completed the MD page of the pts [employer] Pt Asst application and emailed all to Dr [name] nurse to fax all to [employer] at fax number written on cover letter included.

## 2019-07-11 ENCOUNTER — Telehealth: Payer: Self-pay | Admitting: Internal Medicine

## 2019-07-11 MED ORDER — GLIPIZIDE 5 MG PO TABS
2.5000 mg | ORAL_TABLET | Freq: Every day | ORAL | 1 refills | Status: DC
Start: 2019-07-11 — End: 2019-09-26

## 2019-07-11 NOTE — Telephone Encounter (Signed)
Called patient and she stated that her blood glucose numbers have been getting a little low at times. Highest May 26th--135 May 30th--95 Lowest May 31st--71 June 1st--94 June 2nd--82 Today is 17  Patient takes readings first thing in the morning. Patient did not have any symptoms when BG was 71 and just concerned about this number being lower than normal.  Please advise.

## 2019-07-11 NOTE — Telephone Encounter (Signed)
Called patient and LMOVM to return call  I left a detailed voice message on patient voice mail of message from Dr. Lonzo Cloud: Please ask the Brittany Ford to reduce Glipizide to HALF a tablet before breakfast but continue Metformin same dose   Dosage has been changed on medication.

## 2019-07-11 NOTE — Telephone Encounter (Signed)
Patient has said her sugars have been low and she's not sure if it is due to starting the glipizide. Please advise. Ph# [phone]

## 2019-07-25 ENCOUNTER — Other Ambulatory Visit: Payer: Medicare Other

## 2019-07-25 ENCOUNTER — Other Ambulatory Visit: Payer: Self-pay

## 2019-07-25 ENCOUNTER — Ambulatory Visit
Admission: RE | Admit: 2019-07-25 | Discharge: 2019-07-25 | Disposition: A | Discharge status: Home / Self Care | Payer: Medicare Other | Source: Ambulatory Visit | Attending: Family Medicine | Admitting: Family Medicine

## 2019-07-25 DIAGNOSIS — Z1231 Encounter for screening mammogram for malignant neoplasm of breast: Secondary | ICD-10-CM

## 2019-08-28 ENCOUNTER — Ambulatory Visit: Payer: Medicare Other | Admitting: Interventional Cardiology

## 2019-09-24 ENCOUNTER — Telehealth: Payer: Self-pay | Admitting: Interventional Cardiology

## 2019-09-24 MED ORDER — FLECAINIDE ACETATE 50 MG PO TABS: 50.0000 mg | ORAL_TABLET | Freq: Two times a day (BID) | ORAL | 2 refills | Status: DC

## 2019-09-24 NOTE — Telephone Encounter (Signed)
Pt's medication was sent to pt's pharmacy as requested. Confirmation received.  °

## 2019-09-24 NOTE — Telephone Encounter (Signed)
*  STAT* If patient is at the pharmacy, call can be transferred to refill team.   1. Which medications need to be refilled? (please list name of each medication and dose if known) flecainide (TAMBOCOR) 50 MG tablet  2. Which pharmacy/location (including street and city if local pharmacy) is medication to be sent to? [id number] - [place], [place] - [address]  3. Do they need a 30 day or 90 day supply? 90

## 2019-09-25 ENCOUNTER — Ambulatory Visit: Payer: Medicare Other | Admitting: Internal Medicine

## 2019-09-25 NOTE — Progress Notes (Deleted)
Name: Brittany Ford  Age/ Sex: 74 y.o., female   MRN/ DOB: [id number], 1945-04-22     PCP: Juluis Rainier, MD   Reason for Endocrinology Evaluation: Type 2 Diabetes Mellitus  Initial Endocrine Consultative Visit: 06/17/2019    PATIENT IDENTIFIER: Brittany Ford is a 74 y.o. female with a past medical history of T2DM, PAD, Asthma , A.Fib and DJD. The patient has followed with Endocrinology clinic since 06/17/2019 for consultative assistance with management of her diabetes.  DIABETIC HISTORY:  Brittany Ford was diagnosed with DM in 2010. Intolerant to higher doses of metformin. Her hemoglobin A1c has ranged from 6.2% in 2014, peaking at 11.7% in 2021.   SUBJECTIVE:   During the last visit (06/17/2019): A1c   Today (09/25/2019): [name]  She checks her blood sugars *** times daily, preprandial to breakfast and ***. The patient has *** had hypoglycemic episodes since the last clinic visit, which typically occur *** x / - most often occuring ***. The patient is *** symptomatic with these episodes, with symptoms of {symptoms; hypoglycemia:9084048}.     HOME DIABETES REGIMEN:  Metformin 500 mg, half a tablet BID Glipizide 5 mg, Half a tablet before Breakfast   Statin: Intolerant to Atorvastatin and rosuvastatin ACE-I/ARB: no    METER DOWNLOAD SUMMARY: Date range evaluated: *** Fingerstick Blood Glucose Tests = *** Average Number Tests/Day = *** Overall Mean FS Glucose = *** Standard Deviation = ***  BG Ranges: Low = *** High = ***   Hypoglycemic Events/30 Days: BG < 50 = *** Episodes of symptomatic severe hypoglycemia = ***    DIABETIC COMPLICATIONS: Microvascular complications:    Denies: retinopathy, neuropathy   Last eye exam: Completed 2019  Macrovascular complications:   PAD LE  Denies: CAD,  CVA   HISTORY:  Past Medical History:  Past Medical History:  Diagnosis Date  . Atrial fibrillation ([facility])   . Atrial flutter  ([facility])   . Chest pain    with typical and atypical qualities  . Chronic low back pain   . Diabetes mellitus without complication ([facility])    Diet Controlled  . Dysrhythmia    ATRIAL FIBRILATION  . GERD (gastroesophageal reflux disease)   . Obesity     Past Surgical History:  Past Surgical History:  Procedure Laterality Date  . BREAST EXCISIONAL BIOPSY Right 1990s   benign  . BREAST EXCISIONAL BIOPSY Left 1990s   benign  . COLONOSCOPY  03/01/2012   Procedure: COLONOSCOPY;  Surgeon: [name], MD;  Location: [facility] ENDOSCOPY;  Service: Endoscopy;  Laterality: N/A;  . ESOPHAGOGASTRODUODENOSCOPY  02/29/2012   Procedure: ESOPHAGOGASTRODUODENOSCOPY (EGD);  Surgeon: [name], MD;  [facility];  Service: Endoscopy;  Laterality: N/A;  . FOOT SURGERY     x2  . HYSTEROSCOPY WITH D & C N/A 02/09/2017   Procedure: DILATATION AND CURETTAGE /HYSTEROSCOPY;  Surgeon: Gerald Leitz, MD;  Location: [facility];  Service: Gynecology;  Laterality: N/A;  w/Myosure for Polyp  . TONSILLECTOMY       Social History:  reports that she quit smoking about 14 years ago. Her smoking use included cigarettes. She has a 10.00 pack-year smoking history. She has never used smokeless tobacco. She reports that she does not drink alcohol and does not use drugs. Family History:  Family History  Problem Relation Age of Onset  . COPD Father   . Heart attack Father   . Lung cancer Father   . Breast cancer Mother   . Heart attack Brother   .  Heart disease Sister       HOME MEDICATIONS: Allergies as of 09/25/2019      Reactions   Bee Venom Anaphylaxis   Flu Virus Vaccine Shortness Of Breath   Hydrocodone-homatropine Itching   Lipitor [atorvastatin]    Muscle aches and cramping   Proair Hfa [albuterol]    headaches   Xarelto [rivaroxaban]    GI bleed      Medication List       Accurate as of September 25, 2019  7:26 AM. If you have any questions, ask your nurse or doctor.        Accu-Chek Aviva  device by Other route. Use as instructed   acetaminophen 325 MG tablet Commonly known as: TYLENOL Take 650 mg by mouth at bedtime as needed for moderate pain.   apixaban 5 MG Tabs tablet Commonly known as: Eliquis Take 1 tablet (5 mg total) by mouth 2 (two) times daily.   Berberine Complex 200-200-50 MG Caps Generic drug: Barberry-Oreg Grape-Goldenseal Take by mouth 2 (two) times daily.   flecainide 50 MG tablet Commonly known as: TAMBOCOR Take 1 tablet (50 mg total) by mouth 2 (two) times daily.   furosemide 20 MG tablet Commonly known as: LASIX Take 20-40 mg by mouth daily as needed for fluid or edema.   glipiZIDE 5 MG tablet Commonly known as: GLUCOTROL Take 0.5 tablets (2.5 mg total) by mouth daily before breakfast.   magnesium oxide 400 MG tablet Commonly known as: MAG-OX Take 400 mg by mouth daily.   melatonin 3 MG Tabs tablet Take 3 mg by mouth at bedtime.   metFORMIN 500 MG tablet Commonly known as: GLUCOPHAGE Take 250 mg by mouth 2 (two) times daily.   metoprolol tartrate 25 MG tablet Commonly known as: LOPRESSOR Take 1 tablet (25 mg total) by mouth 2 (two) times daily.   multivitamin with minerals Tabs tablet Take 1 tablet by mouth daily.   traMADol 50 MG tablet Commonly known as: ULTRAM Take 50 mg by mouth at bedtime as needed for moderate pain.   Vitamin D 50 MCG (2000 UT) Caps Take 4,000 Units by mouth daily.        OBJECTIVE:   Vital Signs: There were no vitals taken for this visit.  Wt Readings from Last 3 Encounters:  06/30/19 203 lb 6.4 oz (92.3 kg)  06/17/19 202 lb 6.4 oz (91.8 kg)  01/09/19 214 lb 12.8 oz (97.4 kg)     Exam: General: Pt appears well and is in NAD  Lungs: Clear with good BS bilat with no rales, rhonchi, or wheezes  Heart: RRR with normal S1 and S2 and no gallops; no murmurs; no rub  Abdomen: Normoactive bowel sounds, soft, nontender, without masses or organomegaly palpable  Extremities: No pretibial edema.   Neuro: MS is good with appropriate affect, pt is alert and Ox3      DM foot exam: 06/17/2019  The skin of the feet is without sores or ulcerations. The pedal pulses are undetectable  The sensation is intact to a screening 5.07, 10 gram monofilament bilaterally       DATA REVIEWED:  Lab Results  Component Value Date   HGBA1C 7.4 (H) 04/22/2013   HGBA1C 6.2 (H) 02/29/2012   HGBA1C 6.7 (H) 01/14/2012   Lab Results  Component Value Date   LDLCALC 138 (H) 04/22/2013   CREATININE 0.98 12/19/2018   No results found for: MICRALBCREAT   Lab Results  Component Value Date   CHOL 210 (H)  04/22/2013   HDL 32 (L) 04/22/2013   LDLCALC 138 (H) 04/22/2013   TRIG 200 (H) 04/22/2013   CHOLHDL 6.6 04/22/2013       04/26/2019  A1c 11.7%  BUN/Cr 9/1.01 GFR 64  TSH 1.220 HDL 35 Tg 197 LDL 191 MA/Cr ratio 16    2/35/3614 A1c 11.0%  ASSESSMENT / PLAN / RECOMMENDATIONS:   1) Type 2 Diabetes Mellitus, Poorly controlled, Without complications - Most recent A1c of *** %. Goal A1c < 7.0 %.    Plan: MEDICATIONS:  ***  EDUCATION / INSTRUCTIONS:  BG monitoring instructions: Patient is instructed to check her blood sugars *** times a day, ***.  Call [facility] if: BG persistently < 70 or > 300. . I reviewed the Rule of 15 for the treatment of hypoglycemia in detail with the patient. Literature supplied.  2) Diabetic complications:   Eye: Does *** have known diabetic retinopathy.   Neuro/ Feet: Does *** have known diabetic peripheral neuropathy .   Renal: Patient does *** have known baseline CKD. She   is *** on an ACEI/ARB at present. Check urine albumin/creatinine ratio yearly starting at time of diagnosis. If albuminuria is positive, treatment is geared toward better glucose, blood pressure control and use of ACE inhibitors or ARBs. Monitor electrolytes and creatinine once to twice yearly.    3) Dyslipidemia: Patient is intolerant to Atorvastatin  nor rosuvastatin. She has no interest in trying other options.   F/U in ***    Signed electronically by: Lyndle Herrlich, MD  [facility]  [facility] [address] [place], [place] [address] Phone: [phone] FAX: [phone]   CC: Juluis Rainier, MD [address] [place] [address] Phone: [phone]  Fax: [phone]  Return to Endocrinology clinic as below: Future Appointments  Date Time Provider Department Center  09/25/2019  9:50 AM , Konrad Dolores, MD [id number] None

## 2019-09-26 ENCOUNTER — Ambulatory Visit: Payer: Medicare Other | Admitting: Internal Medicine

## 2019-09-26 ENCOUNTER — Encounter: Payer: Self-pay | Admitting: Internal Medicine

## 2019-09-26 ENCOUNTER — Other Ambulatory Visit: Payer: Self-pay

## 2019-09-26 VITALS — BP 126/80 | HR 63 | Resp 18 | Ht 70.0 in | Wt 199.4 lb

## 2019-09-26 DIAGNOSIS — E1159 Type 2 diabetes mellitus with other circulatory complications: Secondary | ICD-10-CM | POA: Diagnosis not present

## 2019-09-26 LAB — POCT GLUCOSE (DEVICE FOR HOME USE): Glucose Fasting, POC: 162 mg/dL — AB (ref 70–99)

## 2019-09-26 LAB — POCT GLYCOSYLATED HEMOGLOBIN (HGB A1C): Hemoglobin A1C: 6 % — AB (ref 4.0–5.6)

## 2019-09-26 MED ORDER — GLIPIZIDE 5 MG PO TABS: 2.5000 mg | ORAL_TABLET | Freq: Every day | ORAL | 3 refills | Status: DC

## 2019-09-26 NOTE — Progress Notes (Signed)
Name: Brittany Ford  Age/ Sex: 74 y.o., female   MRN/ DOB: [id number], 1945-05-25     PCP: Juluis Rainier, MD   Reason for Endocrinology Evaluation: Type 2 Diabetes Mellitus  Initial Endocrine Consultative Visit: 06/17/2019    PATIENT IDENTIFIER: Brittany Ford is a 75 y.o. female with a past medical history of T2DM, PAD, Asthma , A.Fib and DJD. The patient has followed with Endocrinology clinic since 06/17/2019 for consultative assistance with management of her diabetes.  DIABETIC HISTORY:  Brittany Ford was diagnosed with DM in 2010. Intolerant to higher doses of metformin. Her hemoglobin A1c has ranged from 6.2% in 2014, peaking at 11.7% in 2021.  On her initial visit to our clinic she had an A1c of 11.7% . She was on Metformin only. We added Glipizide  SUBJECTIVE:   During the last visit (06/17/2019): A1c 11.7 %. Continue Metformin and added Glipizide   Today (09/26/2019): Brittany Ford is here for a follow up on diabetes.  She checks her blood sugars 1-2 times daily, preprandial to breakfast. The patient has not had hypoglycemic episodes since the last clinic visit.   HOME DIABETES REGIMEN:  Metformin 500 mg, half a tablet BID Glipizide 5 mg, Half a tablet before Breakfast - taking 1 tablet daily      Statin: Intolerant to Atorvastatin and rosuvastatin ACE-I/ARB: no    METER DOWNLOAD SUMMARY: Did not bring    DIABETIC COMPLICATIONS: Microvascular complications:    Denies: retinopathy, neuropathy   Last eye exam: Completed 2019- has an appointment 10/06/2019  Macrovascular complications:   PAD LE  Denies: CAD,  CVA   HISTORY:  Past Medical History:  Past Medical History:  Diagnosis Date  . Atrial fibrillation (HCC)   . Atrial flutter (HCC)   . Chest pain    with typical and atypical qualities  . Chronic low back pain   . Diabetes mellitus without complication (HCC)    Diet Controlled  . Dysrhythmia    ATRIAL FIBRILATION   . GERD (gastroesophageal reflux disease)   . Obesity    Past Surgical History:  Past Surgical History:  Procedure Laterality Date  . BREAST EXCISIONAL BIOPSY Right 1990s   benign  . BREAST EXCISIONAL BIOPSY Left 1990s   benign  . COLONOSCOPY  03/01/2012   Procedure: COLONOSCOPY;  Surgeon: [name], MD;  Location: [facility] ENDOSCOPY;  Service: Endoscopy;  Laterality: N/A;  . ESOPHAGOGASTRODUODENOSCOPY  02/29/2012   Procedure: ESOPHAGOGASTRODUODENOSCOPY (EGD);  Surgeon: [name], MD;  Location: [facility] ENDOSCOPY;  Service: Endoscopy;  Laterality: N/A;  . FOOT SURGERY     x2  . HYSTEROSCOPY WITH D & C N/A 02/09/2017   Procedure: DILATATION AND CURETTAGE /HYSTEROSCOPY;  Surgeon: Gerald Leitz, MD;  Location: [facility] ORS;  Service: Gynecology;  Laterality: N/A;  w/Myosure for Polyp  . TONSILLECTOMY      Social History:  reports that she quit smoking about 14 years ago. Her smoking use included cigarettes. She has a 10.00 pack-year smoking history. She has never used smokeless tobacco. She reports that she does not drink alcohol and does not use drugs. Family History:  Family History  Problem Relation Age of Onset  . COPD Father   . Heart attack Father   . Lung cancer Father   . Breast cancer Mother   . Heart attack Brother   . Heart disease Sister      HOME MEDICATIONS: Allergies as of 09/26/2019      Reactions   Bee Venom Anaphylaxis  Flu Virus Vaccine Shortness Of Breath   Hydrocodone-homatropine Itching   Lipitor [atorvastatin]    Muscle aches and cramping   Proair Hfa [albuterol]    headaches   Xarelto [rivaroxaban]    GI bleed      Medication List       Accurate as of September 26, 2019 10:59 AM. If you have any questions, ask your nurse or doctor.        Accu-Chek Aviva device by Other route. Use as instructed   acetaminophen 325 MG tablet Commonly known as: TYLENOL Take 650 mg by mouth at bedtime as needed for moderate pain.   apixaban 5 MG Tabs tablet Commonly known  as: Eliquis Take 1 tablet (5 mg total) by mouth 2 (two) times daily.   Berberine Complex 200-200-50 MG Caps Generic drug: Barberry-Oreg Grape-Goldenseal Take by mouth 2 (two) times daily.   flecainide 50 MG tablet Commonly known as: TAMBOCOR Take 1 tablet (50 mg total) by mouth 2 (two) times daily.   furosemide 20 MG tablet Commonly known as: LASIX Take 20-40 mg by mouth daily as needed for fluid or edema.   glipiZIDE 5 MG tablet Commonly known as: GLUCOTROL Take 0.5 tablets (2.5 mg total) by mouth daily before breakfast.   magnesium oxide 400 MG tablet Commonly known as: MAG-OX Take 400 mg by mouth daily.   melatonin 3 MG Tabs tablet Take 3 mg by mouth at bedtime.   metFORMIN 500 MG tablet Commonly known as: GLUCOPHAGE Take 250 mg by mouth 2 (two) times daily.   metoprolol tartrate 25 MG tablet Commonly known as: LOPRESSOR Take 1 tablet (25 mg total) by mouth 2 (two) times daily.   multivitamin with minerals Tabs tablet Take 1 tablet by mouth daily.   traMADol 50 MG tablet Commonly known as: ULTRAM Take 50 mg by mouth at bedtime as needed for moderate pain.   Vitamin D 50 MCG (2000 UT) Caps Take 4,000 Units by mouth daily.        OBJECTIVE:   Vital Signs: BP 126/80   Pulse 63   Resp 18   Ht 5\' 10"  (1.778 m)   Wt 199 lb 6.4 oz (90.4 kg)   SpO2 97%   BMI 28.61 kg/m   Wt Readings from Last 3 Encounters:  09/26/19 199 lb 6.4 oz (90.4 kg)  06/30/19 203 lb 6.4 oz (92.3 kg)  06/17/19 202 lb 6.4 oz (91.8 kg)     Exam: General: Pt appears well and is in NAD  Lungs: Clear with good BS bilat with no rales, rhonchi, or wheezes  Heart: RRR with normal S1 and S2 and no gallops; no murmurs; no rub  Extremities: No pretibial edema.  Neuro: MS is good with appropriate affect, pt is alert and Ox3      DM foot exam: 06/17/2019  The skin of the feet is without sores or ulcerations. The pedal pulses are undetectable  The sensation is intact to a screening  5.07, 10 gram monofilament bilaterally       DATA REVIEWED:  Lab Results  Component Value Date   HGBA1C 6.0 (A) 09/26/2019   HGBA1C 7.4 (H) 04/22/2013   HGBA1C 6.2 (H) 02/29/2012   Lab Results  Component Value Date   LDLCALC 138 (H) 04/22/2013   CREATININE 0.98 12/19/2018    Lab Results  Component Value Date   CHOL 210 (H) 04/22/2013   HDL 32 (L) 04/22/2013   LDLCALC 138 (H) 04/22/2013   TRIG 200 (H) 04/22/2013  CHOLHDL 6.6 04/22/2013       04/26/2019  A1c 11.7%  BUN/Cr 9/1.01 GFR 64  TSH 1.220 HDL 35 Tg 197 LDL 191 MA/Cr ratio 16    6/56/8127 A1c 11.0%  ASSESSMENT / PLAN / RECOMMENDATIONS:   1) Type 2 Diabetes Mellitus, Optimally controlled, Without complications - Most recent A1c of 6.0 %. Goal A1c < 7.0 %.    - A1c down from 11.7%  - I have praised and congratulated the pt on optimizing glucose control - Somehow she did not reduce her Glipizide to half a tablet with breakfast from message in 07/2019.  - Given her A1c is 6.0% , I am going to reduce her Glipizide as below - To simplify her regimen, I have asked her to change the metformin to 1 tablet once daily, rather than half twice a day     MEDICATIONS:  Metformin 500 mg, 1 tablet daily   Decrease Glipizide 5 mg, HALF a tablet before breakfast   EDUCATION / INSTRUCTIONS:  BG monitoring instructions: Patient is instructed to check her blood sugars 1 times a day  Call [facility] if: BG persistently < 70 . I reviewed the Rule of 15 for the treatment of hypoglycemia in detail with the patient. Literature supplied.  2) Diabetic complications:   Eye: Does not have known diabetic retinopathy.   Neuro/ Feet: Does not have known diabetic peripheral neuropathy .   Renal: Patient does not have known baseline CKD. She   is not on an ACEI/ARB at present.    3) Dyslipidemia: Patient is intolerant to Atorvastatin nor rosuvastatin. She has no interest in trying other  options.   F/U in 6 months    Signed electronically by: Lyndle Herrlich, MD  [facility] Endocrinology  [facility] [address] [place], [place] [address] Phone: [phone] FAX: [phone]   CC: Juluis Rainier, MD [address] [place] [address] Phone: [phone]  Fax: [phone]  Return to Endocrinology clinic as below: No future appointments.

## 2019-09-26 NOTE — Patient Instructions (Addendum)
-   Continue Metformin 500 mg ,1 tablet daily  - Decrease Glipizide 5 mg, HALF a tablet  before breakfast      HOW TO TREAT LOW BLOOD SUGARS (Blood sugar LESS THAN 70 MG/DL)  Please follow the RULE OF 15 for the treatment of hypoglycemia treatment (when your (blood sugars are less than 70 mg/dL)    STEP 1: Take 15 grams of carbohydrates when your blood sugar is low, which includes:   3-4 GLUCOSE TABS  OR  3-4 OZ OF JUICE OR REGULAR SODA OR  ONE TUBE OF GLUCOSE GEL     STEP 2: RECHECK blood sugar in 15 MINUTES STEP 3: If your blood sugar is still low at the 15 minute recheck --> then, go back to STEP 1 and treat AGAIN with another 15 grams of carbohydrates.

## 2019-10-27 DIAGNOSIS — M5412 Radiculopathy, cervical region: Secondary | ICD-10-CM | POA: Insufficient documentation

## 2019-10-27 DIAGNOSIS — M47816 Spondylosis without myelopathy or radiculopathy, lumbar region: Secondary | ICD-10-CM | POA: Insufficient documentation

## 2019-10-28 ENCOUNTER — Other Ambulatory Visit: Payer: Self-pay | Admitting: Neurosurgery

## 2019-10-28 DIAGNOSIS — M545 Low back pain, unspecified: Secondary | ICD-10-CM

## 2019-10-28 DIAGNOSIS — G8929 Other chronic pain: Secondary | ICD-10-CM

## 2019-11-06 ENCOUNTER — Other Ambulatory Visit: Payer: Self-pay | Admitting: Physical Medicine and Rehabilitation

## 2019-11-06 DIAGNOSIS — R590 Localized enlarged lymph nodes: Secondary | ICD-10-CM

## 2019-11-06 DIAGNOSIS — G894 Chronic pain syndrome: Secondary | ICD-10-CM

## 2019-11-06 DIAGNOSIS — L905 Scar conditions and fibrosis of skin: Secondary | ICD-10-CM

## 2019-11-21 ENCOUNTER — Ambulatory Visit
Admission: RE | Admit: 2019-11-21 | Discharge: 2019-11-21 | Disposition: A | Discharge status: Home / Self Care | Payer: Medicare Other | Source: Ambulatory Visit | Attending: Physical Medicine and Rehabilitation | Admitting: Physical Medicine and Rehabilitation

## 2019-11-21 ENCOUNTER — Other Ambulatory Visit: Payer: Self-pay | Admitting: Physical Medicine and Rehabilitation

## 2019-11-21 DIAGNOSIS — G894 Chronic pain syndrome: Secondary | ICD-10-CM

## 2019-11-21 DIAGNOSIS — R590 Localized enlarged lymph nodes: Secondary | ICD-10-CM

## 2019-11-21 DIAGNOSIS — R52 Pain, unspecified: Secondary | ICD-10-CM

## 2019-11-21 DIAGNOSIS — L905 Scar conditions and fibrosis of skin: Secondary | ICD-10-CM

## 2019-11-22 ENCOUNTER — Ambulatory Visit
Admission: RE | Admit: 2019-11-22 | Discharge: 2019-11-22 | Disposition: A | Discharge status: Home / Self Care | Payer: Medicare Other | Source: Ambulatory Visit | Attending: Neurosurgery | Admitting: Neurosurgery

## 2019-11-22 ENCOUNTER — Other Ambulatory Visit: Payer: Self-pay

## 2019-11-22 DIAGNOSIS — G8929 Other chronic pain: Secondary | ICD-10-CM

## 2019-11-22 DIAGNOSIS — M545 Low back pain, unspecified: Secondary | ICD-10-CM

## 2019-12-19 ENCOUNTER — Other Ambulatory Visit: Payer: Self-pay | Admitting: Family Medicine

## 2019-12-19 DIAGNOSIS — N644 Mastodynia: Secondary | ICD-10-CM

## 2019-12-30 ENCOUNTER — Other Ambulatory Visit: Payer: Self-pay | Admitting: Internal Medicine

## 2019-12-30 MED ORDER — GLIPIZIDE 5 MG PO TABS
2.5000 mg | ORAL_TABLET | Freq: Every day | ORAL | 1 refills | Status: DC
Start: 2019-12-30 — End: 2020-07-20

## 2019-12-30 NOTE — Telephone Encounter (Signed)
Sent as requested to [facility] pharmacy

## 2019-12-30 NOTE — Telephone Encounter (Signed)
Patient requests a new RX with refills for glipiZIDE (GLUCOTROL) 5 MG tablet be sent to  [facility] [address], [place] - [address] Phone:  [phone]  Fax:  [phone]

## 2020-01-20 ENCOUNTER — Other Ambulatory Visit (HOSPITAL_COMMUNITY): Payer: Self-pay

## 2020-01-21 ENCOUNTER — Telehealth: Payer: Self-pay | Admitting: Nurse Practitioner

## 2020-01-21 ENCOUNTER — Other Ambulatory Visit: Payer: Self-pay | Admitting: Nurse Practitioner

## 2020-01-21 DIAGNOSIS — E1165 Type 2 diabetes mellitus with hyperglycemia: Secondary | ICD-10-CM | POA: Insufficient documentation

## 2020-01-21 DIAGNOSIS — I480 Paroxysmal atrial fibrillation: Secondary | ICD-10-CM

## 2020-01-21 DIAGNOSIS — D62 Acute posthemorrhagic anemia: Secondary | ICD-10-CM

## 2020-01-21 DIAGNOSIS — I482 Chronic atrial fibrillation, unspecified: Secondary | ICD-10-CM

## 2020-01-21 DIAGNOSIS — G4720 Circadian rhythm sleep disorder, unspecified type: Secondary | ICD-10-CM | POA: Insufficient documentation

## 2020-01-21 DIAGNOSIS — G4733 Obstructive sleep apnea (adult) (pediatric): Secondary | ICD-10-CM

## 2020-01-21 DIAGNOSIS — F32A Depression, unspecified: Secondary | ICD-10-CM | POA: Insufficient documentation

## 2020-01-21 DIAGNOSIS — R1032 Left lower quadrant pain: Secondary | ICD-10-CM | POA: Insufficient documentation

## 2020-01-21 DIAGNOSIS — Z6831 Body mass index (BMI) 31.0-31.9, adult: Secondary | ICD-10-CM | POA: Insufficient documentation

## 2020-01-21 DIAGNOSIS — U071 COVID-19: Secondary | ICD-10-CM

## 2020-01-21 DIAGNOSIS — E039 Hypothyroidism, unspecified: Secondary | ICD-10-CM | POA: Insufficient documentation

## 2020-01-21 DIAGNOSIS — Z7901 Long term (current) use of anticoagulants: Secondary | ICD-10-CM

## 2020-01-21 DIAGNOSIS — E7800 Pure hypercholesterolemia, unspecified: Secondary | ICD-10-CM | POA: Insufficient documentation

## 2020-01-21 DIAGNOSIS — N200 Calculus of kidney: Secondary | ICD-10-CM | POA: Insufficient documentation

## 2020-01-21 DIAGNOSIS — J4541 Moderate persistent asthma with (acute) exacerbation: Secondary | ICD-10-CM

## 2020-01-21 DIAGNOSIS — I340 Nonrheumatic mitral (valve) insufficiency: Secondary | ICD-10-CM

## 2020-01-21 DIAGNOSIS — I719 Aortic aneurysm of unspecified site, without rupture: Secondary | ICD-10-CM | POA: Insufficient documentation

## 2020-01-21 DIAGNOSIS — I739 Peripheral vascular disease, unspecified: Secondary | ICD-10-CM

## 2020-01-21 DIAGNOSIS — I10 Essential (primary) hypertension: Secondary | ICD-10-CM

## 2020-01-21 DIAGNOSIS — N9489 Other specified conditions associated with female genital organs and menstrual cycle: Secondary | ICD-10-CM | POA: Insufficient documentation

## 2020-01-21 DIAGNOSIS — D509 Iron deficiency anemia, unspecified: Secondary | ICD-10-CM | POA: Insufficient documentation

## 2020-01-21 NOTE — Telephone Encounter (Signed)
I connected by phone with Brittany Ford on 01/21/2020 at 4:46 PM to discuss the potential use of a new treatment for mild to moderate COVID-19 viral infection in non-hospitalized patients.  This patient is a 74 y.o. female that meets the FDA criteria for Emergency Use Authorization of COVID monoclonal antibody casirivimab/imdevimab, bamlanivimab/etesevimab, or sotrovimab.  Has a (+) direct SARS-CoV-2 viral test result  Has mild or moderate COVID-19   Is NOT hospitalized due to COVID-19  Is within 10 days of symptom onset  Has at least one of the high risk factor(s) for progression to severe COVID-19 and/or hospitalization as defined in EUA.  Specific high risk criteria : Older age (>/= 73 yo), BMI > 25, Diabetes, Cardiovascular disease or hypertension and Chronic Lung Disease   Sx Onset: 12/08  Symptoms: Cough, shortness of breath, congestion, fatigue, decreased energy, decreased appetite   I have spoken and communicated the following to the patient or parent/caregiver regarding COVID monoclonal antibody treatment:  1. FDA has authorized the emergency use for the treatment of mild to moderate COVID-19 in adults and pediatric patients with positive results of direct SARS-CoV-2 viral testing who are 82 years of age and older weighing at least 40 kg, and who are at high risk for progressing to severe COVID-19 and/or hospitalization.  2. The significant known and potential risks and benefits of COVID monoclonal antibody, and the extent to which such potential risks and benefits are unknown.  3. Information on available alternative treatments and the risks and benefits of those alternatives, including clinical trials.  4. Patients treated with COVID monoclonal antibody should continue to self-isolate and use infection control measures (e.g., wear mask, isolate, social distance, avoid sharing personal items, clean and disinfect "high touch" surfaces, and frequent handwashing) according  to CDC guidelines.   5. The patient or parent/caregiver has the option to accept or refuse COVID monoclonal antibody treatment.  After reviewing this information with the patient, the patient has agreed to receive one of the available covid 19 monoclonal antibodies and will be provided an appropriate fact sheet prior to infusion. [name], NP 01/21/2020 4:46 PM

## 2020-01-22 ENCOUNTER — Ambulatory Visit (HOSPITAL_COMMUNITY)
Admission: RE | Admit: 2020-01-22 | Discharge: 2020-01-22 | Disposition: A | Discharge status: Home / Self Care | Payer: Medicare Other | Source: Ambulatory Visit | Attending: Pulmonary Disease | Admitting: Pulmonary Disease

## 2020-01-22 DIAGNOSIS — I340 Nonrheumatic mitral (valve) insufficiency: Secondary | ICD-10-CM | POA: Diagnosis present

## 2020-01-22 DIAGNOSIS — U071 COVID-19: Secondary | ICD-10-CM | POA: Diagnosis present

## 2020-01-22 DIAGNOSIS — Z23 Encounter for immunization: Secondary | ICD-10-CM | POA: Insufficient documentation

## 2020-01-22 DIAGNOSIS — I739 Peripheral vascular disease, unspecified: Secondary | ICD-10-CM | POA: Diagnosis present

## 2020-01-22 DIAGNOSIS — I10 Essential (primary) hypertension: Secondary | ICD-10-CM | POA: Insufficient documentation

## 2020-01-22 DIAGNOSIS — G4733 Obstructive sleep apnea (adult) (pediatric): Secondary | ICD-10-CM | POA: Insufficient documentation

## 2020-01-22 DIAGNOSIS — J4541 Moderate persistent asthma with (acute) exacerbation: Secondary | ICD-10-CM | POA: Insufficient documentation

## 2020-01-22 DIAGNOSIS — E1165 Type 2 diabetes mellitus with hyperglycemia: Secondary | ICD-10-CM | POA: Diagnosis present

## 2020-01-22 DIAGNOSIS — E039 Hypothyroidism, unspecified: Secondary | ICD-10-CM | POA: Diagnosis present

## 2020-01-22 DIAGNOSIS — Z7901 Long term (current) use of anticoagulants: Secondary | ICD-10-CM | POA: Insufficient documentation

## 2020-01-22 DIAGNOSIS — I719 Aortic aneurysm of unspecified site, without rupture: Secondary | ICD-10-CM | POA: Insufficient documentation

## 2020-01-22 DIAGNOSIS — I482 Chronic atrial fibrillation, unspecified: Secondary | ICD-10-CM | POA: Diagnosis present

## 2020-01-22 DIAGNOSIS — I480 Paroxysmal atrial fibrillation: Secondary | ICD-10-CM | POA: Diagnosis present

## 2020-01-22 DIAGNOSIS — D62 Acute posthemorrhagic anemia: Secondary | ICD-10-CM | POA: Diagnosis present

## 2020-01-22 MED ORDER — METHYLPREDNISOLONE SODIUM SUCC 125 MG IJ SOLR: 125.0000 mg | Freq: Once | INTRAMUSCULAR | Status: DC | PRN

## 2020-01-22 MED ORDER — SODIUM CHLORIDE 0.9 % IV SOLN: INTRAVENOUS | Status: DC | PRN

## 2020-01-22 MED ORDER — SODIUM CHLORIDE 0.9 % IV SOLN: Freq: Once | INTRAVENOUS | Status: AC

## 2020-01-22 MED ORDER — FAMOTIDINE IN NACL 20-0.9 MG/50ML-% IV SOLN: 20.0000 mg | Freq: Once | INTRAVENOUS | Status: DC | PRN

## 2020-01-22 MED ORDER — DIPHENHYDRAMINE HCL 50 MG/ML IJ SOLN: 50.0000 mg | Freq: Once | INTRAMUSCULAR | Status: DC | PRN

## 2020-01-22 MED ORDER — EPINEPHRINE 0.3 MG/0.3ML IJ SOAJ: 0.3000 mg | Freq: Once | INTRAMUSCULAR | Status: DC | PRN

## 2020-01-22 NOTE — Progress Notes (Signed)
°  Diagnosis: COVID-19 ° °Physician:  Patrick Wright ° °Procedure: Covid Infusion Clinic Med: bamlanivimab\etesevimab infusion - Provided patient with bamlanimivab\etesevimab fact sheet for patients, parents and caregivers prior to infusion. ° °Complications: No immediate complications noted. ° °Discharge: Discharged home  ° °Carpenter, DeKina L °01/22/2020 ° °

## 2020-01-22 NOTE — Progress Notes (Signed)
Patient reviewed Fact Sheet for Patients, Parents, and Caregivers for Emergency Use Authorization (EUA) of bamlanivimab and etesevimab for the Treatment of Coronavirus. Patient also reviewed and is agreeable to the estimated cost of treatment. Patient is agreeable to proceed.   

## 2020-01-22 NOTE — Discharge Instructions (Signed)
10 Things You Can Do to Manage Your COVID-19 Symptoms at Home If you have possible or confirmed COVID-19: 1. Stay home from work and school. And stay away from other public places. If you must go out, avoid using any kind of public transportation, ridesharing, or taxis. 2. Monitor your symptoms carefully. If your symptoms get worse, call your healthcare provider immediately. 3. Get rest and stay hydrated. 4. If you have a medical appointment, call the healthcare provider ahead of time and tell them that you have or may have COVID-19. 5. For medical emergencies, call 911 and notify the dispatch personnel that you have or may have COVID-19. 6. Cover your cough and sneezes with a tissue or use the inside of your elbow. 7. Wash your hands often with soap and water for at least 20 seconds or clean your hands with an alcohol-based hand sanitizer that contains at least 60% alcohol. 8. As much as possible, stay in a specific room and away from other people in your home. Also, you should use a separate bathroom, if available. If you need to be around other people in or outside of the home, wear a mask. 9. Avoid sharing personal items with other people in your household, like dishes, towels, and bedding. 10. Clean all surfaces that are touched often, like counters, tabletops, and doorknobs. Use household cleaning sprays or wipes according to the label instructions. cdc.gov/coronavirus 08/07/2018 This information is not intended to replace advice given to you by your health care provider. Make sure you discuss any questions you have with your health care provider. Document Revised: 01/09/2019 Document Reviewed: 01/09/2019 Elsevier Patient Education  2020 Elsevier Inc. What types of side effects do monoclonal antibody drugs cause?  Common side effects  In general, the more common side effects caused by monoclonal antibody drugs include: . Allergic reactions, such as hives or itching . Flu-like signs and  symptoms, including chills, fatigue, fever, and muscle aches and pains . Nausea, vomiting . Diarrhea . Skin rashes . Low blood pressure   The CDC is recommending patients who receive monoclonal antibody treatments wait at least 90 days before being vaccinated.  Currently, there are no data on the safety and efficacy of mRNA COVID-19 vaccines in persons who received monoclonal antibodies or convalescent plasma as part of COVID-19 treatment. Based on the estimated half-life of such therapies as well as evidence suggesting that reinfection is uncommon in the 90 days after initial infection, vaccination should be deferred for at least 90 days, as a precautionary measure until additional information becomes available, to avoid interference of the antibody treatment with vaccine-induced immune responses. If you have any questions or concerns after the infusion please call the Advanced Practice Provider on call at [phone]. This number is ONLY intended for your use regarding questions or concerns about the infusion post-treatment side-effects.  Please do not provide this number to others for use. For return to work notes please contact your primary care provider.   If someone you know is interested in receiving treatment please have them call the COVID hotline at [phone].   

## 2020-01-23 ENCOUNTER — Other Ambulatory Visit: Payer: Medicare Other

## 2020-02-13 ENCOUNTER — Other Ambulatory Visit: Payer: Medicare Other

## 2020-02-13 DIAGNOSIS — Z20822 Contact with and (suspected) exposure to covid-19: Secondary | ICD-10-CM

## 2020-02-16 LAB — NOVEL CORONAVIRUS, NAA: SARS-CoV-2, NAA: NOT DETECTED

## 2020-03-12 ENCOUNTER — Other Ambulatory Visit: Payer: Medicare Other

## 2020-03-22 ENCOUNTER — Telehealth: Payer: Self-pay | Admitting: Interventional Cardiology

## 2020-03-22 NOTE — Telephone Encounter (Signed)
**Note De-Identified  Obfuscation** The pt states that she has completed her [employer] application for assistance for Eliquis and states that she will drop all off at Dr [name] office tomorrow. She is aware that we will take care of the provider page and will fax all to [employer].  Per her request I did send a message to scheduling to contact the pt to schedule her overdue f/u with Dr Katrinka Blazing.  She thanked me for calling her back.

## 2020-03-22 NOTE — Telephone Encounter (Signed)
Pt called in and stated she she has some paperwork that needs to be filled out for a med.  She would not provide any other info.  She would like to speak with nurse   Best number [phone]

## 2020-03-23 ENCOUNTER — Telehealth: Payer: Self-pay | Admitting: Interventional Cardiology

## 2020-03-23 NOTE — Telephone Encounter (Signed)
  Patient wanted to let you know that the insurance card that she gave you that has Dr Hyacinth Meeker on it is her new card. The one with Dr Zachery Dauer is the old card and can be destroyed.

## 2020-03-24 NOTE — Telephone Encounter (Signed)
**Note De-Identified  Obfuscation** The pt states that she dropped her [employer] application for Eliquis off at the office yesterday and wants me to be aware that if she included a copy of her ins card that has Dr Juluis Rainier name on it to discard as it is old. She states that she also included a copy of her new ins card that has Dr Garen Lah name on it that she wants to be submitted with her application.  She is aware that I have not received her application yet but that as soon as I do I will discard the copy of her old ins card and that I will not include it with her application.  She thanked me for calling her back.

## 2020-03-24 NOTE — Telephone Encounter (Signed)
Brittany Ford is returning Lynn's call. Please advise.

## 2020-03-24 NOTE — Telephone Encounter (Signed)
**Note De-Identified  Obfuscation** I am unaware of the pts Ins card as she did not provide me with her ins info.  She and I spoke about her applying for asst with [employer] for her Eliquis.  I called the pt but got no answer so I left a message asking her to call us back at Dr [name] office at [phone] and to ask to s/w Larita Fife if this is concerning her [employer]MSPA[employer] application for Eliquis or ask the operator to let her s/w someone concerning her ins info we have on file for her.

## 2020-03-25 NOTE — Telephone Encounter (Signed)
I have received the pts [employer] application for Eliquis along with documents. I completed the MD page of the application and emailed all to Dr [name] nurse so she can obtain his signature, date it, and to fax all to [facility] at fax number written on cover letter included.

## 2020-03-30 NOTE — Telephone Encounter (Signed)
Paperwork signed and faxed.  Confirmation sheet received.

## 2020-03-31 ENCOUNTER — Telehealth: Payer: Self-pay | Admitting: Interventional Cardiology

## 2020-03-31 NOTE — Telephone Encounter (Signed)
Encounter not needed

## 2020-04-02 ENCOUNTER — Ambulatory Visit: Payer: Medicare Other | Admitting: Internal Medicine

## 2020-04-07 ENCOUNTER — Telehealth: Payer: Self-pay | Admitting: Interventional Cardiology

## 2020-04-07 NOTE — Telephone Encounter (Signed)
**Note De-Identified  Obfuscation** The pt states that [employer] advised her that she was approved for pt asst for Eliquis but they could not tell her when her Eliquis will be shipped out as their computers are down. I advised her that we will leave he 2 sample  boxes of Eliquis 5 mg  along with her original BMSPA[employer] application/documents if we can find them but if not she is ok with getting a copy.  I have sent an email to [place], Clinical Supervisor asking her to leave the pts Eliquis samples and either her original application or the copy that I attached in the email up front for the pt to pick up.

## 2020-04-07 NOTE — Telephone Encounter (Signed)
New Message:          She wants to know if you received her paper work that she left for her Eliquis..She said if possible will need some samples of Eliquis until her medicine gets here please.Marland Kitchen

## 2020-04-07 NOTE — Telephone Encounter (Signed)
**Note De-Identified  Obfuscation** Letter received from [facility] stating that they did approve te pt for asst with her Eliquis until 02/05/2021. BMSPAF Case#: [id number]  The pt is aware.

## 2020-04-07 NOTE — Telephone Encounter (Addendum)
**Note De-Identified  Obfuscation** I gave the pt [employer]'s phone number and asked her to call to check the progress of her application for asst with Eliquis so I will know how many samples she may need until a determination is reached. She is also requesting her original application and all documents back.

## 2020-04-07 NOTE — Telephone Encounter (Addendum)
**Note De-Identified  Obfuscation** Per Dr [name] nurse she located the pts application and has placed it and 2 boxes od Eliquis samples in the front office for the pt to pick up.

## 2020-04-13 ENCOUNTER — Other Ambulatory Visit: Payer: Medicare Other

## 2020-05-19 ENCOUNTER — Other Ambulatory Visit: Payer: Self-pay

## 2020-05-19 ENCOUNTER — Ambulatory Visit: Payer: Medicare Other | Admitting: Internal Medicine

## 2020-05-19 ENCOUNTER — Encounter: Payer: Self-pay | Admitting: Internal Medicine

## 2020-05-19 VITALS — BP 118/66 | HR 60 | Ht 70.0 in | Wt 194.2 lb

## 2020-05-19 DIAGNOSIS — E1159 Type 2 diabetes mellitus with other circulatory complications: Secondary | ICD-10-CM

## 2020-05-19 LAB — POCT GLYCOSYLATED HEMOGLOBIN (HGB A1C): Hemoglobin A1C: 6.6 % — AB (ref 4.0–5.6)

## 2020-05-19 NOTE — Patient Instructions (Signed)
-   Continue Metformin 500 mg ,1 tablet daily  - Continue  Glipizide 5 mg, HALF a tablet  before breakfast       HOW TO TREAT LOW BLOOD SUGARS (Blood sugar LESS THAN 70 MG/DL)  Please follow the RULE OF 15 for the treatment of hypoglycemia treatment (when your (blood sugars are less than 70 mg/dL)    STEP 1: Take 15 grams of carbohydrates when your blood sugar is low, which includes:   3-4 GLUCOSE TABS  OR  3-4 OZ OF JUICE OR REGULAR SODA OR  ONE TUBE OF GLUCOSE GEL     STEP 2: RECHECK blood sugar in 15 MINUTES STEP 3: If your blood sugar is still low at the 15 minute recheck --> then, go back to STEP 1 and treat AGAIN with another 15 grams of carbohydrates.

## 2020-05-19 NOTE — Progress Notes (Signed)
Name: Brittany Ford  Age/ Sex: 75 y.o., female   MRN/ DOB: [id number], 1945/06/15     PCP: Juluis Rainier, MD   Reason for Endocrinology Evaluation: Type 2 Diabetes Mellitus  Initial Endocrine Consultative Visit: 06/17/2019    PATIENT IDENTIFIER: Brittany Ford is a 75 y.o. female with a past medical history of T2DM, PAD, Asthma , A.Fib and DJD. The patient has followed with Endocrinology clinic since 06/17/2019 for consultative assistance with management of her diabetes.  DIABETIC HISTORY:  Brittany Ford was diagnosed with DM in 2010. Intolerant to higher doses of metformin. Her hemoglobin A1c has ranged from 6.2% in 2014, peaking at 11.7% in 2021.  On her initial visit to our clinic she had an A1c of 11.7% . She was on Metformin only. We added Glipizide  SUBJECTIVE:   During the last visit (09/26/2019): A1c 6.0 %. Continue Metformin and decreased  Glipizide      Today (05/19/2020): Brittany Ford is here for a follow up on diabetes.  She checks her blood sugars 1-2 times daily, preprandial to breakfast. The patient has not had hypoglycemic episodes since the last clinic visit.   Has been having joint aches  No nausea or diarrhea    HOME DIABETES REGIMEN:  Metformin 500 mg, 1 tablet daily  Glipizide 5 mg, Half a tablet before Breakfast      Statin: Intolerant to Atorvastatin and rosuvastatin ACE-I/ARB: no    METER DOWNLOAD SUMMARY: 3/30-4/13/2022  Average Number Tests/Day = 0.6 Overall Mean FS Glucose = 100 Standard Deviation = 9  BG Ranges: Low = 80 High = 113    Hypoglycemic Events/30 Days: BG < 50 = 0 Episodes of symptomatic severe hypoglycemia = 0   DIABETIC COMPLICATIONS: Microvascular complications:    Denies: retinopathy, neuropathy   Last eye exam: Completed 10/06/2019  Macrovascular complications:   PAD LE  Denies: CAD,  CVA   HISTORY:  Past Medical History:  Past Medical History:  Diagnosis Date  .  Atrial fibrillation (HCC)   . Atrial flutter (HCC)   . Chest pain    with typical and atypical qualities  . Chronic low back pain   . Diabetes mellitus without complication (HCC)    Diet Controlled  . Dysrhythmia    ATRIAL FIBRILATION  . GERD (gastroesophageal reflux disease)   . Obesity    Past Surgical History:  Past Surgical History:  Procedure Laterality Date  . BREAST EXCISIONAL BIOPSY Right 1990s   benign  . BREAST EXCISIONAL BIOPSY Left 1990s   benign  . COLONOSCOPY  03/01/2012   Procedure: COLONOSCOPY;  Surgeon: [name], MD;  Location: [facility] ENDOSCOPY;  Service: Endoscopy;  Laterality: N/A;  . ESOPHAGOGASTRODUODENOSCOPY  02/29/2012   Procedure: ESOPHAGOGASTRODUODENOSCOPY (EGD);  Surgeon: [name], MD;  Location: [facility] ENDOSCOPY;  Service: Endoscopy;  Laterality: N/A;  . FOOT SURGERY     x2  . HYSTEROSCOPY WITH D & C N/A 02/09/2017   Procedure: DILATATION AND CURETTAGE /HYSTEROSCOPY;  Surgeon: Gerald Leitz, MD;  Location: [facility] ORS;  Service: Gynecology;  Laterality: N/A;  w/Myosure for Polyp  . TONSILLECTOMY      Social History:  reports that she quit smoking about 14 years ago. Her smoking use included cigarettes. She has a 10.00 pack-year smoking history. She has never used smokeless tobacco. She reports that she does not drink alcohol and does not use drugs. Family History:  Family History  Problem Relation Age of Onset  . COPD Father   . Heart attack  Father   . Lung cancer Father   . Breast cancer Mother   . Heart attack Brother   . Heart disease Sister      HOME MEDICATIONS: Allergies as of 05/19/2020      Reactions   Bee Venom Anaphylaxis   Influenza Virus Vaccine Shortness Of Breath   Atorvastatin Other (See Comments)   Muscle aches and cramping   Crestor [rosuvastatin] Other (See Comments)   Hydrocodone-homatropine Itching, Other (See Comments)   Other Other (See Comments)   Xarelto [rivaroxaban]    GI bleed      Medication List       Accurate as  of May 19, 2020  9:20 AM. If you have any questions, ask your nurse or doctor.        Accu-Chek Aviva device by Other route. Use as instructed   Accu-Chek FastClix Lancets Misc See admin instructions.   TYLENOL ARTHRITIS PAIN PO   acetaminophen 325 MG tablet Commonly known as: TYLENOL Take 650 mg by mouth at bedtime as needed for moderate pain.   albuterol 108 (90 Base) MCG/ACT inhaler Commonly known as: VENTOLIN HFA 2 puff as needed   amoxicillin 875 MG tablet Commonly known as: AMOXIL 1 tablet   apixaban 5 MG Tabs tablet Commonly known as: ELIQUIS   apixaban 5 MG Tabs tablet Commonly known as: Eliquis Take 1 tablet (5 mg total) by mouth 2 (two) times daily.   azithromycin 250 MG tablet Commonly known as: ZITHROMAX Take 250 mg by mouth as directed.   Barberry-Oreg Grape-Goldenseal 200-200-50 MG Caps Take by mouth 2 (two) times daily.   benzonatate 200 MG capsule Commonly known as: TESSALON 1 capsule   doxycycline 100 MG capsule Commonly known as: VIBRAMYCIN Take 100 mg by mouth 2 (two) times daily.   flecainide 50 MG tablet Commonly known as: TAMBOCOR Take 1 tablet (50 mg total) by mouth 2 (two) times daily.   furosemide 20 MG tablet Commonly known as: LASIX Take 20-40 mg by mouth daily as needed for fluid or edema.   glipiZIDE 5 MG tablet Commonly known as: GLUCOTROL Take 0.5 tablets (2.5 mg total) by mouth daily before breakfast.   glucose blood test strip Use to check blood sugars   Accu-Chek Guide test strip Generic drug: glucose blood   guaiFENesin 600 MG 12 hr tablet Commonly known as: MUCINEX 1 tablet as needed   magnesium oxide 400 MG tablet Commonly known as: MAG-OX Take 400 mg by mouth daily.   melatonin 3 MG Tabs tablet Take 3 mg by mouth at bedtime.   metFORMIN 500 MG tablet Commonly known as: GLUCOPHAGE Take 250 mg by mouth 2 (two) times daily.   metoprolol tartrate 25 MG tablet Commonly known as: LOPRESSOR Take 1 tablet  (25 mg total) by mouth 2 (two) times daily.   multivitamin Chew chewable tablet Chew by mouth.   multivitamin with minerals Tabs tablet Take 1 tablet by mouth daily.   predniSONE 10 MG (21) Tbpk tablet Commonly known as: STERAPRED UNI-PAK 21 TAB See admin instructions.   traMADol 50 MG tablet Commonly known as: ULTRAM Take 50 mg by mouth at bedtime as needed for moderate pain.   Vitamin D 50 MCG (2000 UT) Caps Take 4,000 Units by mouth daily.   Zinc 50 MG Tabs 1 tablet        OBJECTIVE:   Vital Signs: BP 118/66   Pulse 60   Ht 5\' 10"  (1.778 m)   Wt 194 lb 4 oz (  88.1 kg)   SpO2 99%   BMI 27.87 kg/m   Wt Readings from Last 3 Encounters:  05/19/20 194 lb 4 oz (88.1 kg)  09/26/19 199 lb 6.4 oz (90.4 kg)  06/30/19 203 lb 6.4 oz (92.3 kg)     Exam: General: Pt appears well and is in NAD  Lungs: Clear with good BS bilat with no rales, rhonchi, or wheezes  Heart: RRR with normal S1 and S2 and no gallops; no murmurs; no rub  Extremities: No pretibial edema.  Neuro: MS is good with appropriate affect, pt is alert and Ox3      DM foot exam: 05/19/2020  The skin of the feet is without sores or ulcerations. The pedal pulses are undetectable  The sensation is intact to a screening 5.07, 10 gram monofilament bilaterally       DATA REVIEWED:  Lab Results  Component Value Date   HGBA1C 6.6 (A) 05/19/2020   HGBA1C 6.0 (A) 09/26/2019   HGBA1C 7.4 (H) 04/22/2013   Lab Results  Component Value Date   LDLCALC 138 (H) 04/22/2013   CREATININE 0.98 12/19/2018    Lab Results  Component Value Date   CHOL 210 (H) 04/22/2013   HDL 32 (L) 04/22/2013   LDLCALC 138 (H) 04/22/2013   TRIG 200 (H) 04/22/2013   CHOLHDL 6.6 04/22/2013       04/26/2019  A1c 11.7%  BUN/Cr 9/1.01 GFR 64  TSH 1.220 HDL 35 Tg 197 LDL 191 MA/Cr ratio 16    0/11/9321 A1c 11.0%  ASSESSMENT / PLAN / RECOMMENDATIONS:   1) Type 2 Diabetes Mellitus, Optimally controlled, Without  complications - Most recent A1c of 6.6 %. Goal A1c < 7.0 %.    - A1c continues to be optimal without hypoglycemia  - She is trying to wean herself off Metformin, she doesn't like it, we discussed metformin benefits  - No change today   MEDICATIONS:  Continue Metformin 500 mg, 1 tablet daily   Continue Glipizide 5 mg, HALF a tablet before breakfast   EDUCATION / INSTRUCTIONS:  BG monitoring instructions: Patient is instructed to check her blood sugars 3 times a week   Call [facility] if: BG persistently < 70 . I reviewed the Rule of 15 for the treatment of hypoglycemia in detail with the patient. Literature supplied.  2) Diabetic complications:   Eye: Does not have known diabetic retinopathy.   Neuro/ Feet: Does not have known diabetic peripheral neuropathy .    Renal: Patient does not have known baseline CKD. She   is not on an ACEI/ARB at present.   3) Dyslipidemia :  - Intolerant to statins and has declined any alternatives   F/U in 6 months    Signed electronically by: Lyndle Herrlich, MD  [facility] End[facility]  [facility] [address] [place], [place] [address] Phone: [phone] FAX: [phone]   CC: Juluis Rainier, MD [address] [place] [address] Phone: [phone]  Fax: [phone]  Return to Endocrinology clinic as below: Future Appointments  Date Time Provider Department Center  05/21/2020  9:40 AM [facility] DIAG [place] 1 [facility] GI-BREAST CE  05/21/2020  9:50 AM [facility] [place] 1 [facility] GI-BREAST CE  05/21/2020  9:55 AM [facility] [place] 1 [facility] GI-BREAST CE  07/16/2020  8:40 AM Lyn Records, MD [facility]

## 2020-05-21 ENCOUNTER — Other Ambulatory Visit: Payer: Medicare Other

## 2020-05-31 DIAGNOSIS — I739 Peripheral vascular disease, unspecified: Secondary | ICD-10-CM | POA: Diagnosis not present

## 2020-05-31 DIAGNOSIS — E1165 Type 2 diabetes mellitus with hyperglycemia: Secondary | ICD-10-CM | POA: Diagnosis not present

## 2020-05-31 DIAGNOSIS — G4733 Obstructive sleep apnea (adult) (pediatric): Secondary | ICD-10-CM | POA: Diagnosis not present

## 2020-05-31 DIAGNOSIS — Z0000 Encounter for general adult medical examination without abnormal findings: Secondary | ICD-10-CM | POA: Diagnosis not present

## 2020-05-31 DIAGNOSIS — G720 Drug-induced myopathy: Secondary | ICD-10-CM | POA: Diagnosis not present

## 2020-05-31 DIAGNOSIS — E559 Vitamin D deficiency, unspecified: Secondary | ICD-10-CM | POA: Diagnosis not present

## 2020-05-31 DIAGNOSIS — D509 Iron deficiency anemia, unspecified: Secondary | ICD-10-CM | POA: Diagnosis not present

## 2020-05-31 DIAGNOSIS — E039 Hypothyroidism, unspecified: Secondary | ICD-10-CM | POA: Diagnosis not present

## 2020-06-09 DIAGNOSIS — M2550 Pain in unspecified joint: Secondary | ICD-10-CM | POA: Diagnosis not present

## 2020-06-09 DIAGNOSIS — D509 Iron deficiency anemia, unspecified: Secondary | ICD-10-CM | POA: Diagnosis not present

## 2020-06-09 DIAGNOSIS — R059 Cough, unspecified: Secondary | ICD-10-CM | POA: Diagnosis not present

## 2020-06-09 DIAGNOSIS — I739 Peripheral vascular disease, unspecified: Secondary | ICD-10-CM | POA: Diagnosis not present

## 2020-06-09 DIAGNOSIS — R1013 Epigastric pain: Secondary | ICD-10-CM | POA: Diagnosis not present

## 2020-06-09 DIAGNOSIS — G4733 Obstructive sleep apnea (adult) (pediatric): Secondary | ICD-10-CM | POA: Diagnosis not present

## 2020-06-09 DIAGNOSIS — E7800 Pure hypercholesterolemia, unspecified: Secondary | ICD-10-CM | POA: Diagnosis not present

## 2020-06-09 DIAGNOSIS — E1165 Type 2 diabetes mellitus with hyperglycemia: Secondary | ICD-10-CM | POA: Diagnosis not present

## 2020-06-09 DIAGNOSIS — E559 Vitamin D deficiency, unspecified: Secondary | ICD-10-CM | POA: Diagnosis not present

## 2020-06-09 DIAGNOSIS — R1032 Left lower quadrant pain: Secondary | ICD-10-CM | POA: Diagnosis not present

## 2020-06-28 ENCOUNTER — Other Ambulatory Visit: Payer: Self-pay | Admitting: Interventional Cardiology

## 2020-07-08 DIAGNOSIS — M545 Low back pain, unspecified: Secondary | ICD-10-CM | POA: Diagnosis not present

## 2020-07-08 DIAGNOSIS — M48061 Spinal stenosis, lumbar region without neurogenic claudication: Secondary | ICD-10-CM | POA: Diagnosis not present

## 2020-07-10 ENCOUNTER — Other Ambulatory Visit: Payer: Self-pay | Admitting: Interventional Cardiology

## 2020-07-14 ENCOUNTER — Other Ambulatory Visit: Payer: Self-pay

## 2020-07-14 ENCOUNTER — Ambulatory Visit
Admission: RE | Admit: 2020-07-14 | Discharge: 2020-07-14 | Disposition: A | Discharge status: Home / Self Care | Payer: Medicare Other | Source: Ambulatory Visit | Attending: Family Medicine | Admitting: Family Medicine

## 2020-07-14 ENCOUNTER — Ambulatory Visit: Payer: Medicare Other

## 2020-07-14 DIAGNOSIS — N644 Mastodynia: Secondary | ICD-10-CM

## 2020-07-14 DIAGNOSIS — Z789 Other specified health status: Secondary | ICD-10-CM

## 2020-07-14 DIAGNOSIS — R922 Inconclusive mammogram: Secondary | ICD-10-CM | POA: Diagnosis not present

## 2020-07-14 NOTE — Progress Notes (Signed)
[facility])                                            [facility] Quality Pharmacy Team                                        Statin Quality Measure Assessment    07/14/2020  Brittany Ford 1945/10/13 [id number]  Per review of chart and payor information, this patient has been flagged for non-adherence to the following CMS Quality Measure:   [x]  Statin Use in Persons with Diabetes  [x]  Statin Use in Persons with Cardiovascular Disease  The ASCVD Risk score [name], et al., 2013) failed to calculate for the following reasons:   Cannot find a previous HDL lab   Cannot find a previous total cholesterol lab   Currently prescribed statin:  []  Yes [x]  No     Comments: N/A  History of statin use:            [x]  Yes []  No   Comments: atorvastatin and crestor. Per prior documented allergy history, atorvastatin caused muscle cramps and aching.    Please consider ONE of the following recommendations highlighted in blue:   Initiate high intensity statin Atorvastatin 40mg  once daily, #90, 3 refills   Rosuvastatin 20mg  once daily, #90, 3 refills    Initiate moderate intensity          statin with reduced frequency if prior          statin intolerance 1x weekly, #13, 3 refills   2x weekly, #26, 3 refills   3x weekly, #39, 3 refills    Code for past statin intolerance or other exclusions (required annually)  Drug Induced Myopathy G72.0   Myositis, unspecified M60.9   Rhabdomyolysis M62.82   Prediabetes R73.03   Adverse effect of antihyperlipidemic and antiarteriosclerotic drugs, initial encounter [name]    Thank you for your time,  2014, PharmD Clinical Pharmacist [facility] Cell: [phone]

## 2020-07-14 NOTE — Progress Notes (Signed)
Cardiology Office Note:    Date:  07/16/2020   ID:  Brittany Ford, DOB 1945-04-29, MRN [id number]  PCP:  Sigmund Hazel, MD  Cardiologist:  [name], MD   Referring MD: Juluis Rainier, MD   Chief Complaint  Patient presents with   Atrial Fibrillation   Follow-up    Flecainide therapy   Hyperlipidemia     History of Present Illness:    Brittany Ford is a 75 y.o. female with a hx of paroxysmal atrial fibrillation,  flecainide therapy for rhythm control started in October 2020, anticoagulation, and DM II.  She is not on a statin because of prior difficulty with muscle aches and pains.  Medication was discontinued by primary care.  Prior Doppler study 9 years ago demonstrated obstructive disease.   Wants to simplify her medical regimen.  Wants to be off of Eliquis.  States that friends get off of Eliquis.  Hold further discussion, pointed out that her friends were on the medication because he had blood clots in the leg and not atrial fibrillation.  She now understands that A. fib stroke prophylaxis will be with continuous anticoagulation indefinitely.  Denies chest pain.   Past Medical History:  Diagnosis Date   Atrial fibrillation (HCC)    Atrial flutter (HCC)    Chest pain    with typical and atypical qualities   Chronic low back pain    Diabetes mellitus without complication (HCC)    Diet Controlled   Dysrhythmia    ATRIAL FIBRILATION   GERD (gastroesophageal reflux disease)    Obesity     Past Surgical History:  Procedure Laterality Date   BREAST EXCISIONAL BIOPSY Right 1990s   benign   BREAST EXCISIONAL BIOPSY Left 1990s   benign   COLONOSCOPY  03/01/2012   Procedure: COLONOSCOPY;  Surgeon: [name], MD;  Location: [facility] ENDOSCOPY;  Service: Endoscopy;  Laterality: N/A;   ESOPHAGOGASTRODUODENOSCOPY  02/29/2012   Procedure: ESOPHAGOGASTRODUODENOSCOPY (EGD);  Surgeon: [name], MD;  Location: [facility] ENDOSCOPY;  Service: Endoscopy;   Laterality: N/A;   FOOT SURGERY     x2   HYSTEROSCOPY WITH D & C N/A 02/09/2017   Procedure: DILATATION AND CURETTAGE /HYSTEROSCOPY;  Surgeon: Gerald Leitz, MD;  Location: [facility] ORS;  Service: Gynecology;  Laterality: N/A;  w/Myosure for Polyp   TONSILLECTOMY      Current Medications: Current Meds  Medication Sig   Accu-Chek FastClix Lancets MISC See admin instructions.   ACCU-CHEK GUIDE test strip    Acetaminophen (TYLENOL ARTHRITIS PAIN PO)    apixaban (ELIQUIS) 5 MG TABS tablet Take 1 tablet (5 mg total) by mouth 2 (two) times daily.   Blood Glucose Monitoring Suppl (ACCU-CHEK AVIVA) device by Other route. Use as instructed   Cholecalciferol (VITAMIN D) 2000 units CAPS Take 4,000 Units by mouth daily.   flecainide (TAMBOCOR) 50 MG tablet TAKE 1 TABLET BY MOUTH TWICE A DAY   furosemide (LASIX) 20 MG tablet Take 20-40 mg by mouth daily as needed for fluid or edema.    glipiZIDE (GLUCOTROL) 5 MG tablet Take 0.5 tablets (2.5 mg total) by mouth daily before breakfast.   glucose blood test strip Use to check blood sugars   magnesium oxide (MAG-OX) 400 MG tablet Take 400 mg by mouth daily.   melatonin 3 MG TABS tablet Take 3 mg by mouth at bedtime.   metFORMIN (GLUCOPHAGE) 500 MG tablet Take 250 mg by mouth 2 (two) times daily.   metoprolol tartrate (LOPRESSOR) 25  MG tablet Take 1 tablet (25 mg total) by mouth 2 (two) times daily. Please keep upcoming appointment for future refills. Thank you   Multiple Vitamin (MULTIVITAMIN WITH MINERALS) TABS tablet Take 1 tablet by mouth daily.   predniSONE (STERAPRED UNI-PAK 21 TAB) 10 MG (21) TBPK tablet See admin instructions.   traMADol (ULTRAM) 50 MG tablet Take 50 mg by mouth at bedtime as needed for moderate pain.    Zinc 50 MG TABS 1 tablet   [DISCONTINUED] acetaminophen (TYLENOL) 325 MG tablet Take 650 mg by mouth at bedtime as needed for moderate pain.    [DISCONTINUED] albuterol (VENTOLIN HFA) 108 (90 Base) MCG/ACT inhaler 2 puff as needed    [DISCONTINUED] amoxicillin (AMOXIL) 875 MG tablet 1 tablet   [DISCONTINUED] apixaban (ELIQUIS) 5 MG TABS tablet    [DISCONTINUED] azithromycin (ZITHROMAX) 250 MG tablet Take 250 mg by mouth as directed.   [DISCONTINUED] Barberry-Oreg Grape-Goldenseal 200-200-50 MG CAPS Take by mouth 2 (two) times daily.   [DISCONTINUED] benzonatate (TESSALON) 200 MG capsule 1 capsule   [DISCONTINUED] doxycycline (VIBRAMYCIN) 100 MG capsule Take 100 mg by mouth 2 (two) times daily.   [DISCONTINUED] guaiFENesin (MUCINEX) 600 MG 12 hr tablet 1 tablet as needed   [DISCONTINUED] multivitamin (VIT W/EXTRA C) CHEW chewable tablet Chew by mouth.     Allergies:   Bee venom, Influenza virus vaccine, Atorvastatin, Crestor [rosuvastatin], Hydrocodone bit-homatrop mbr, Other, and Xarelto [rivaroxaban]   Social History   Socioeconomic History   Marital status: Widowed    Spouse name: Not on file   Number of children: 1   Years of education: Not on file   Highest education level: Not on file  Occupational History   Not on file  Tobacco Use   Smoking status: Former    Packs/day: 0.50    Years: 20.00    Pack years: 10.00    Types: Cigarettes    Quit date: 07/20/2005    Years since quitting: 15.0   Smokeless tobacco: Never  Substance and Sexual Activity   Alcohol use: No   Drug use: No   Sexual activity: Not on file  Other Topics Concern   Not on file  Social History Narrative   Not on file   Social Determinants of Health   Financial Resource Strain: Not on file  Food Insecurity: Not on file  Transportation Needs: Not on file  Physical Activity: Not on file  Stress: Not on file  Social Connections: Not on file     Family History: The patient's family history includes Breast cancer in her mother; COPD in her father; Heart attack in her brother and father; Heart disease in her sister; Lung cancer in her father.  ROS:   Please see the history of present illness.    Feels isolated.  Does not sleep  well.  All other systems reviewed and are negative.  EKGs/Labs/Other Studies Reviewed:    The following studies were reviewed today:  Bilateral lower extremity Doppler 2013: IMPRESSION:   1. Moderate left lower extremity arterial occlusive disease at  rest, probably primarily infrapopliteal. Should the patient fail  conservative treatment, consider  MRA runoff (no radiation risk,  can be performed noncontrast in the setting of renal dysfunction)  to better define the site and nature of arterial occlusive disease  and delineate treatment options  EKG:  EKG sinus rhythm, right atrial abnormality, otherwise normal.  Recent Labs: No results found for requested labs within last 8760 hours.  Recent Lipid Panel  Component Value Date/Time   CHOL 210 (H) 04/22/2013 2306   TRIG 200 (H) 04/22/2013 2306   HDL 32 (L) 04/22/2013 2306   CHOLHDL 6.6 04/22/2013 2306   VLDL 40 04/22/2013 2306   LDLCALC 138 (H) 04/22/2013 2306    Physical Exam:    VS:  BP 138/72   Pulse 62   Ht 5\' 11"  (1.803 m)   Wt 192 lb (87.1 kg)   SpO2 98%   BMI 26.78 kg/m     Wt Readings from Last 3 Encounters:  07/16/20 192 lb (87.1 kg)  05/19/20 194 lb 4 oz (88.1 kg)  09/26/19 199 lb 6.4 oz (90.4 kg)     GEN: He appearing. No acute distress HEENT: Normal NECK: No JVD. LYMPHATICS: No lymphadenopathy CARDIAC: No murmur. RRR no gallop, or edema. VASCULAR:  Normal Pulses. No bruits. RESPIRATORY:  Clear to auscultation without rales, wheezing or rhonchi  ABDOMEN: Soft, non-tender, non-distended, No pulsatile mass, MUSCULOSKELETAL: No deformity  SKIN: Warm and dry NEUROLOGIC:  Alert and oriented x 3 PSYCHIATRIC: Doppler study affect   ASSESSMENT:    1. PAF (paroxysmal atrial fibrillation)/atrial flutter   2. Hypertension, unspecified type   3. Chronic anticoagulation   4. Other hyperlipidemia   5. Encounter for monitoring flecainide therapy   6. Decreased pedal pulses    PLAN:    In order of  problems listed above:  Rhythm controlled on flecainide sinus rhythm documented on EKG today Adequate control on current therapy which includes only Tenormin and furosemide Eliquis therapy without bleeding.  Most recent creatinine and hemoglobin were acceptable. Not on therapy for lipids.  LDL is near 150.  She has high risk, has occlusive vascular disease, and is diabetic.  Target LDL less than 70.  Referred to the lipid clinic.  Prior intolerance of rosuvastatin. No EKG evidence of toxicity from flecainide. Bilateral lower extremity Doppler study.  Overall education and awareness concerning secondary risk prevention was discussed in detail: LDL less than 70, hemoglobin A1c less than 7, blood pressure target less than 130/80 mmHg, >150 minutes of moderate aerobic activity per week, avoidance of smoking, weight control (via diet and exercise), and continued surveillance/management of/for obstructive sleep apnea.  She will follow with Dr. 09/28/19 in the future.  Medication Adjustments/Labs and Tests Ordered: Current medicines are reviewed at length with the patient today.  Concerns regarding medicines are outlined above.  Orders Placed This Encounter  Procedures   EKG 12-Lead   No orders of the defined types were placed in this encounter.   There are no Patient Instructions on file for this visit.   Signed, Shari Prows, MD  07/16/2020 9:33 AM    [facility]

## 2020-07-15 ENCOUNTER — Telehealth: Payer: Self-pay | Admitting: Interventional Cardiology

## 2020-07-15 DIAGNOSIS — M545 Low back pain, unspecified: Secondary | ICD-10-CM | POA: Diagnosis not present

## 2020-07-15 NOTE — Telephone Encounter (Signed)
PT is calling with questions requesting a callback from the nurse

## 2020-07-15 NOTE — Telephone Encounter (Signed)
Spoke with pt and she inquired about switching her appt to virtual because she did not have the money to pay her co pay right now.  Advised pt to still come in and just ask to be billed for the co pay.  Pt agreeable to plan.

## 2020-07-16 ENCOUNTER — Other Ambulatory Visit: Payer: Self-pay

## 2020-07-16 ENCOUNTER — Encounter: Payer: Self-pay | Admitting: Interventional Cardiology

## 2020-07-16 ENCOUNTER — Telehealth: Payer: Self-pay | Admitting: Internal Medicine

## 2020-07-16 ENCOUNTER — Ambulatory Visit (INDEPENDENT_AMBULATORY_CARE_PROVIDER_SITE_OTHER): Payer: Medicare Other | Admitting: Interventional Cardiology

## 2020-07-16 ENCOUNTER — Other Ambulatory Visit (HOSPITAL_COMMUNITY): Payer: Self-pay | Admitting: Interventional Cardiology

## 2020-07-16 VITALS — BP 138/72 | HR 62 | Ht 71.0 in | Wt 192.0 lb

## 2020-07-16 DIAGNOSIS — Z79899 Other long term (current) drug therapy: Secondary | ICD-10-CM

## 2020-07-16 DIAGNOSIS — R0989 Other specified symptoms and signs involving the circulatory and respiratory systems: Secondary | ICD-10-CM

## 2020-07-16 DIAGNOSIS — Z7901 Long term (current) use of anticoagulants: Secondary | ICD-10-CM | POA: Diagnosis not present

## 2020-07-16 DIAGNOSIS — I739 Peripheral vascular disease, unspecified: Secondary | ICD-10-CM

## 2020-07-16 DIAGNOSIS — E7849 Other hyperlipidemia: Secondary | ICD-10-CM

## 2020-07-16 DIAGNOSIS — Z5181 Encounter for therapeutic drug level monitoring: Secondary | ICD-10-CM

## 2020-07-16 DIAGNOSIS — I480 Paroxysmal atrial fibrillation: Secondary | ICD-10-CM | POA: Diagnosis not present

## 2020-07-16 DIAGNOSIS — G720 Drug-induced myopathy: Secondary | ICD-10-CM

## 2020-07-16 DIAGNOSIS — T466X5A Adverse effect of antihyperlipidemic and antiarteriosclerotic drugs, initial encounter: Secondary | ICD-10-CM | POA: Diagnosis not present

## 2020-07-16 DIAGNOSIS — I10 Essential (primary) hypertension: Secondary | ICD-10-CM

## 2020-07-16 NOTE — Patient Instructions (Signed)
Medication Instructions:  Your physician recommends that you continue on your current medications as directed. Please refer to the Current Medication list given to you today.  *If you need a refill on your cardiac medications before your next appointment, please call your pharmacy*   Lab Work: None If you have labs (blood work) drawn today and your tests are completely normal, you will receive your results only by: MyChart Message (if you have MyChart) OR A paper copy in the mail If you have any lab test that is abnormal or we need to change your treatment, we will call you to review the results.   Testing/Procedures: Your physician has requested that you have a lower or upper extremity arterial duplex. This test is an ultrasound of the arteries in the legs or arms. It looks at arterial blood flow in the legs and arms. Allow one hour for Lower and Upper Arterial scans. There are no restrictions or special instructions   Follow-Up:  You have been referred to the Lipid Clinic here in our office.   At [facility], you and your health needs are our priority.  As part of our continuing mission to provide you with exceptional heart care, we have created designated Provider Care Teams.  These Care Teams include your primary Cardiologist (physician) and Advanced Practice Providers (APPs -  Physician Assistants and Nurse Practitioners) who all work together to provide you with the care you need, when you need it.  We recommend signing up for the patient portal called "MyChart".  Sign up information is provided on this After Visit Summary.  MyChart is used to connect with patients for Virtual Visits (Telemedicine).  Patients are able to view lab/test results, encounter notes, upcoming appointments, etc.  Non-urgent messages can be sent to your provider as well.   To learn more about what you can do with MyChart, go to [email or web address].    Your next appointment:   1 year(s)  The format  for your next appointment:   In Person  Provider:   You may see Laurance Flatten, MD or one of the following Advanced Practice Providers on your designated Care Team:   Tereso Newcomer, PA-C Chelsea Aus, [place]   Other Instructions

## 2020-07-16 NOTE — Telephone Encounter (Signed)
Pt would like a refill on   glipiZIDE (GLUCOTROL) 5 MG tablet and for it to be a 90 day refill    [facility] [address] [place] - [address]

## 2020-07-19 DIAGNOSIS — M48061 Spinal stenosis, lumbar region without neurogenic claudication: Secondary | ICD-10-CM | POA: Diagnosis not present

## 2020-07-19 DIAGNOSIS — M47816 Spondylosis without myelopathy or radiculopathy, lumbar region: Secondary | ICD-10-CM | POA: Diagnosis not present

## 2020-07-20 ENCOUNTER — Other Ambulatory Visit: Payer: Self-pay | Admitting: Internal Medicine

## 2020-07-20 MED ORDER — GLIPIZIDE 5 MG PO TABS: 2.5000 mg | ORAL_TABLET | Freq: Every day | ORAL | 1 refills | Status: DC

## 2020-07-21 ENCOUNTER — Other Ambulatory Visit: Payer: Self-pay

## 2020-07-21 ENCOUNTER — Ambulatory Visit (HOSPITAL_COMMUNITY)
Admission: RE | Admit: 2020-07-21 | Discharge: 2020-07-21 | Disposition: A | Discharge status: Home / Self Care | Payer: Medicare Other | Source: Ambulatory Visit | Attending: Cardiovascular Disease | Admitting: Cardiovascular Disease

## 2020-07-21 DIAGNOSIS — I739 Peripheral vascular disease, unspecified: Secondary | ICD-10-CM | POA: Insufficient documentation

## 2020-07-21 DIAGNOSIS — R0989 Other specified symptoms and signs involving the circulatory and respiratory systems: Secondary | ICD-10-CM | POA: Diagnosis not present

## 2020-07-22 ENCOUNTER — Telehealth: Payer: Self-pay

## 2020-07-22 NOTE — Telephone Encounter (Signed)
-----   Message from Lyn Records, MD sent at 07/21/2020 11:43 AM EDT ----- Regarding: FW: Please refer to Thera Flake ----- Message ----- From: Interface, [employer] Sent: 07/21/2020  10:43 AM EDT To: Lyn Records, MD

## 2020-07-22 NOTE — Telephone Encounter (Signed)
-----   Message from Lyn Records, MD sent at 07/21/2020 11:41 AM EDT ----- Regarding: FW: Needs referral to Arida/ Allyson Sabal ----- Message ----- From: Interface, [employer] Sent: 07/21/2020  10:43 AM EDT To: Lyn Records, MD

## 2020-07-22 NOTE — Telephone Encounter (Signed)
Spoke with pt and reviewed results and recommendations per Dr. Katrinka Blazing.  Pt agreeable to plan but she wants to hold off on consult for a couple of months due to being worked up for back issues and financial reason.  Scheduled pt to see Dr. Kirke Corin in August and advised to call for sooner appt if needed.  Pt appreciative for call.

## 2020-07-22 NOTE — Telephone Encounter (Signed)
Called to review LE testing from yesterday.  Left message to call back.

## 2020-07-27 DIAGNOSIS — G720 Drug-induced myopathy: Secondary | ICD-10-CM | POA: Insufficient documentation

## 2020-07-27 DIAGNOSIS — T466X5A Adverse effect of antihyperlipidemic and antiarteriosclerotic drugs, initial encounter: Secondary | ICD-10-CM | POA: Insufficient documentation

## 2020-08-02 DIAGNOSIS — M47816 Spondylosis without myelopathy or radiculopathy, lumbar region: Secondary | ICD-10-CM | POA: Diagnosis not present

## 2020-08-10 DIAGNOSIS — I10 Essential (primary) hypertension: Secondary | ICD-10-CM | POA: Diagnosis not present

## 2020-08-10 DIAGNOSIS — M4712 Other spondylosis with myelopathy, cervical region: Secondary | ICD-10-CM | POA: Diagnosis not present

## 2020-08-12 DIAGNOSIS — M47816 Spondylosis without myelopathy or radiculopathy, lumbar region: Secondary | ICD-10-CM | POA: Diagnosis not present

## 2020-08-13 ENCOUNTER — Other Ambulatory Visit: Payer: Self-pay | Admitting: Student

## 2020-08-13 DIAGNOSIS — M4712 Other spondylosis with myelopathy, cervical region: Secondary | ICD-10-CM

## 2020-08-13 DIAGNOSIS — M4722 Other spondylosis with radiculopathy, cervical region: Secondary | ICD-10-CM

## 2020-08-15 ENCOUNTER — Ambulatory Visit
Admission: RE | Admit: 2020-08-15 | Discharge: 2020-08-15 | Disposition: A | Discharge status: Home / Self Care | Payer: Medicare Other | Source: Ambulatory Visit | Attending: Student | Admitting: Student

## 2020-08-15 DIAGNOSIS — M4802 Spinal stenosis, cervical region: Secondary | ICD-10-CM | POA: Diagnosis not present

## 2020-08-15 DIAGNOSIS — M4722 Other spondylosis with radiculopathy, cervical region: Secondary | ICD-10-CM

## 2020-08-15 DIAGNOSIS — M4712 Other spondylosis with myelopathy, cervical region: Secondary | ICD-10-CM

## 2020-08-26 DIAGNOSIS — M4722 Other spondylosis with radiculopathy, cervical region: Secondary | ICD-10-CM | POA: Diagnosis not present

## 2020-08-27 ENCOUNTER — Telehealth (HOSPITAL_BASED_OUTPATIENT_CLINIC_OR_DEPARTMENT_OTHER): Payer: Self-pay | Admitting: *Deleted

## 2020-08-27 NOTE — Telephone Encounter (Signed)
   [facility] Pre-operative Risk Assessment    Patient Name: Brittany Ford  DOB: 06/06/1945 MRN: [id number]  HEARTCARE STAFF:  - IMPORTANT!!!!!! Under Visit Info/Reason for Call, type in Other and utilize the format Clearance MM/DD/YY or Clearance TBD. Do not use dashes or single digits. - Please review there is not already an duplicate clearance open for this procedure. - If request is for dental extraction, please clarify the # of teeth to be extracted. - If the patient is currently at the dentist's office, call Pre-Op Callback Staff (MA/nurse) to input urgent request.  - If the patient is not currently in the dentist office, please route to the Pre-Op pool.  Request for surgical clearance:  What type of surgery is being performed?  CERVICAL FUSION  When is this surgery scheduled?  TBD  What type of clearance is required (medical clearance vs. Pharmacy clearance to hold med vs. Both)?  BOTH  Are there any medications that need to be held prior to surgery and how long?  [place] name and name of physician performing surgery?  [place] / DR. Arnoldo Morale  What is the office phone number?  [phone]   7.   What is the office fax number?  [phone] ATTN:  NIKKI  8.   Anesthesia type (None, local, MAC, general) ?  GENERAL   Brittany Ford 08/27/2020, 1:22 PM  _________________________________________________________________   (provider comments below)

## 2020-08-31 NOTE — Telephone Encounter (Signed)
Patient with diagnosis of A Fib on ELIQUIS for anticoagulation.    Procedure: CERVICAL FUSION Date of procedure: TBD   CHA2DS2-VASc Score = 5  This indicates a 7.2% annual risk of stroke. The patient's score is based upon: CHF History: No HTN History: Yes Diabetes History: Yes Stroke History: No Vascular Disease History: Yes Age Score: 1 Gender Score: 1    CrCl 77 mL/min Platelet count 332K 04/25/19  Per office protocol, patient can hold Eliquis for 3 days prior to procedure.

## 2020-08-31 NOTE — Telephone Encounter (Signed)
   Primary Cardiologist: [name], MD  Chart reviewed as part of pre-operative protocol coverage. Given past medical history and time since last visit, based on ACC/AHA guidelines, Brittany Ford would be at acceptable risk for the planned procedure without further cardiovascular testing.   Patient with diagnosis of A Fib on ELIQUIS for anticoagulation.     Procedure: CERVICAL FUSION Date of procedure: TBD     CHA2DS2-VASc Score = 5  This indicates a 7.2% annual risk of stroke. The patient's score is based upon: CHF History: No HTN History: Yes Diabetes History: Yes Stroke History: No Vascular Disease History: Yes Age Score: 1 Gender Score: 1      CrCl 77 mL/min Platelet count 332K 04/25/19   Per office protocol, patient can hold Eliquis for 3 days prior to procedure.   I will route this recommendation to the requesting party via Epic fax function and remove from pre-op pool.  Please call with questions.  Thomasene Ripple.  NP-C    08/31/2020, 1:15 PM [facility] [address] Office [phone] Fax [phone]

## 2020-09-07 DIAGNOSIS — R109 Unspecified abdominal pain: Secondary | ICD-10-CM | POA: Diagnosis not present

## 2020-09-07 DIAGNOSIS — R1012 Left upper quadrant pain: Secondary | ICD-10-CM | POA: Diagnosis not present

## 2020-09-07 DIAGNOSIS — D509 Iron deficiency anemia, unspecified: Secondary | ICD-10-CM | POA: Diagnosis not present

## 2020-09-13 ENCOUNTER — Other Ambulatory Visit: Payer: Self-pay | Admitting: Family Medicine

## 2020-09-13 DIAGNOSIS — R1032 Left lower quadrant pain: Secondary | ICD-10-CM

## 2020-09-15 ENCOUNTER — Other Ambulatory Visit: Payer: Medicare Other

## 2020-09-21 ENCOUNTER — Other Ambulatory Visit: Payer: Self-pay

## 2020-09-21 ENCOUNTER — Institutional Professional Consult (permissible substitution): Payer: Medicare Other | Admitting: Cardiovascular Disease

## 2020-09-21 ENCOUNTER — Emergency Department (HOSPITAL_BASED_OUTPATIENT_CLINIC_OR_DEPARTMENT_OTHER)
Admission: EM | Admit: 2020-09-21 | Discharge: 2020-09-21 | Disposition: A | Discharge status: Home / Self Care | Payer: Medicare Other | Attending: Emergency Medicine | Admitting: Emergency Medicine

## 2020-09-21 ENCOUNTER — Emergency Department (HOSPITAL_BASED_OUTPATIENT_CLINIC_OR_DEPARTMENT_OTHER): Payer: Medicare Other | Admitting: Radiology

## 2020-09-21 ENCOUNTER — Encounter (HOSPITAL_BASED_OUTPATIENT_CLINIC_OR_DEPARTMENT_OTHER): Payer: Self-pay

## 2020-09-21 DIAGNOSIS — R109 Unspecified abdominal pain: Secondary | ICD-10-CM | POA: Diagnosis present

## 2020-09-21 DIAGNOSIS — M79601 Pain in right arm: Secondary | ICD-10-CM | POA: Insufficient documentation

## 2020-09-21 DIAGNOSIS — E119 Type 2 diabetes mellitus without complications: Secondary | ICD-10-CM | POA: Insufficient documentation

## 2020-09-21 DIAGNOSIS — Z79899 Other long term (current) drug therapy: Secondary | ICD-10-CM | POA: Insufficient documentation

## 2020-09-21 DIAGNOSIS — K5900 Constipation, unspecified: Secondary | ICD-10-CM | POA: Diagnosis not present

## 2020-09-21 DIAGNOSIS — Z7901 Long term (current) use of anticoagulants: Secondary | ICD-10-CM | POA: Diagnosis not present

## 2020-09-21 DIAGNOSIS — Z87891 Personal history of nicotine dependence: Secondary | ICD-10-CM | POA: Diagnosis not present

## 2020-09-21 DIAGNOSIS — I10 Essential (primary) hypertension: Secondary | ICD-10-CM | POA: Insufficient documentation

## 2020-09-21 DIAGNOSIS — M5412 Radiculopathy, cervical region: Secondary | ICD-10-CM | POA: Diagnosis not present

## 2020-09-21 DIAGNOSIS — Z7984 Long term (current) use of oral hypoglycemic drugs: Secondary | ICD-10-CM | POA: Insufficient documentation

## 2020-09-21 DIAGNOSIS — I480 Paroxysmal atrial fibrillation: Secondary | ICD-10-CM | POA: Diagnosis not present

## 2020-09-21 DIAGNOSIS — E039 Hypothyroidism, unspecified: Secondary | ICD-10-CM | POA: Diagnosis not present

## 2020-09-21 DIAGNOSIS — R1011 Right upper quadrant pain: Secondary | ICD-10-CM | POA: Diagnosis not present

## 2020-09-21 DIAGNOSIS — R1084 Generalized abdominal pain: Secondary | ICD-10-CM

## 2020-09-21 DIAGNOSIS — R110 Nausea: Secondary | ICD-10-CM | POA: Diagnosis not present

## 2020-09-21 LAB — COMPREHENSIVE METABOLIC PANEL
ALT: 9 U/L (ref 0–44)
AST: 13 U/L — ABNORMAL LOW (ref 15–41)
Albumin: 3.7 g/dL (ref 3.5–5.0)
Alkaline Phosphatase: 62 U/L (ref 38–126)
Anion gap: 8 (ref 5–15)
BUN: 12 mg/dL (ref 8–23)
CO2: 24 mmol/L (ref 22–32)
Calcium: 9 mg/dL (ref 8.9–10.3)
Chloride: 104 mmol/L (ref 98–111)
Creatinine, Ser: 0.91 mg/dL (ref 0.44–1.00)
GFR, Estimated: >60 mL/min (ref >60)
Glucose, Bld: 154 mg/dL — ABNORMAL HIGH (ref 70–99)
Potassium: 4 mmol/L (ref 3.5–5.1)
Sodium: 136 mmol/L (ref 135–145)
Total Bilirubin: 0.3 mg/dL (ref 0.3–1.2)
Total Protein: 6.5 g/dL (ref 6.5–8.1)

## 2020-09-21 LAB — URINALYSIS, ROUTINE W REFLEX MICROSCOPIC
Bilirubin Urine: NEGATIVE
Glucose, UA: NEGATIVE mg/dL
Ketones, ur: NEGATIVE mg/dL
Leukocytes,Ua: NEGATIVE
Nitrite: NEGATIVE
Protein, ur: NEGATIVE mg/dL
Specific Gravity, Urine: 1.023 (ref 1.005–1.030)
pH: 5.5 (ref 5.0–8.0)

## 2020-09-21 LAB — CBC WITH DIFFERENTIAL/PLATELET
Abs Immature Granulocytes: 0.02 10*3/uL (ref 0.00–0.07)
Basophils Absolute: 0.1 10*3/uL (ref 0.0–0.1)
Basophils Relative: 1 %
Eosinophils Absolute: 0.2 10*3/uL (ref 0.0–0.5)
Eosinophils Relative: 3 %
HCT: 37.9 % (ref 36.0–46.0)
Hemoglobin: 12 g/dL (ref 12.0–15.0)
Immature Granulocytes: 0 %
Lymphocytes Relative: 18 %
Lymphs Abs: 1.3 10*3/uL (ref 0.7–4.0)
MCH: 26.4 pg (ref 26.0–34.0)
MCHC: 31.7 g/dL (ref 30.0–36.0)
MCV: 83.5 fL (ref 80.0–100.0)
Monocytes Absolute: 0.5 10*3/uL (ref 0.1–1.0)
Monocytes Relative: 8 %
Neutro Abs: 4.8 10*3/uL (ref 1.7–7.7)
Neutrophils Relative %: 70 %
Platelets: 287 10*3/uL (ref 150–400)
RBC: 4.54 MIL/uL (ref 3.87–5.11)
RDW: 14.2 % (ref 11.5–15.5)
WBC: 7 10*3/uL (ref 4.0–10.5)
nRBC: 0 % (ref 0.0–0.2)

## 2020-09-21 LAB — LIPASE, BLOOD: Lipase: 12 U/L (ref 11–51)

## 2020-09-21 MED ORDER — POLYETHYLENE GLYCOL 3350 17 G PO PACK
17.0000 g | PACK | Freq: Every day | ORAL | Status: DC
  Filled 2020-09-21: qty 1

## 2020-09-21 MED ORDER — HYDROCODONE-ACETAMINOPHEN 5-325 MG PO TABS: 1.0000 | ORAL_TABLET | ORAL | 0 refills | Status: DC | PRN

## 2020-09-21 MED ORDER — ONDANSETRON HCL 4 MG/2ML IJ SOLN
4.0000 mg | Freq: Once | INTRAMUSCULAR | Status: AC
Start: 2020-09-21 — End: 2020-09-21
  Administered 2020-09-21: 4 mg via INTRAVENOUS
  Filled 2020-09-21: qty 2

## 2020-09-21 MED ORDER — KETOROLAC TROMETHAMINE 30 MG/ML IJ SOLN
30.0000 mg | Freq: Once | INTRAMUSCULAR | Status: AC
  Administered 2020-09-21: 30 mg via INTRAVENOUS
  Filled 2020-09-21: qty 1

## 2020-09-21 MED ORDER — MORPHINE SULFATE (PF) 4 MG/ML IV SOLN
4.0000 mg | Freq: Once | INTRAVENOUS | Status: AC
Start: 2020-09-21 — End: 2020-09-21
  Administered 2020-09-21: 4 mg via INTRAVENOUS
  Filled 2020-09-21: qty 1

## 2020-09-21 MED ORDER — MORPHINE SULFATE (PF) 4 MG/ML IV SOLN
4.0000 mg | Freq: Once | INTRAVENOUS | Status: AC
  Administered 2020-09-21: 4 mg via INTRAVENOUS
  Filled 2020-09-21: qty 1

## 2020-09-21 NOTE — ED Notes (Signed)
Patient transported to X-ray 

## 2020-09-21 NOTE — Discharge Instructions (Addendum)
Take your Miralax daily as needed for constipation.

## 2020-09-21 NOTE — ED Triage Notes (Signed)
Pt c/o R neck/Shoulder/Arm pain r/t a pinched nerve in her neck and LLQ pain and n/v x "a couple months."  Pt reports she has been seen by PCP for complaints and she is supposed to have surgery for nerve.  Reports taking tramadol for pain w/o relief.  Sts PCP has not addressed abdominal pain.

## 2020-09-21 NOTE — ED Notes (Signed)
States having lots of pain in right arm.  Pain radiates down arm.  C/O pinch nerve.  States also has abdominal pain for about a month.  Since Saturday been having nausea no vomiting.  Abd soft tender left side

## 2020-09-21 NOTE — ED Provider Notes (Signed)
[facility] Provider [facility]   CSN: [id number] Arrival date & time: 09/21/20  1403     History Chief Complaint  Patient presents with   Shoulder Pain   Abdominal Pain   Nausea    Brittany PavlovDianne Ford is a 75 y.o. female.  Pt presents to the ED today with right arm pain and abdominal pain.  Pt has had the right arm pain for about a month.  She has seen Dr. Lovell SheehanJenkins ([place]) and is contemplating surgery.  Pt has been taking tramadol, but that is not helping her pain.  It is just making her constipated.  Pt has not taken any Miralax since last week.  No bm in 3 days.  No f/c. No n/v.      Past Medical History:  Diagnosis Date   Atrial fibrillation (HCC)    Atrial flutter (HCC)    Chest pain    with typical and atypical qualities   Chronic low back pain    Diabetes mellitus without complication (HCC)    Diet Controlled   Dysrhythmia    ATRIAL FIBRILATION   GERD (gastroesophageal reflux disease)    Obesity     Patient Active Problem List   Diagnosis Date Noted   Statin myopathy 07/27/2020   Aortic aneurysm (HCC) 01/21/2020   Body mass index (BMI) 31.0-31.9, adult 01/21/2020   Depression 01/21/2020   Hyperglycemia due to type 2 diabetes mellitus (HCC) 01/21/2020   Hypothyroidism 01/21/2020   Iron deficiency anemia 01/21/2020   Kidney stone 01/21/2020   Left lower quadrant pain 01/21/2020   Obstructive sleep apnea syndrome 01/21/2020   Other specified conditions associated with female genital organs and menstrual cycle 01/21/2020   Pure hypercholesterolemia 01/21/2020   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   Cervical radiculopathy 10/27/2019   Lumbar facet arthropathy 10/27/2019   Dyslipidemia 06/17/2019   Essential hypertension 11/29/2017   Thickened endometrium 02/09/2017   Bilateral lower extremity edema 10/13/2014   Chronic anticoagulation 10/13/2014   Preoperative cardiovascular examination 02/19/2014   Mitral regurgitation 11/26/2012    Peripheral vascular disease (HCC) 11/26/2012   Chronic atrial fibrillation (HCC) 11/14/2012   Cheerleading 02/28/2012   Anemia associated with acute blood loss 02/28/2012   PAF (paroxysmal atrial fibrillation)/atrial flutter    Insomnia, unspecified 03/19/2009   Major depressive disorder, recurrent episode (HCC) 03/19/2009   Unsatisfactory cytologic smear of vagina 03/19/2009   Other and unspecified hyperlipidemia 12/22/2008   Type II or unspecified type diabetes mellitus without mention of complication, uncontrolled 12/22/2008   Anemia, unspecified 11/12/2008   Vitamin D deficiency 11/12/2008   Arthropathy 10/07/2008   Rash and other nonspecific skin eruption 10/07/2008   Urinary tract infection, site not specified 09/18/2008   Asthma 08/14/2008   Esophageal reflux 08/14/2008   DDD (degenerative disc disease), lumbosacral 02/20/2008    Past Surgical History:  Procedure Laterality Date   BREAST EXCISIONAL BIOPSY Right 1990s   benign   BREAST EXCISIONAL BIOPSY Left 1990s   benign   COLONOSCOPY  03/01/2012   Procedure: COLONOSCOPY;  Surgeon: Barrie FolkJohn C Hayes, MD;  [facility];  Service: Endoscopy;  Laterality: N/A;   ESOPHAGOGASTRODUODENOSCOPY  02/29/2012   Procedure: ESOPHAGOGASTRODUODENOSCOPY (EGD);  Surgeon: Barrie FolkJohn C Hayes, MD;  [facility];  Service: Endoscopy;  Laterality: N/A;   FOOT SURGERY     x2   HYSTEROSCOPY WITH D & C N/A 02/09/2017   Procedure: DILATATION AND CURETTAGE /HYSTEROSCOPY;  Surgeon: Gerald Leitzole, Tara, MD;  Location: [facility];  Service: Gynecology;  Laterality: N/A;  w/Myosure for Polyp   TONSILLECTOMY       OB History   No obstetric history on file.     Family History  Problem Relation Age of Onset   COPD Father    Heart attack Father    Lung cancer Father    Breast cancer Mother    Heart attack Brother    Heart disease Sister     Social History   Tobacco Use   Smoking status: Former    Packs/day: 0.50    Years: 20.00    Pack  years: 10.00    Types: Cigarettes    Quit date: 07/20/2005    Years since quitting: 15.1   Smokeless tobacco: Never  Substance Use Topics   Alcohol use: No   Drug use: No    Home Medications Prior to Admission medications   Medication Sig Start Date End Date Taking? Authorizing Provider  Accu-Chek FastClix Lancets MISC See admin instructions. 10/05/17  Yes [provider]  ACCU-CHEK GUIDE test strip  11/21/19  Yes [provider]  Acetaminophen (TYLENOL ARTHRITIS PAIN PO)    Yes [provider]  apixaban (ELIQUIS) 5 MG TABS tablet Take 1 tablet (5 mg total) by mouth 2 (two) times daily. 03/03/19  Yes Lyn Records, MD  Blood Glucose Monitoring Suppl (ACCU-CHEK AVIVA) device by Other route. Use as instructed   Yes [provider]  Cholecalciferol (VITAMIN D) 2000 units CAPS Take 4,000 Units by mouth daily.   Yes [provider]  flecainide (TAMBOCOR) 50 MG tablet TAKE 1 TABLET BY MOUTH TWICE A DAY 06/28/20  Yes Lyn Records, MD  furosemide (LASIX) 20 MG tablet Take 20-40 mg by mouth daily as needed for fluid or edema.  09/14/14  Yes [provider]  glipiZIDE (GLUCOTROL) 5 MG tablet Take 0.5 tablets (2.5 mg total) by mouth daily before breakfast. 07/20/20  Yes Shamleffer, Konrad Dolores, MD  glucose blood test strip Use to check blood sugars 07/12/12  Yes [provider]  HYDROcodone-acetaminophen (NORCO/VICODIN) 5-325 MG tablet Take 1 tablet by mouth every 4 (four) hours as needed. 09/21/20  Yes Jacalyn Lefevre, MD  magnesium oxide (MAG-OX) 400 MG tablet Take 400 mg by mouth daily.   Yes [provider]  melatonin 3 MG TABS tablet Take 3 mg by mouth at bedtime.   Yes [provider]  metFORMIN (GLUCOPHAGE) 500 MG tablet Take 250 mg by mouth 2 (two) times daily. 05/27/19  Yes [provider]  metoprolol tartrate (LOPRESSOR) 25 MG tablet Take 1 tablet (25 mg total) by mouth 2 (two) times daily. Please keep  upcoming appointment for future refills. Thank you 07/12/20  Yes Lyn Records, MD  Multiple Vitamin (MULTIVITAMIN WITH MINERALS) TABS tablet Take 1 tablet by mouth daily.   Yes [provider]  traMADol (ULTRAM) 50 MG tablet Take 50 mg by mouth at bedtime as needed for moderate pain.    Yes [provider]  Zinc 50 MG TABS 1 tablet   Yes [provider]  predniSONE (STERAPRED UNI-PAK 21 TAB) 10 MG (21) TBPK tablet See admin instructions. Patient not taking: Reported on 09/21/2020 01/20/20   [provider]    Allergies    Bee venom, Influenza virus vaccine, Atorvastatin, Crestor [rosuvastatin], Hydrocodone bit-homatrop mbr, Other, and Xarelto [rivaroxaban]  Review of Systems   Review of Systems  Gastrointestinal:  Positive for abdominal pain.  Musculoskeletal:        Right arm pain  All  other systems reviewed and are negative.  Physical Exam Updated Vital Signs BP 139/76 (BP Location: Left Arm)   Pulse 66   Temp 98 F (36.7 C) (Oral)   Resp 16   Ht 5\' 11"  (1.803 m)   Wt 83.9 kg   SpO2 98%   BMI 25.80 kg/m   Physical Exam Vitals and nursing note reviewed.  Constitutional:      Appearance: She is well-developed.  HENT:     Head: Normocephalic and atraumatic.     Mouth/Throat:     Mouth: Mucous membranes are moist.  Eyes:     Extraocular Movements: Extraocular movements intact.  Cardiovascular:     Rate and Rhythm: Normal rate and regular rhythm.  Pulmonary:     Effort: Pulmonary effort is normal.     Breath sounds: Normal breath sounds.  Abdominal:     General: Abdomen is flat. Bowel sounds are decreased.     Palpations: Abdomen is soft.     Tenderness: There is abdominal tenderness in the left lower quadrant.  Skin:    General: Skin is warm.     Capillary Refill: Capillary refill takes less than 2 seconds.  Neurological:     Mental Status: She is alert and oriented to person, place, and time.     Comments: Right arm slightly  weak.  Numb in index finger.  Psychiatric:        Mood and Affect: Mood normal.        Behavior: Behavior normal.    ED Results / Procedures / Treatments   Labs (all labs ordered are listed, but only abnormal results are displayed) Labs Reviewed  COMPREHENSIVE METABOLIC PANEL - Abnormal; Notable for the following components:      Result Value   Glucose, Bld 154 (*)    AST 13 (*)    All other components within normal limits  URINALYSIS, ROUTINE W REFLEX MICROSCOPIC - Abnormal; Notable for the following components:   Hgb urine dipstick TRACE (*)    All other components within normal limits  LIPASE, BLOOD  CBC WITH DIFFERENTIAL/PLATELET    EKG None  Radiology DG Abdomen Acute W/Chest  Result Date: 09/21/2020 CLINICAL DATA:  Constipation EXAM: DG ABDOMEN ACUTE WITH 1 VIEW CHEST COMPARISON:  CT abdomen/pelvis 10/10/2016 FINDINGS: Chest: The cardiomediastinal silhouette is within normal limits. There is no focal consolidation or pulmonary edema. There is no pleural effusion or pneumothorax. There is right apical pleural scarring. Abdomen: There is a nonobstructive bowel gas pattern. There is a moderate colonic stool burden. There is no free intraperitoneal air. There is no gross organomegaly. Calcifications projecting over the pelvis likely reflect fibroids as seen on the prior CT abdomen/pelvis. The bones are unremarkable. IMPRESSION: Nonobstructive bowel gas pattern.  Moderate colonic stool burden. Electronically Signed   By: 12/10/2016 M.D.   On: 09/21/2020 16:02    Procedures Procedures   Medications Ordered in ED Medications  polyethylene glycol (MIRALAX / GLYCOLAX) packet 17 g (has no administration in time range)  morphine 4 MG/ML injection 4 mg (4 mg Intravenous Given 09/21/20 1525)  ondansetron (ZOFRAN) injection 4 mg (4 mg Intravenous Given 09/21/20 1524)  morphine 4 MG/ML injection 4 mg (4 mg Intravenous Given 09/21/20 1720)  ketorolac (TORADOL) 30 MG/ML injection 30 mg (30  mg Intravenous Given 09/21/20 1720)    ED Course  I have reviewed the triage vital signs and the nursing notes.  Pertinent labs & imaging results that were available during my care of  the patient were reviewed by me and considered in my medical decision making (see chart for details).    MDM Rules/Calculators/A&P                           Pt's pain has improved.  She is able to ambulate.  She is constipated and encouraged to use her miralax daily as needed for constipation.  She is to f/u with her pcp.  Return if worse. Final Clinical Impression(s) / ED Diagnoses Final diagnoses:  Cervical radiculopathy  Generalized abdominal pain  Constipation, unspecified constipation type    Rx / DC Orders ED Discharge Orders          Ordered    HYDROcodone-acetaminophen (NORCO/VICODIN) 5-325 MG tablet  Every 4 hours PRN        09/21/20 1714             Jacalyn Lefevre, MD 09/21/20 1729

## 2020-09-23 ENCOUNTER — Telehealth: Payer: Self-pay | Admitting: *Deleted

## 2020-09-23 ENCOUNTER — Telehealth: Payer: Self-pay | Admitting: Interventional Cardiology

## 2020-09-23 DIAGNOSIS — M5412 Radiculopathy, cervical region: Secondary | ICD-10-CM | POA: Diagnosis not present

## 2020-09-23 NOTE — Telephone Encounter (Signed)
Patient with diagnosis of A Fib on Eliquis for anticoagulation.    Procedure: CERVICAL EPIDURAL STEROID INJECTION Date of procedure: TBD   CHA2DS2-VASc Score = 5  This indicates a 7.2% annual risk of stroke. The patient's score is based upon: CHF History: No HTN History: Yes Diabetes History: Yes Stroke History: No Vascular Disease History: Yes Age Score: 1 Gender Score: 1    CrCl 75 mL/min Platelet count 287K  Per office protocol, patient can hold Eliquis for 3 days prior to procedure.

## 2020-09-23 NOTE — Telephone Encounter (Signed)
   [facility] Pre-operative Risk Assessment    Patient Name: Brittany Ford  DOB: 06/23/1945 MRN: [id number]  HEARTCARE STAFF:  - IMPORTANT!!!!!! Under Visit Info/Reason for Call, type in Other and utilize the format Clearance MM/DD/YY or Clearance TBD. Do not use dashes or single digits. - Please review there is not already an duplicate clearance open for this procedure. - If request is for dental extraction, please clarify the # of teeth to be extracted. - If the patient is currently at the dentist's office, call Pre-Op Callback Staff (MA/nurse) to input urgent request.  - If the patient is not currently in the dentist office, please route to the Pre-Op pool.  Request for surgical clearance:  What type of surgery is being performed?  CERVICAL EPIDURAL STEROID INJECTION  When is this surgery scheduled?  TBD  What type of clearance is required (medical clearance vs. Pharmacy clearance to hold med vs. Both)?  BOTH  Are there any medications that need to be held prior to surgery and how long?  [place] name and name of physician performing surgery?  [facility] / DR. IBAZABO  What is the office phone number?  [phone]   7.   What is the office fax number?  [phone]  ATTN:  [email or web address]   8.   Anesthesia type (None, local, MAC, general) ?    Jeanann Lewandowsky 09/23/2020, 2:15 PM  _________________________________________________________________   (provider comments below)

## 2020-09-23 NOTE — Telephone Encounter (Signed)
  Pt c/o medication issue:  1. Name of Medication: eliquis   2. How are you currently taking this medication (dosage and times per day)?   3. Are you having a reaction (difficulty breathing--STAT)?   4. What is your medication issue?  Pt would like to speak with Dr. Michaelle Copas nurse

## 2020-09-23 NOTE — Telephone Encounter (Signed)
Lyn Records, MD  Julio Sicks, RN Dr. Romero Belling requested clearance for spinal injection.  I gave her the go ahead to hold her anticoagulation for 48 to 72 hours.

## 2020-09-24 NOTE — Telephone Encounter (Signed)
   Primary Cardiologist: [name], MD  Chart reviewed as part of pre-operative protocol coverage. Given past medical history and time since last visit, based on ACC/AHA guidelines, Alieah Brinton would be at acceptable risk for the planned procedure without further cardiovascular testing.   Patient with diagnosis of A Fib on Eliquis for anticoagulation.     Procedure: CERVICAL EPIDURAL STEROID INJECTION Date of procedure: TBD     CHA2DS2-VASc Score = 5  This indicates a 7.2% annual risk of stroke. The patient's score is based upon: CHF History: No HTN History: Yes Diabetes History: Yes Stroke History: No Vascular Disease History: Yes Age Score: 1 Gender Score: 1     CrCl 75 mL/min Platelet count 287K   Per office protocol, patient can hold Eliquis for 3 days prior to procedure.  I will route this recommendation to the requesting party via Epic fax function and remove from pre-op pool.  Please call with questions.  Thomasene Ripple.  NP-C    09/24/2020, 8:11 AM [facility] [address] Office [phone] Fax [phone]

## 2020-09-24 NOTE — Telephone Encounter (Signed)
Spoke with Brittany Ford and apologized that she did not get a call back yesterday.  Brittany Ford was ok.  She was calling about holding her Eliquis for an injection but states she already heard from Dr. Hollice Espy office and had the instructions about holding it.  Brittany Ford appreciative for call.

## 2020-09-27 ENCOUNTER — Other Ambulatory Visit: Payer: Self-pay | Admitting: Interventional Cardiology

## 2020-09-27 DIAGNOSIS — M5412 Radiculopathy, cervical region: Secondary | ICD-10-CM | POA: Diagnosis not present

## 2020-09-27 DIAGNOSIS — M4802 Spinal stenosis, cervical region: Secondary | ICD-10-CM | POA: Diagnosis not present

## 2020-10-05 ENCOUNTER — Other Ambulatory Visit: Payer: Medicare Other

## 2020-10-12 DIAGNOSIS — M5412 Radiculopathy, cervical region: Secondary | ICD-10-CM | POA: Diagnosis not present

## 2020-10-12 DIAGNOSIS — M4802 Spinal stenosis, cervical region: Secondary | ICD-10-CM | POA: Diagnosis not present

## 2020-10-18 ENCOUNTER — Other Ambulatory Visit: Payer: Self-pay | Admitting: Interventional Cardiology

## 2020-11-01 DIAGNOSIS — M4802 Spinal stenosis, cervical region: Secondary | ICD-10-CM | POA: Diagnosis not present

## 2020-11-01 DIAGNOSIS — M5136 Other intervertebral disc degeneration, lumbar region: Secondary | ICD-10-CM | POA: Diagnosis not present

## 2020-11-01 DIAGNOSIS — M47816 Spondylosis without myelopathy or radiculopathy, lumbar region: Secondary | ICD-10-CM | POA: Diagnosis not present

## 2020-11-01 DIAGNOSIS — M5412 Radiculopathy, cervical region: Secondary | ICD-10-CM | POA: Diagnosis not present

## 2020-11-01 DIAGNOSIS — M5030 Other cervical disc degeneration, unspecified cervical region: Secondary | ICD-10-CM | POA: Diagnosis not present

## 2020-11-11 DIAGNOSIS — Z01818 Encounter for other preprocedural examination: Secondary | ICD-10-CM | POA: Diagnosis not present

## 2020-11-11 DIAGNOSIS — M5010 Cervical disc disorder with radiculopathy, unspecified cervical region: Secondary | ICD-10-CM | POA: Diagnosis not present

## 2020-11-11 DIAGNOSIS — M542 Cervicalgia: Secondary | ICD-10-CM | POA: Diagnosis not present

## 2020-11-11 DIAGNOSIS — Z01810 Encounter for preprocedural cardiovascular examination: Secondary | ICD-10-CM | POA: Diagnosis not present

## 2020-11-11 DIAGNOSIS — Z79899 Other long term (current) drug therapy: Secondary | ICD-10-CM | POA: Diagnosis not present

## 2020-11-11 DIAGNOSIS — M4802 Spinal stenosis, cervical region: Secondary | ICD-10-CM | POA: Diagnosis not present

## 2020-11-11 DIAGNOSIS — I480 Paroxysmal atrial fibrillation: Secondary | ICD-10-CM | POA: Diagnosis not present

## 2020-11-11 DIAGNOSIS — I739 Peripheral vascular disease, unspecified: Secondary | ICD-10-CM | POA: Diagnosis not present

## 2020-11-11 DIAGNOSIS — G4733 Obstructive sleep apnea (adult) (pediatric): Secondary | ICD-10-CM | POA: Diagnosis not present

## 2020-11-11 DIAGNOSIS — Z01812 Encounter for preprocedural laboratory examination: Secondary | ICD-10-CM | POA: Diagnosis not present

## 2020-11-11 DIAGNOSIS — E119 Type 2 diabetes mellitus without complications: Secondary | ICD-10-CM | POA: Diagnosis not present

## 2020-11-24 DIAGNOSIS — M5136 Other intervertebral disc degeneration, lumbar region: Secondary | ICD-10-CM | POA: Diagnosis not present

## 2020-11-24 DIAGNOSIS — G4730 Sleep apnea, unspecified: Secondary | ICD-10-CM | POA: Diagnosis not present

## 2020-11-24 DIAGNOSIS — Z888 Allergy status to other drugs, medicaments and biological substances status: Secondary | ICD-10-CM | POA: Diagnosis not present

## 2020-11-24 DIAGNOSIS — M5030 Other cervical disc degeneration, unspecified cervical region: Secondary | ICD-10-CM | POA: Diagnosis not present

## 2020-11-24 DIAGNOSIS — E119 Type 2 diabetes mellitus without complications: Secondary | ICD-10-CM | POA: Diagnosis not present

## 2020-11-24 DIAGNOSIS — M5412 Radiculopathy, cervical region: Secondary | ICD-10-CM | POA: Diagnosis not present

## 2020-11-24 DIAGNOSIS — K219 Gastro-esophageal reflux disease without esophagitis: Secondary | ICD-10-CM | POA: Diagnosis not present

## 2020-11-24 DIAGNOSIS — M438X2 Other specified deforming dorsopathies, cervical region: Secondary | ICD-10-CM | POA: Diagnosis not present

## 2020-11-24 DIAGNOSIS — I4891 Unspecified atrial fibrillation: Secondary | ICD-10-CM | POA: Diagnosis not present

## 2020-11-24 DIAGNOSIS — M2578 Osteophyte, vertebrae: Secondary | ICD-10-CM | POA: Diagnosis not present

## 2020-11-24 DIAGNOSIS — Z91030 Bee allergy status: Secondary | ICD-10-CM | POA: Diagnosis not present

## 2020-11-24 DIAGNOSIS — E785 Hyperlipidemia, unspecified: Secondary | ICD-10-CM | POA: Diagnosis not present

## 2020-11-24 DIAGNOSIS — I10 Essential (primary) hypertension: Secondary | ICD-10-CM | POA: Diagnosis not present

## 2020-11-24 DIAGNOSIS — Z7984 Long term (current) use of oral hypoglycemic drugs: Secondary | ICD-10-CM | POA: Diagnosis not present

## 2020-11-24 DIAGNOSIS — E7849 Other hyperlipidemia: Secondary | ICD-10-CM | POA: Diagnosis not present

## 2020-11-24 DIAGNOSIS — J45909 Unspecified asthma, uncomplicated: Secondary | ICD-10-CM | POA: Diagnosis not present

## 2020-11-24 DIAGNOSIS — Z7901 Long term (current) use of anticoagulants: Secondary | ICD-10-CM | POA: Diagnosis not present

## 2020-11-24 DIAGNOSIS — Z887 Allergy status to serum and vaccine status: Secondary | ICD-10-CM | POA: Diagnosis not present

## 2020-11-24 DIAGNOSIS — Z885 Allergy status to narcotic agent status: Secondary | ICD-10-CM | POA: Diagnosis not present

## 2020-11-24 DIAGNOSIS — R21 Rash and other nonspecific skin eruption: Secondary | ICD-10-CM | POA: Diagnosis not present

## 2020-11-24 DIAGNOSIS — I2510 Atherosclerotic heart disease of native coronary artery without angina pectoris: Secondary | ICD-10-CM | POA: Diagnosis not present

## 2020-11-30 ENCOUNTER — Institutional Professional Consult (permissible substitution): Payer: Medicare Other | Admitting: Cardiovascular Disease

## 2020-12-15 DIAGNOSIS — Z981 Arthrodesis status: Secondary | ICD-10-CM | POA: Diagnosis not present

## 2020-12-15 DIAGNOSIS — M5011 Cervical disc disorder with radiculopathy,  high cervical region: Secondary | ICD-10-CM | POA: Diagnosis not present

## 2021-01-04 ENCOUNTER — Ambulatory Visit: Payer: Medicare Other | Admitting: Cardiovascular Disease

## 2021-01-04 ENCOUNTER — Encounter: Payer: Self-pay | Admitting: Cardiovascular Disease

## 2021-01-04 ENCOUNTER — Other Ambulatory Visit: Payer: Self-pay

## 2021-01-04 VITALS — BP 134/68 | HR 60 | Ht 71.0 in | Wt 187.0 lb

## 2021-01-04 DIAGNOSIS — E785 Hyperlipidemia, unspecified: Secondary | ICD-10-CM | POA: Diagnosis not present

## 2021-01-04 DIAGNOSIS — I739 Peripheral vascular disease, unspecified: Secondary | ICD-10-CM

## 2021-01-04 DIAGNOSIS — I480 Paroxysmal atrial fibrillation: Secondary | ICD-10-CM | POA: Diagnosis not present

## 2021-01-04 NOTE — Progress Notes (Signed)
Cardiology Office Note   Date:  01/04/2021   ID:  Brittany Ford, DOB 11-Mar-1945, MRN [id number]  PCP:  Sigmund Hazel, MD  Cardiologist:  Dr. Katrinka Blazing  Chief Complaint  Patient presents with   Follow-up      History of Present Illness: Brittany Ford is a 75 y.o. female who was referred by Dr. Katrinka Blazing for evaluation and management of peripheral arterial disease.  She has history of paroxysmal atrial fibrillation on flecainide, type 2 diabetes, hyperlipidemia with poor tolerance to statins, chronic back pain, GERD and obesity.  She is not a smoker. She complains mostly of cold feet and numbness but no significant pain in her lower extremities.  Her walking ability is very limited by chronic low back pain due to spine disease and multiple previous surgeries. She denies chest pain or shortness of breath.  She underwent lower extremity arterial Doppler in June which showed an ABI of 0.84 on the right and 0.65 on the left.  Duplex showed moderate SFA disease on the right side and significant proximal/mid SFA disease on the left.   Past Medical History:  Diagnosis Date   Atrial fibrillation (HCC)    Atrial flutter (HCC)    Chest pain    with typical and atypical qualities   Chronic low back pain    Diabetes mellitus without complication (HCC)    Diet Controlled   Dysrhythmia    ATRIAL FIBRILATION   GERD (gastroesophageal reflux disease)    Obesity     Past Surgical History:  Procedure Laterality Date   BREAST EXCISIONAL BIOPSY Right 1990s   benign   BREAST EXCISIONAL BIOPSY Left 1990s   benign   COLONOSCOPY  03/01/2012   Procedure: COLONOSCOPY;  Surgeon: [name], MD;  Location: [facility] ENDOSCOPY;  Service: Endoscopy;  Laterality: N/A;   ESOPHAGOGASTRODUODENOSCOPY  02/29/2012   Procedure: ESOPHAGOGASTRODUODENOSCOPY (EGD);  Surgeon: [name], MD;  Location: [facility] ENDOSCOPY;  Service: Endoscopy;  Laterality: N/A;   FOOT SURGERY     x2   HYSTEROSCOPY WITH D & C N/A  02/09/2017   Procedure: DILATATION AND CURETTAGE /HYSTEROSCOPY;  Surgeon: Gerald Leitz, MD;  Location: [facility] ORS;  Service: Gynecology;  Laterality: N/A;  w/Myosure for Polyp   TONSILLECTOMY       Current Outpatient Medications  Medication Sig Dispense Refill   Accu-Chek FastClix Lancets MISC See admin instructions.     ACCU-CHEK GUIDE test strip      Acetaminophen (TYLENOL ARTHRITIS PAIN PO)      apixaban (ELIQUIS) 5 MG TABS tablet Take 1 tablet (5 mg total) by mouth 2 (two) times daily. 180 tablet 3   Blood Glucose Monitoring Suppl (ACCU-CHEK AVIVA) device by Other route. Use as instructed     Cholecalciferol (VITAMIN D) 2000 units CAPS Take 4,000 Units by mouth daily.     flecainide (TAMBOCOR) 50 MG tablet TAKE 1 TABLET BY MOUTH TWICE A DAY 180 tablet 2   furosemide (LASIX) 20 MG tablet Take 20-40 mg by mouth daily as needed for fluid or edema.   0   glipiZIDE (GLUCOTROL) 5 MG tablet Take 0.5 tablets (2.5 mg total) by mouth daily before breakfast. 45 tablet 1   glucose blood test strip Use to check blood sugars     HYDROcodone-acetaminophen (NORCO/VICODIN) 5-325 MG tablet Take 1 tablet by mouth every 4 (four) hours as needed. 10 tablet 0   magnesium oxide (MAG-OX) 400 MG tablet Take 400 mg by mouth daily.     melatonin  3 MG TABS tablet Take 3 mg by mouth at bedtime.     metFORMIN (GLUCOPHAGE) 500 MG tablet Take 250 mg by mouth 2 (two) times daily.     metoprolol tartrate (LOPRESSOR) 25 MG tablet TAKE 1 TABLET BY MOUTH 2 TIMES DAILY. PLEASE KEEP UPCOMING APPOINTMENT FOR FUTURE REFILLS. THANK YOU 180 tablet 2   Multiple Vitamin (MULTIVITAMIN WITH MINERALS) TABS tablet Take 1 tablet by mouth daily.     predniSONE (STERAPRED UNI-PAK 21 TAB) 10 MG (21) TBPK tablet See admin instructions.     traMADol (ULTRAM) 50 MG tablet Take 50 mg by mouth at bedtime as needed for moderate pain.      Zinc 50 MG TABS 1 tablet     No current facility-administered medications for this visit.    Allergies:   Bee  venom, Influenza virus vaccine, Atorvastatin, Crestor [rosuvastatin], Hydrocodone bit-homatrop mbr, Other, and Xarelto [rivaroxaban]    Social History:  The patient  reports that she quit smoking about 15 years ago. Her smoking use included cigarettes. She has a 10.00 pack-year smoking history. She has never used smokeless tobacco. She reports that she does not drink alcohol and does not use drugs.   Family History:  The patient's family history includes Breast cancer in her mother; COPD in her father; Heart attack in her brother and father; Heart disease in her sister; Lung cancer in her father.    ROS:  Please see the history of present illness.   Otherwise, review of systems are positive for none.   All other systems are reviewed and negative.    PHYSICAL EXAM: VS:  BP 134/68 (BP Location: Left Arm, Patient Position: Sitting, Cuff Size: Normal)   Pulse 60   Ht 5\' 11"  (1.803 m)   Wt 187 lb (84.8 kg)   BMI 26.08 kg/m  , BMI Body mass index is 26.08 kg/m. GEN: Well nourished, well developed, in no acute distress  HEENT: normal  Neck: no JVD, carotid bruits, or masses Cardiac: RRR; no murmurs, rubs, or gallops,no edema  Respiratory:  clear to auscultation bilaterally, normal work of breathing GI: soft, nontender, nondistended, + BS MS: no deformity or atrophy  Skin: warm and dry, no rash Neuro:  Strength and sensation are intact Psych: euthymic mood, full affect Vascular: Femoral pulse: +2 bilaterally.  Distal pulses are not palpable.   EKG:  EKG is not ordered today.    Recent Labs: 09/21/2020: ALT 9; BUN 12; Creatinine, Ser 0.91; Hemoglobin 12.0; Platelets 287; Potassium 4.0; Sodium 136    Lipid Panel    Component Value Date/Time   CHOL 210 (H) 04/22/2013 2306   TRIG 200 (H) 04/22/2013 2306   HDL 32 (L) 04/22/2013 2306   CHOLHDL 6.6 04/22/2013 2306   VLDL 40 04/22/2013 2306   LDLCALC 138 (H) 04/22/2013 2306      Wt Readings from Last 3 Encounters:  01/04/21 187 lb  (84.8 kg)  09/21/20 185 lb (83.9 kg)  07/16/20 192 lb (87.1 kg)        No flowsheet data found.    ASSESSMENT AND PLAN:  1.  Peripheral arterial disease: Mild to moderately reduced ABI with evidence of SFA disease worse on the left side.  I discussed with her the natural history and management of peripheral arterial disease.  At the present time, no evidence of critical limb ischemia.  She is mostly concerned about cold sensation in her feet which is not specific and might not necessarily improve with revascularization.  She does not seem to be having significant claudication likely due to limited walking ability related to her chronic back pain. I favor that we start with a walking exercise program and if there is no improvement, angiography and revascularization can be considered.  I provided her with written instructions for exercise program.  Reevaluate symptoms in 6 months.  2.  Paroxysmal atrial fibrillation: She is on long-term anticoagulation with Eliquis.  3.  Hyperlipidemia: Her LDL is usually close to 150 and she reports intolerance to rosuvastatin and atorvastatin.  I discussed with her the importance of treating her hyperlipidemia given her recent diagnosis of peripheral arterial disease and her known history of diabetes mellitus.  I offered a trial of PCSK9 inhibitors but the patient is adamant about not trying any cholesterol medications.    Disposition:   FU with me in 6 months  Signed,  Lorine Bears, MD  01/04/2021 11:08 AM    [facility]

## 2021-01-04 NOTE — Patient Instructions (Signed)
Medication Instructions:  Your physician recommends that you continue on your current medications as directed. Please refer to the Current Medication list given to you today.  Follow-Up: At [facility], you and your health needs are our priority.  As part of our continuing mission to provide you with exceptional heart care, we have created designated Provider Care Teams.  These Care Teams include your primary Cardiologist (physician) and Advanced Practice Providers (APPs -  Physician Assistants and Nurse Practitioners) who all work together to provide you with the care you need, when you need it.  We recommend signing up for the patient portal called "MyChart".  Sign up information is provided on this After Visit Summary.  MyChart is used to connect with patients for Virtual Visits (Telemedicine).  Patients are able to view lab/test results, encounter notes, upcoming appointments, etc.  Non-urgent messages can be sent to your provider as well.   To learn more about what you can do with MyChart, go to [email or web address].    Your next appointment:   6 month(s)  The format for your next appointment:   In Person  Provider:   Dr. Kirke Corin   EXERCISE PROGRAM FOR INDIVIDUALS WITH  PERIPHERAL ARTERIAL DISEASE (PAD)   General Information:   Research in vascular exercise has demonstrated remarkable improvement in symptoms of leg pain (claudication) without expensive or invasive interventions. Regular walking programs are extremely helpful for patients with PAD and intermittent claudication.  These steps are designed to help you get started with a safe and effective program to help you walk farther with less pain:   Walk at least three times a week (preferably every day).  Your goal is to build up to 30-45 minutes of total walking time (not counting rest breaks). It may take you several weeks to build up your exercise time starting at 5-10 minutes or whatever you can tolerate.  Walk as far as  possible using moderate to maximal pain (7-8 on the scale below) as a signal to stop, and resume walking when the pain goes away.  On a treadmill, set the speed and grade at a level that brings on the claudication pain within 3 to 5 minutes. Walk at this rate until you experience claudication of moderate severity, rest until the pain improves, and then resume walking.  Over time, you will be able to walk longer at the designated speed and grade; workload should then be increased until you develop the pain within 3 to 5 minutes once again.  This regimen will induce a significant benefit. Studies have demonstrated that participants may be able to walk up to three or four times farther and have less leg pain, within twelve weeks, by following this protocol.  Pain Scale    0_____1_____2_____3_____4_____5_____6_____7_____8_____9_____10   No Pain                                   Moderate Pain                               Maximal Pain

## 2021-01-18 ENCOUNTER — Other Ambulatory Visit: Payer: Self-pay | Admitting: Internal Medicine

## 2021-01-24 ENCOUNTER — Telehealth: Payer: Self-pay | Admitting: Cardiovascular Disease

## 2021-01-24 ENCOUNTER — Other Ambulatory Visit: Payer: Self-pay

## 2021-01-24 ENCOUNTER — Encounter (HOSPITAL_BASED_OUTPATIENT_CLINIC_OR_DEPARTMENT_OTHER): Payer: Self-pay

## 2021-01-24 DIAGNOSIS — M79662 Pain in left lower leg: Secondary | ICD-10-CM | POA: Diagnosis not present

## 2021-01-24 DIAGNOSIS — I480 Paroxysmal atrial fibrillation: Secondary | ICD-10-CM | POA: Diagnosis not present

## 2021-01-24 DIAGNOSIS — Z7901 Long term (current) use of anticoagulants: Secondary | ICD-10-CM | POA: Insufficient documentation

## 2021-01-24 DIAGNOSIS — I10 Essential (primary) hypertension: Secondary | ICD-10-CM | POA: Diagnosis not present

## 2021-01-24 DIAGNOSIS — E119 Type 2 diabetes mellitus without complications: Secondary | ICD-10-CM | POA: Diagnosis not present

## 2021-01-24 DIAGNOSIS — M79605 Pain in left leg: Secondary | ICD-10-CM | POA: Diagnosis not present

## 2021-01-24 DIAGNOSIS — E039 Hypothyroidism, unspecified: Secondary | ICD-10-CM | POA: Diagnosis not present

## 2021-01-24 DIAGNOSIS — J45909 Unspecified asthma, uncomplicated: Secondary | ICD-10-CM | POA: Diagnosis not present

## 2021-01-24 DIAGNOSIS — Z79899 Other long term (current) drug therapy: Secondary | ICD-10-CM | POA: Insufficient documentation

## 2021-01-24 DIAGNOSIS — Z7984 Long term (current) use of oral hypoglycemic drugs: Secondary | ICD-10-CM | POA: Diagnosis not present

## 2021-01-24 NOTE — ED Triage Notes (Signed)
Patient here POV from Home with Leg Pain.  Patient states since Thursday she has been having Pain radiating from Left Hip to Left Lower Leg. Worsening since Thursday. No Acute Trauma or Injury.  NAD Noted during Triage. BIB Wheelchair. A&Ox4. GCS 15.

## 2021-01-24 NOTE — Telephone Encounter (Signed)
Patient states she has excruciating pain in her left leg that started Thursday morning at 3 am and has been constant. She states her leg goes out when she tries to walk. She says she took tylenol and had 3 left over oxycodone, but they have not helped at all.

## 2021-01-24 NOTE — Telephone Encounter (Signed)
Regarding phone call to patient, she also reported that he left knee gave out on her and she needs to use her cane. I recommended that she continue to use the cane for support to help prevent her from falling.

## 2021-01-24 NOTE — Telephone Encounter (Signed)
Spoke with patient regarding her leg pain. She reported that this past Thursday, she woke up with excruciating leg pain. On a 0/10 pain scale, she rated it at "15" from the thigh to her ankle, with the ankle being more painful. She had oxycodone at home and took them without any relief. She is unable to determine if her left leg is swollen compared to her rt leg. She reported that her left leg is cold because of poor circulation. She stated she wanted to go to the ER. I recommended for her to go to the ER to get help with the pain and to have her leg evaluated. She said, "goodbye" and disconnected the call.

## 2021-01-24 NOTE — ED Notes (Addendum)
Tried calling Pt from the waiting room to update vitals and could not locate Pt.

## 2021-01-25 ENCOUNTER — Emergency Department (HOSPITAL_BASED_OUTPATIENT_CLINIC_OR_DEPARTMENT_OTHER)
Admission: EM | Admit: 2021-01-25 | Discharge: 2021-01-25 | Disposition: A | Discharge status: Home / Self Care | Payer: Medicare Other | Attending: Emergency Medicine | Admitting: Emergency Medicine

## 2021-01-25 ENCOUNTER — Emergency Department (HOSPITAL_BASED_OUTPATIENT_CLINIC_OR_DEPARTMENT_OTHER): Payer: Medicare Other

## 2021-01-25 ENCOUNTER — Encounter (HOSPITAL_BASED_OUTPATIENT_CLINIC_OR_DEPARTMENT_OTHER): Payer: Self-pay | Admitting: *Deleted

## 2021-01-25 ENCOUNTER — Emergency Department (HOSPITAL_BASED_OUTPATIENT_CLINIC_OR_DEPARTMENT_OTHER): Payer: Medicare Other | Admitting: Radiology

## 2021-01-25 ENCOUNTER — Emergency Department (HOSPITAL_BASED_OUTPATIENT_CLINIC_OR_DEPARTMENT_OTHER)
Admission: EM | Admit: 2021-01-25 | Discharge: 2021-01-25 | Disposition: A | Discharge status: Home / Self Care | Payer: Medicare Other | Source: Home / Self Care | Attending: Emergency Medicine | Admitting: Emergency Medicine

## 2021-01-25 DIAGNOSIS — J45909 Unspecified asthma, uncomplicated: Secondary | ICD-10-CM | POA: Insufficient documentation

## 2021-01-25 DIAGNOSIS — M25552 Pain in left hip: Secondary | ICD-10-CM | POA: Diagnosis not present

## 2021-01-25 DIAGNOSIS — I480 Paroxysmal atrial fibrillation: Secondary | ICD-10-CM | POA: Insufficient documentation

## 2021-01-25 DIAGNOSIS — Z7901 Long term (current) use of anticoagulants: Secondary | ICD-10-CM | POA: Insufficient documentation

## 2021-01-25 DIAGNOSIS — I10 Essential (primary) hypertension: Secondary | ICD-10-CM | POA: Insufficient documentation

## 2021-01-25 DIAGNOSIS — M79605 Pain in left leg: Secondary | ICD-10-CM

## 2021-01-25 DIAGNOSIS — E119 Type 2 diabetes mellitus without complications: Secondary | ICD-10-CM | POA: Insufficient documentation

## 2021-01-25 DIAGNOSIS — E039 Hypothyroidism, unspecified: Secondary | ICD-10-CM | POA: Insufficient documentation

## 2021-01-25 DIAGNOSIS — Z79899 Other long term (current) drug therapy: Secondary | ICD-10-CM | POA: Insufficient documentation

## 2021-01-25 DIAGNOSIS — Z7984 Long term (current) use of oral hypoglycemic drugs: Secondary | ICD-10-CM | POA: Insufficient documentation

## 2021-01-25 DIAGNOSIS — M79662 Pain in left lower leg: Secondary | ICD-10-CM | POA: Diagnosis not present

## 2021-01-25 LAB — COMPREHENSIVE METABOLIC PANEL
ALT: 6 U/L (ref 0–44)
AST: 13 U/L — ABNORMAL LOW (ref 15–41)
Albumin: 4 g/dL (ref 3.5–5.0)
Alkaline Phosphatase: 75 U/L (ref 38–126)
Anion gap: 8 (ref 5–15)
BUN: 13 mg/dL (ref 8–23)
CO2: 25 mmol/L (ref 22–32)
Calcium: 9.1 mg/dL (ref 8.9–10.3)
Chloride: 105 mmol/L (ref 98–111)
Creatinine, Ser: 0.95 mg/dL (ref 0.44–1.00)
GFR, Estimated: >60 mL/min (ref >60)
Glucose, Bld: 72 mg/dL (ref 70–99)
Potassium: 3.8 mmol/L (ref 3.5–5.1)
Sodium: 138 mmol/L (ref 135–145)
Total Bilirubin: 0.3 mg/dL (ref 0.3–1.2)
Total Protein: 7 g/dL (ref 6.5–8.1)

## 2021-01-25 LAB — C-REACTIVE PROTEIN: CRP: 0.7 mg/dL (ref <1.0)

## 2021-01-25 LAB — CBC WITH DIFFERENTIAL/PLATELET
Abs Immature Granulocytes: 0.02 10*3/uL (ref 0.00–0.07)
Basophils Absolute: 0.1 10*3/uL (ref 0.0–0.1)
Basophils Relative: 1 %
Eosinophils Absolute: 0.2 10*3/uL (ref 0.0–0.5)
Eosinophils Relative: 3 %
HCT: 31.9 % — ABNORMAL LOW (ref 36.0–46.0)
Hemoglobin: 9.7 g/dL — ABNORMAL LOW (ref 12.0–15.0)
Immature Granulocytes: 0 %
Lymphocytes Relative: 22 %
Lymphs Abs: 1.4 10*3/uL (ref 0.7–4.0)
MCH: 25.8 pg — ABNORMAL LOW (ref 26.0–34.0)
MCHC: 30.4 g/dL (ref 30.0–36.0)
MCV: 84.8 fL (ref 80.0–100.0)
Monocytes Absolute: 0.5 10*3/uL (ref 0.1–1.0)
Monocytes Relative: 8 %
Neutro Abs: 4 10*3/uL (ref 1.7–7.7)
Neutrophils Relative %: 66 %
Platelets: 326 10*3/uL (ref 150–400)
RBC: 3.76 MIL/uL — ABNORMAL LOW (ref 3.87–5.11)
RDW: 14.8 % (ref 11.5–15.5)
WBC: 6.1 10*3/uL (ref 4.0–10.5)
nRBC: 0 % (ref 0.0–0.2)

## 2021-01-25 LAB — SEDIMENTATION RATE: Sed Rate: 57 mm/hr — ABNORMAL HIGH (ref 0–22)

## 2021-01-25 LAB — CK: Total CK: 46 U/L (ref 38–234)

## 2021-01-25 MED ORDER — KETOROLAC TROMETHAMINE 60 MG/2ML IM SOLN
30.0000 mg | Freq: Once | INTRAMUSCULAR | Status: AC
  Administered 2021-01-25: 11:00:00 30 mg via INTRAMUSCULAR
  Filled 2021-01-25: qty 2

## 2021-01-25 MED ORDER — METHOCARBAMOL 500 MG PO TABS: 500.0000 mg | ORAL_TABLET | Freq: Three times a day (TID) | ORAL | 0 refills | Status: DC | PRN

## 2021-01-25 NOTE — ED Notes (Signed)
Pt states that her medication was not effective

## 2021-01-25 NOTE — ED Notes (Signed)
Patient transported to Ultrasound 

## 2021-01-25 NOTE — ED Notes (Signed)
Back from Xray.

## 2021-01-25 NOTE — Discharge Instructions (Signed)
Continue to treat pain with Tylenol.  Additionally, there is a prescription for Robaxin that was sent to your pharmacy of choice.  This is a muscle relaxer.  Take as needed if it helps with the pain.  Keep in mind that this may cause drowsiness so take with caution.  Follow-up with your primary care doctor soon as possible.  Return to the ED if you have worsening symptoms.

## 2021-01-25 NOTE — ED Triage Notes (Signed)
Pt has left leg pain  since Thursday. Pt does not have swelling, just sharp achy pain. Concerned she has A Fib and possibility of a clot causing pain.

## 2021-01-25 NOTE — ED Provider Notes (Signed)
[facility] EMERGENCY DEPT Provider Note   CSN: [id number] Arrival date & time: 01/25/21  7062     History Chief Complaint  Patient presents with   Leg Pain    Brittany Ford is a 75 y.o. female.  HPI Patient presents for left leg pain.  Onset was 5 days ago.  She did not have any recent traumas or straining.  It began in the distal aspect of her left leg.  Since 5 days ago, pain has been persistent.  It has progressed to areas of her entire left leg.  She initially rubbed CBD oil on it.  She tries to avoid taking pain medication.  She subsequently took Tylenol and found a 44-year-old prescription of oxycodone.  She was taking this.  She has not taken anything today.  She was in the ED waiting room last night but left due to prolonged wait time.  She does have a history of A. fib and is on Eliquis.  Last dose of Eliquis was last night.    Past Medical History:  Diagnosis Date   Atrial fibrillation (HCC)    Atrial flutter ([employer])    Chest pain    with typical and atypical qualities   Chronic low back pain    Diabetes mellitus without complication (HCC)    Diet Controlled   Dysrhythmia    ATRIAL FIBRILATION   GERD (gastroesophageal reflux disease)    Obesity     Patient Active Problem List   Diagnosis Date Noted   Statin myopathy 07/27/2020   Aortic aneurysm ([place]) 01/21/2020   Body mass index (BMI) 31.0-31.9, adult 01/21/2020   Depression 01/21/2020   Hyperglycemia due to type 2 diabetes mellitus ([place]) 01/21/2020   Hypothyroidism 01/21/2020   Iron deficiency anemia 01/21/2020   Kidney stone 01/21/2020   Left lower quadrant pain 01/21/2020   Obstructive sleep apnea syndrome 01/21/2020   Other specified conditions associated with female genital organs and menstrual cycle 01/21/2020   Pure hypercholesterolemia 01/21/2020   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   Cervical radiculopathy 10/27/2019   Lumbar facet arthropathy 10/27/2019   Dyslipidemia  06/17/2019   Essential hypertension 11/29/2017   Thickened endometrium 02/09/2017   Bilateral lower extremity edema 10/13/2014   Chronic anticoagulation 10/13/2014   Preoperative cardiovascular examination 02/19/2014   Mitral regurgitation 11/26/2012   Peripheral vascular disease ([place]) 11/26/2012   Chronic atrial fibrillation ([place]) 11/14/2012   Cheerleading 02/28/2012   Anemia associated with acute blood loss 02/28/2012   PAF (paroxysmal atrial fibrillation)/atrial flutter    Insomnia, unspecified 03/19/2009   Major depressive disorder, recurrent episode ([place]) 03/19/2009   Unsatisfactory cytologic smear of vagina 03/19/2009   Other and unspecified hyperlipidemia 12/22/2008   Type II or unspecified type diabetes mellitus without mention of complication, uncontrolled 12/22/2008   Anemia, unspecified 11/12/2008   Vitamin D deficiency 11/12/2008   Arthropathy 10/07/2008   Rash and other nonspecific skin eruption 10/07/2008   Urinary tract infection, site not specified 09/18/2008   Asthma 08/14/2008   Esophageal reflux 08/14/2008   DDD (degenerative disc disease), lumbosacral 02/20/2008    Past Surgical History:  Procedure Laterality Date   BREAST EXCISIONAL BIOPSY Right 1990s   benign   BREAST EXCISIONAL BIOPSY Left 1990s   benign   COLONOSCOPY  03/01/2012   Procedure: COLONOSCOPY;  Surgeon: Missy Sabins, MD;  Location: [facility];  Service: Endoscopy;  Laterality: N/A;   ESOPHAGOGASTRODUODENOSCOPY  02/29/2012   Procedure: ESOPHAGOGASTRODUODENOSCOPY (EGD);  Surgeon: Missy Sabins, MD;  Location: [facility]  ENDOSCOPY;  Service: Endoscopy;  Laterality: N/A;   FOOT SURGERY     x2   HYSTEROSCOPY WITH D & C N/A 02/09/2017   Procedure: DILATATION AND CURETTAGE /HYSTEROSCOPY;  Surgeon: Christophe Louis, MD;  Location: [facility];  Service: Gynecology;  Laterality: N/A;  w/Myosure for Polyp   TONSILLECTOMY       OB History   No obstetric history on file.     Family History  Problem Relation Age of  Onset   COPD Father    Heart attack Father    Lung cancer Father    Breast cancer Mother    Heart attack Brother    Heart disease Sister     Social History   Tobacco Use   Smoking status: Former    Packs/day: 0.50    Years: 20.00    Pack years: 10.00    Types: Cigarettes    Quit date: 07/20/2005    Years since quitting: 15.5   Smokeless tobacco: Never  Vaping Use   Vaping Use: Never used  Substance Use Topics   Alcohol use: No   Drug use: No    Home Medications Prior to Admission medications   Medication Sig Start Date End Date Taking? Authorizing Provider  methocarbamol (ROBAXIN) 500 MG tablet Take 1 tablet (500 mg total) by mouth every 8 (eight) hours as needed for muscle spasms. 01/25/21  Yes Godfrey Pick, MD  Accu-Chek FastClix Lancets MISC See admin instructions. 10/05/17   [provider]  ACCU-CHEK GUIDE test strip  11/21/19   [provider]  Acetaminophen (TYLENOL ARTHRITIS PAIN PO)     [provider]  apixaban (ELIQUIS) 5 MG TABS tablet Take 1 tablet (5 mg total) by mouth 2 (two) times daily. 03/03/19   Belva Crome, MD  Blood Glucose Monitoring Suppl (ACCU-CHEK AVIVA) device by Other route. Use as instructed    [provider]  Cholecalciferol (VITAMIN D) 2000 units CAPS Take 4,000 Units by mouth daily.    [provider]  flecainide (TAMBOCOR) 50 MG tablet TAKE 1 TABLET BY MOUTH TWICE A DAY 09/28/20   Belva Crome, MD  furosemide (LASIX) 20 MG tablet Take 20-40 mg by mouth daily as needed for fluid or edema.  09/14/14   [provider]  glipiZIDE (GLUCOTROL) 5 MG tablet TAKE 0.5 TABLETS (2.5 MG TOTAL) BY MOUTH DAILY BEFORE BREAKFAST. 01/18/21   Shamleffer, Melanie Crazier, MD  glucose blood test strip Use to check blood sugars 07/12/12   [provider]  HYDROcodone-acetaminophen (NORCO/VICODIN) 5-325 MG tablet Take 1 tablet by mouth every 4 (four) hours as needed. 09/21/20   Isla Pence, MD  magnesium  oxide (MAG-OX) 400 MG tablet Take 400 mg by mouth daily.    [provider]  melatonin 3 MG TABS tablet Take 3 mg by mouth at bedtime.    [provider]  metFORMIN (GLUCOPHAGE) 500 MG tablet Take 250 mg by mouth 2 (two) times daily. 05/27/19   [provider]  metoprolol tartrate (LOPRESSOR) 25 MG tablet TAKE 1 TABLET BY MOUTH 2 TIMES DAILY. PLEASE KEEP UPCOMING APPOINTMENT FOR FUTURE REFILLS. [place] YOU 10/19/20   Belva Crome, MD  Multiple Vitamin (MULTIVITAMIN WITH MINERALS) TABS tablet Take 1 tablet by mouth daily.    [provider]  predniSONE (STERAPRED UNI-PAK 21 TAB) 10 MG (21) TBPK tablet See admin instructions. 01/20/20   [provider]  traMADol (ULTRAM) 50 MG tablet Take 50 mg by mouth at bedtime as  needed for moderate pain.     [provider]  Zinc 50 MG TABS 1 tablet    [provider]    Allergies    Bee venom, Influenza virus vaccine, Atorvastatin, Crestor [rosuvastatin], Hydrocodone bit-homatrop mbr, Other, and Xarelto [rivaroxaban]  Review of Systems   Review of Systems  Constitutional:  Negative for activity change, chills, fatigue and fever.  HENT:  Negative for ear pain and sore throat.   Eyes:  Negative for pain and visual disturbance.  Respiratory:  Negative for cough and shortness of breath.   Cardiovascular:  Negative for chest pain, palpitations and leg swelling.  Gastrointestinal:  Negative for abdominal pain, diarrhea, nausea and vomiting.  Genitourinary:  Negative for dysuria, flank pain, hematuria and pelvic pain.  Musculoskeletal:  Positive for myalgias. Negative for arthralgias, back pain, joint swelling and neck pain.  Skin:  Negative for color change and rash.  Neurological:  Negative for dizziness, seizures, syncope, weakness, light-headedness and numbness.  Hematological:  Bruises/bleeds easily (On Eliquis).  Psychiatric/Behavioral:  Negative for confusion and decreased concentration.    All other systems reviewed and are negative.  Physical Exam Updated Vital Signs BP (!) 184/74    Pulse (!) 58    Temp 98.4 F (36.9 C)    Resp 15    Ht 5' 11" (1.803 m)    Wt 81.6 kg    SpO2 99%    BMI 25.10 kg/m   Physical Exam Vitals and nursing note reviewed.  Constitutional:      General: She is not in acute distress.    Appearance: Normal appearance. She is well-developed and normal weight. She is not ill-appearing, toxic-appearing or diaphoretic.  HENT:     Head: Normocephalic and atraumatic.     Right Ear: External ear normal.     Left Ear: External ear normal.     Nose: Nose normal.     Mouth/Throat:     Mouth: Mucous membranes are moist.     Pharynx: Oropharynx is clear.  Eyes:     Conjunctiva/sclera: Conjunctivae normal.  Cardiovascular:     Rate and Rhythm: Normal rate and regular rhythm.     Heart sounds: No murmur heard. Pulmonary:     Effort: Pulmonary effort is normal. No respiratory distress.     Breath sounds: Normal breath sounds. No wheezing or rales.  Chest:     Chest wall: No tenderness.  Abdominal:     General: Abdomen is flat.     Palpations: Abdomen is soft.     Tenderness: There is no abdominal tenderness.  Musculoskeletal:        General: Tenderness present. No swelling or deformity.     Cervical back: Normal range of motion and neck supple. No rigidity.     Right lower leg: No edema.     Left lower leg: No edema.     Comments: Active range of motion of LLE limited by pain.  No pain with passive range of motion.  Skin:    General: Skin is warm and dry.     Capillary Refill: Capillary refill takes less than 2 seconds.     Coloration: Skin is not jaundiced or pale.     Findings: No bruising.  Neurological:     General: No focal deficit present.     Mental Status: She is alert and oriented to person, place, and time.     Cranial Nerves: No cranial nerve deficit.     Sensory: No sensory deficit.  Motor: No weakness.     Coordination:  Coordination normal.  Psychiatric:        Mood and Affect: Mood normal.        Behavior: Behavior normal.        Thought Content: Thought content normal.        Judgment: Judgment normal.    ED Results / Procedures / Treatments   Labs (all labs ordered are listed, but only abnormal results are displayed) Labs Reviewed  CBC WITH DIFFERENTIAL/PLATELET - Abnormal; Notable for the following components:      Result Value   RBC 3.76 (*)    Hemoglobin 9.7 (*)    HCT 31.9 (*)    MCH 25.8 (*)    All other components within normal limits  COMPREHENSIVE METABOLIC PANEL - Abnormal; Notable for the following components:   AST 13 (*)    All other components within normal limits  SEDIMENTATION RATE - Abnormal; Notable for the following components:   Sed Rate 57 (*)    All other components within normal limits  CK  C-REACTIVE PROTEIN    EKG None  Radiology DG Knee 2 Views Left  Result Date: 01/25/2021 CLINICAL DATA:  Left leg pain EXAM: LEFT KNEE - 1-2 VIEW COMPARISON:  None. FINDINGS: Alignment is anatomic. No acute fracture. No joint effusion. Joint spaces are preserved. Chondrocalcinosis. Vascular calcifications. IMPRESSION: No acute osseous abnormality or joint effusion. Electronically Signed   By: Macy Mis M.D.   On: 01/25/2021 13:18   US Venous Img Lower Unilateral Left  Result Date: 01/25/2021 CLINICAL DATA:  Left lower extremity pain. EXAM: LEFT LOWER EXTREMITY VENOUS DOPPLER ULTRASOUND TECHNIQUE: Gray-scale sonography with graded compression, as well as color Doppler and duplex ultrasound were performed to evaluate the lower extremity deep venous systems from the level of the common femoral vein and including the common femoral, femoral, profunda femoral, popliteal and calf veins including the posterior tibial, peroneal and gastrocnemius veins when visible. The superficial great saphenous vein was also interrogated. Spectral Doppler was utilized to evaluate flow at rest and  with distal augmentation maneuvers in the common femoral, femoral and popliteal veins. COMPARISON:  None. FINDINGS: Contralateral Common Femoral Vein: Respiratory phasicity is normal and symmetric with the symptomatic side. No evidence of thrombus. Normal compressibility. Common Femoral Vein: No evidence of thrombus. Normal compressibility, respiratory phasicity and response to augmentation. Saphenofemoral Junction: No evidence of thrombus. Normal compressibility and flow on color Doppler imaging. Profunda Femoral Vein: No evidence of thrombus. Normal compressibility and flow on color Doppler imaging. Femoral Vein: No evidence of thrombus. Normal compressibility, respiratory phasicity and response to augmentation. Popliteal Vein: No evidence of thrombus. Normal compressibility, respiratory phasicity and response to augmentation. Calf Veins: No evidence of thrombus. Normal compressibility and flow on color Doppler imaging. Superficial Great Saphenous Vein: No evidence of thrombus. Normal compressibility. Venous Reflux:  None. Other Findings: No evidence of superficial thrombophlebitis or abnormal fluid collection. IMPRESSION: No evidence of left lower extremity deep venous thrombosis. Electronically Signed   By: Aletta Edouard M.D.   On: 01/25/2021 11:05   DG Hip Unilat W or Wo Pelvis 2-3 Views Left  Result Date: 01/25/2021 CLINICAL DATA:  Pain without trauma EXAM: DG HIP (WITH OR WITHOUT PELVIS) 2-3V LEFT COMPARISON:  None. FINDINGS: AP view of the pelvis and AP/frog leg views of the left hip. Femoral heads are located. No acute fracture. Vascular calcifications. Joint spaces are maintained for age. IMPRESSION: No acute osseous abnormality. Electronically Signed   By:  Abigail Miyamoto M.D.   On: 01/25/2021 13:18    Procedures Procedures   Medications Ordered in ED Medications  ketorolac (TORADOL) injection 30 mg (30 mg Intramuscular Given 01/25/21 1113)    ED Course  I have reviewed the triage vital  signs and the nursing notes.  Pertinent labs & imaging results that were available during my care of the patient were reviewed by me and considered in my medical decision making (see chart for details).    MDM Rules/Calculators/A&P                         75 year old female presenting for persistent LLE pain for the past 5 days.  Pain is achy in nature.  She denies any recent trauma or straining.  She has been treating her pain with Tylenol.  She is on Eliquis.  On arrival, she is well-appearing.  Vital signs are notable for hypertension.  She is afebrile.  She does have diffuse muscle tenderness in the left leg.  There are no areas of swelling or bruising.  Distal pulses are present.  Distal cap refill is brisk.  She is not on a statin.  Strength and sensation in left leg are intact.  Range of motion and strength are limited by pain.  She endorses pain and tenderness that is more severe distally.  Patient declined any oral medications for analgesia.  She stated that she "wanted a shot".  Shot of Toradol was ordered.  DVT study was ordered and results were negative.  On reassessment, patient endorses continued pain.  She continued to decline any oral analgesia.  X-rays were obtained which showed no osseous abnormalities.  Lab work showed normal CK, normal electrolytes.  Patient does have a 2.3 g/dL drop in her hemoglobin, when compared to lab work from 4 months ago.  This is unlikely to be causing her leg pain.  ESR, when age-adjusted, is normal.  Etiology of her leg pain remains indeterminate.  It could be secondary to PAD, however, patient is already on a blood thinner and distal pulses and cap refill are intact.  Described sharp pain is inconsistent with neuropathy.  She was given a prescription for muscle relaxer, to be taken as needed if it does improve symptoms.  She was otherwise advised to continue treating with Tylenol and to follow-up with her primary care doctor for reassessment.  She was  discharged in stable condition.   Final Clinical Impression(s) / ED Diagnoses Final diagnoses:  Left leg pain    Rx / DC Orders ED Discharge Orders          Ordered    methocarbamol (ROBAXIN) 500 MG tablet  Every 8 hours PRN        01/25/21 1414             Godfrey Pick, MD 01/26/21 2108

## 2021-01-25 NOTE — ED Notes (Signed)
Called for patient to be roomed with no response

## 2021-01-26 NOTE — Telephone Encounter (Signed)
Based on her description, it is unlikely to be related to peripheral arterial disease.  She should go see her primary care physician.  It appears that x-rays in the ED were unremarkable.

## 2021-01-27 NOTE — Telephone Encounter (Signed)
Spoke with patient. She state she will see her neurologist for her leg pain. No other concerns voiced.

## 2021-02-23 ENCOUNTER — Telehealth: Payer: Self-pay | Admitting: Cardiovascular Disease

## 2021-02-23 NOTE — Telephone Encounter (Signed)
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  I suspect that she will require angiography.  I can see her tomorrow morning in the [place] office if she can go there.

## 2021-02-23 NOTE — Telephone Encounter (Signed)
Spoke with the patient. She is unable to make it to [facility] since she does not have a car at the moment or anyone to bring her.   She has been added to Dr. Jari Sportsman schedule on 1/31 per her request. She has been advised to call for worsening symptoms or go to the ED.

## 2021-02-23 NOTE — Telephone Encounter (Signed)
Called and spoke to patient. Patient c/o pain in her left leg that shoots down into her  knee and calf. She states that her left foot gets numb from time to time. Denies numbness at this time. She has been rubbing CBD oil on it. Denies swelling or color changes. She states that both of her feet are cold and that the left is not worse than the right. Denies increased pain with walking. She states that this has been going on for a year. Previously seen by Dr. Katrinka Blazing who referred her to see Dr. Kirke Corin for PAD eval and Dr. Shari Prows for future follow-up.  She also c/o lower back pain. She has been seen in the ED for this and has tried to get in with her PCP but wait time is close to a year per patient. Patient states that she is going to reach out to her neurologist to see if they can see her. Patient states that she will call back if her Sx change or worsen.

## 2021-02-23 NOTE — Telephone Encounter (Signed)
Pt is needing someone to look at her leg.. pt has to use her hands to lift her leg up.. when she puts her leg down it goes completely numb. Please Advise.

## 2021-02-25 DIAGNOSIS — M25572 Pain in left ankle and joints of left foot: Secondary | ICD-10-CM | POA: Diagnosis not present

## 2021-02-25 DIAGNOSIS — S93402A Sprain of unspecified ligament of left ankle, initial encounter: Secondary | ICD-10-CM | POA: Diagnosis not present

## 2021-02-25 DIAGNOSIS — M79672 Pain in left foot: Secondary | ICD-10-CM | POA: Diagnosis not present

## 2021-02-25 DIAGNOSIS — M25562 Pain in left knee: Secondary | ICD-10-CM | POA: Diagnosis not present

## 2021-03-08 ENCOUNTER — Ambulatory Visit: Payer: Medicare Other | Admitting: Cardiovascular Disease

## 2021-03-08 ENCOUNTER — Other Ambulatory Visit: Payer: Self-pay

## 2021-03-08 ENCOUNTER — Encounter: Payer: Self-pay | Admitting: Cardiovascular Disease

## 2021-03-08 VITALS — BP 134/74 | HR 55 | Ht 71.0 in | Wt 175.8 lb

## 2021-03-08 DIAGNOSIS — E785 Hyperlipidemia, unspecified: Secondary | ICD-10-CM | POA: Diagnosis not present

## 2021-03-08 DIAGNOSIS — Z7901 Long term (current) use of anticoagulants: Secondary | ICD-10-CM

## 2021-03-08 DIAGNOSIS — I480 Paroxysmal atrial fibrillation: Secondary | ICD-10-CM | POA: Diagnosis not present

## 2021-03-08 DIAGNOSIS — I739 Peripheral vascular disease, unspecified: Secondary | ICD-10-CM | POA: Diagnosis not present

## 2021-03-08 LAB — BASIC METABOLIC PANEL
BUN/Creatinine Ratio: 15 (ref 12–28)
BUN: 17 mg/dL (ref 8–27)
CO2: 24 mmol/L (ref 20–29)
Calcium: 9.5 mg/dL (ref 8.7–10.3)
Chloride: 104 mmol/L (ref 96–106)
Creatinine, Ser: 1.14 mg/dL — ABNORMAL HIGH (ref 0.57–1.00)
Glucose: 106 mg/dL — ABNORMAL HIGH (ref 70–99)
Potassium: 4.3 mmol/L (ref 3.5–5.2)
Sodium: 139 mmol/L (ref 134–144)
eGFR: 51 mL/min/{1.73_m2} — ABNORMAL LOW (ref >59)

## 2021-03-08 LAB — CBC
Hematocrit: 32.7 % — ABNORMAL LOW (ref 34.0–46.6)
Hemoglobin: 10.6 g/dL — ABNORMAL LOW (ref 11.1–15.9)
MCH: 26.4 pg — ABNORMAL LOW (ref 26.6–33.0)
MCHC: 32.4 g/dL (ref 31.5–35.7)
MCV: 82 fL (ref 79–97)
Platelets: 313 10*3/uL (ref 150–450)
RBC: 4.01 x10E6/uL (ref 3.77–5.28)
RDW: 14.6 % (ref 11.7–15.4)
WBC: 7.2 10*3/uL (ref 3.4–10.8)

## 2021-03-08 MED ORDER — TRAMADOL HCL 50 MG PO TABS: 50.0000 mg | ORAL_TABLET | Freq: Every evening | ORAL | 0 refills | Status: DC | PRN

## 2021-03-08 NOTE — Progress Notes (Signed)
Cardiology Office Note   Date:  03/08/2021   ID:  Brittany Ford, DOB 08-24-1945, MRN [id number]  PCP:  Sigmund Hazel, MD  Cardiologist:  Dr. Katrinka Blazing  No chief complaint on file.     History of Present Illness: Brittany Ford is a 76 y.o. female who is here today for follow-up visit regarding peripheral arterial disease.    She has history of paroxysmal atrial fibrillation on flecainide, type 2 diabetes, hyperlipidemia with poor tolerance to statins, chronic back pain, GERD and obesity.  She is not a smoker. She was seen recently for atypical left leg claudication but her symptoms were felt to be multifactorial overall with contribution from her spine disease and as well arthritis.  She underwent lower extremity arterial Doppler in June which showed an ABI of 0.84 on the right and 0.65 on the left.  Duplex showed moderate SFA disease on the right side and significant proximal/mid SFA disease on the left.  Initially, I suggested continued medical therapy but she returns with worsening left leg pain with numbness and cold feeling in the left foot.  In addition, she has increased pain in the left knee after recent fall.  No lower extremity ulceration.  Past Medical History:  Diagnosis Date   Atrial fibrillation (HCC)    Atrial flutter (HCC)    Chest pain    with typical and atypical qualities   Chronic low back pain    Diabetes mellitus without complication (HCC)    Diet Controlled   Dysrhythmia    ATRIAL FIBRILATION   GERD (gastroesophageal reflux disease)    Obesity     Past Surgical History:  Procedure Laterality Date   BREAST EXCISIONAL BIOPSY Right 1990s   benign   BREAST EXCISIONAL BIOPSY Left 1990s   benign   COLONOSCOPY  03/01/2012   Procedure: COLONOSCOPY;  Surgeon: [name], MD;  Location: [facility] ENDOSCOPY;  Service: Endoscopy;  Laterality: N/A;   ESOPHAGOGASTRODUODENOSCOPY  02/29/2012   Procedure: ESOPHAGOGASTRODUODENOSCOPY (EGD);  Surgeon: [name],  MD;  Location: [facility] ENDOSCOPY;  Service: Endoscopy;  Laterality: N/A;   FOOT SURGERY     x2   HYSTEROSCOPY WITH D & C N/A 02/09/2017   Procedure: DILATATION AND CURETTAGE /HYSTEROSCOPY;  Surgeon: Gerald Leitz, MD;  Location: [facility] ORS;  Service: Gynecology;  Laterality: N/A;  w/Myosure for Polyp   TONSILLECTOMY       Current Outpatient Medications  Medication Sig Dispense Refill   Accu-Chek FastClix Lancets MISC See admin instructions.     ACCU-CHEK GUIDE test strip      Acetaminophen (TYLENOL ARTHRITIS PAIN PO)      apixaban (ELIQUIS) 5 MG TABS tablet Take 1 tablet (5 mg total) by mouth 2 (two) times daily. 180 tablet 3   Blood Glucose Monitoring Suppl (ACCU-CHEK AVIVA) device by Other route. Use as instructed     Cholecalciferol (VITAMIN D) 2000 units CAPS Take 4,000 Units by mouth daily.     flecainide (TAMBOCOR) 50 MG tablet TAKE 1 TABLET BY MOUTH TWICE A DAY 180 tablet 2   glipiZIDE (GLUCOTROL) 5 MG tablet TAKE 0.5 TABLETS (2.5 MG TOTAL) BY MOUTH DAILY BEFORE BREAKFAST. 45 tablet 0   glucose blood test strip Use to check blood sugars     HYDROcodone-acetaminophen (NORCO/VICODIN) 5-325 MG tablet Take 1 tablet by mouth every 4 (four) hours as needed. 10 tablet 0   magnesium oxide (MAG-OX) 400 MG tablet Take 400 mg by mouth daily.     melatonin 3 MG  TABS tablet Take 3 mg by mouth at bedtime.    ° metFORMIN (GLUCOPHAGE) 500 MG tablet Take 250 mg by mouth 2 (two) times daily.    ° metoprolol tartrate (LOPRESSOR) 25 MG tablet TAKE 1 TABLET BY MOUTH 2 TIMES DAILY. PLEASE KEEP UPCOMING APPOINTMENT FOR FUTURE REFILLS. THANK YOU 180 tablet 2  ° Multiple Vitamin (MULTIVITAMIN WITH MINERALS) TABS tablet Take 1 tablet by mouth daily.    ° Zinc 50 MG TABS 1 tablet    ° methocarbamol (ROBAXIN) 500 MG tablet Take 1 tablet (500 mg total) by mouth every 8 (eight) hours as needed for muscle spasms. (Patient not taking: Reported on 03/08/2021) 20 tablet 0  ° traMADol (ULTRAM) 50 MG tablet Take 1 tablet (50 mg total) by  mouth at bedtime as needed for moderate pain. 15 tablet 0  ° °No current facility-administered medications for this visit.  ° ° °Allergies:   Bee venom, Influenza virus vaccine, Atorvastatin, Crestor [rosuvastatin], Hydrocodone bit-homatrop mbr, Other, and Xarelto [rivaroxaban]  ° ° °Social History:  The patient  reports that she quit smoking about 15 years ago. Her smoking use included cigarettes. She has a 10.00 pack-year smoking history. She has never used smokeless tobacco. She reports that she does not drink alcohol and does not use drugs.  ° °Family History:  The patient's family history includes Breast cancer in her mother; COPD in her father; Heart attack in her brother and father; Heart disease in her sister; Lung cancer in her father.  ° ° °ROS:  Please see the history of present illness.   Otherwise, review of systems are positive for none.   All other systems are reviewed and negative.  ° ° °PHYSICAL EXAM: °VS:  BP 134/74    Pulse (!) 55    Ht 5' 11" (1.803 m)    Wt 175 lb 12.8 oz (79.7 kg)    SpO2 96%    BMI 24.52 kg/m²  , BMI Body mass index is 24.52 kg/m². °GEN: Well nourished, well developed, in no acute distress  °HEENT: normal  °Neck: no JVD, carotid bruits, or masses °Cardiac: RRR; no murmurs, rubs, or gallops,no edema  °Respiratory:  clear to auscultation bilaterally, normal work of breathing °GI: soft, nontender, nondistended, + BS °MS: no deformity or atrophy  °Skin: warm and dry, no rash °Neuro:  Strength and sensation are intact °Psych: euthymic mood, full affect °Vascular: Femoral pulse: +2 bilaterally.  Distal pulses are not palpable. ° ° °EKG:  EKG is ordered today. °EKG showed sinus bradycardia with sinus arrhythmia. ° ° ° °Recent Labs: °01/25/2021: ALT 6; BUN 13; Creatinine, Ser 0.95; Hemoglobin 9.7; Platelets 326; Potassium 3.8; Sodium 138  ° ° °Lipid Panel °   °Component Value Date/Time  ° CHOL 210 (H) 04/22/2013 2306  ° TRIG 200 (H) 04/22/2013 2306  ° HDL 32 (L) 04/22/2013 2306  °  CHOLHDL 6.6 04/22/2013 2306  ° VLDL 40 04/22/2013 2306  ° LDLCALC 138 (H) 04/22/2013 2306  ° °  ° °Wt Readings from Last 3 Encounters:  °03/08/21 175 lb 12.8 oz (79.7 kg)  °01/25/21 180 lb (81.6 kg)  °01/24/21 186 lb 15.2 oz (84.8 kg)  °  ° ° ° ° °No flowsheet data found. ° ° ° °ASSESSMENT AND PLAN: ° °1.  Peripheral arterial disease: Prolonged discussion with the patient today about her symptoms.  I still think that her symptoms are multifactorial and not all necessarily related to peripheral arterial disease.  Nonetheless, she does have severe stenosis   in the proximal left SFA with possible short occlusion.  This is likely responsible for some of her symptoms but it is hard to know how much improvement she will have until revascularization is done.  Patient wants some relief of symptoms.   Thus, I recommend proceeding with abdominal aortogram with left lower extremity runoff and possible endovascular intervention.  I discussed the procedure in details as well as risks and benefits.  Hold Eliquis 2 days before.  2.  Paroxysmal atrial fibrillation: She is on long-term anticoagulation with Eliquis.  3.  Hyperlipidemia: Her LDL is usually close to 150 and she reports intolerance to rosuvastatin and atorvastatin.  I discussed with her the importance of treating her hyperlipidemia given her recent diagnosis of peripheral arterial disease and her known history of diabetes mellitus.  I offered a trial of PCSK9 inhibitors but the patient is adamant about not trying any cholesterol medications.    Disposition:   Proceed with an angiogram and follow-up 2 weeks after.  Signed,  Lorine Bears, MD  03/08/2021 9:22 AM    [facility]

## 2021-03-08 NOTE — Patient Instructions (Signed)
Medication Instructions:  No changes *If you need a refill on your cardiac medications before your next appointment, please call your pharmacy*  Testing/Procedures: Your physician has requested that you have a peripheral vascular angiogram. This exam is performed at the hospital. During this exam IV contrast is used to look at arterial blood flow. Please review the information sheet given for details.   Follow-Up: At [facility], you and your health needs are our priority.  As part of our continuing mission to provide you with exceptional heart care, we have created designated Provider Care Teams.  These Care Teams include your primary Cardiologist (physician) and Advanced Practice Providers (APPs -  Physician Assistants and Nurse Practitioners) who all work together to provide you with the care you need, when you need it.  We recommend signing up for the patient portal called "MyChart".  Sign up information is provided on this After Visit Summary.  MyChart is used to connect with patients for Virtual Visits (Telemedicine).  Patients are able to view lab/test results, encounter notes, upcoming appointments, etc.  Non-urgent messages can be sent to your provider as well.   To learn more about what you can do with MyChart, go to [email or web address].    Your next appointment:   Keep your post procedure follow up with Dr. Kirke Corin on 3/21 at 9:40 am  Other Instructions  [facility] CARDIOVASCULAR DIVISION [facility] [address] [place] [address] Dept: [phone] Loc: [phone]  Persia Lintner  03/08/2021  You are scheduled for a Peripheral Angiogram on Wednesday, February 15 with Dr. Lorine Bears.  1. Please arrive at the [facility] (Main Entrance A) at [facility]: [address] [place], [place] [address] at 6:30 AM (This time is two hours before your procedure to ensure your preparation). Free valet parking  service is available.   Special note: Every effort is made to have your procedure done on time. Please understand that emergencies sometimes delay scheduled procedures.  2. Diet: Do not eat solid foods after midnight.  The patient may have clear liquids until 5am upon the day of the procedure.  3. Labs: You will need to have blood drawn on 03/08/21  4. Medication instructions in preparation for your procedure: Hold the Eliquis two days prior to the procedure (hold on 2/13 and 2/14 plus the day of the procedure) Take Aspirin 81 mg once daily the two days you do not take the Eliquis Hold all diabetic medication the morning of the procedure (glipizide and metformin). The Metformin will be held the morning of the procedure and then 48 hours after.   On the morning of your procedure, take your Aspirin and any morning medicines NOT listed above.  You may use sips of water.  5. Plan for one night stay--bring personal belongings. 6. Bring a current list of your medications and current insurance cards. 7. You MUST have a responsible person to drive you home. 8. Someone MUST be with you the first 24 hours after you arrive home or your discharge will be delayed. 9. Please wear clothes that are easy to get on and off and wear slip-on shoes.  Thank you for allowing [place] to care for you!   -- [place] [facility]

## 2021-03-08 NOTE — H&P (View-Only) (Signed)
Cardiology Office Note   Date:  03/08/2021   ID:  Brittany Ford, DOB 08-24-1945, MRN [id number]  PCP:  Sigmund Hazel, MD  Cardiologist:  Dr. Katrinka Blazing  No chief complaint on file.     History of Present Illness: Brittany Ford is a 76 y.o. female who is here today for follow-up visit regarding peripheral arterial disease.    She has history of paroxysmal atrial fibrillation on flecainide, type 2 diabetes, hyperlipidemia with poor tolerance to statins, chronic back pain, GERD and obesity.  She is not a smoker. She was seen recently for atypical left leg claudication but her symptoms were felt to be multifactorial overall with contribution from her spine disease and as well arthritis.  She underwent lower extremity arterial Doppler in June which showed an ABI of 0.84 on the right and 0.65 on the left.  Duplex showed moderate SFA disease on the right side and significant proximal/mid SFA disease on the left.  Initially, I suggested continued medical therapy but she returns with worsening left leg pain with numbness and cold feeling in the left foot.  In addition, she has increased pain in the left knee after recent fall.  No lower extremity ulceration.  Past Medical History:  Diagnosis Date   Atrial fibrillation (HCC)    Atrial flutter (HCC)    Chest pain    with typical and atypical qualities   Chronic low back pain    Diabetes mellitus without complication (HCC)    Diet Controlled   Dysrhythmia    ATRIAL FIBRILATION   GERD (gastroesophageal reflux disease)    Obesity     Past Surgical History:  Procedure Laterality Date   BREAST EXCISIONAL BIOPSY Right 1990s   benign   BREAST EXCISIONAL BIOPSY Left 1990s   benign   COLONOSCOPY  03/01/2012   Procedure: COLONOSCOPY;  Surgeon: [name], MD;  Location: [facility] ENDOSCOPY;  Service: Endoscopy;  Laterality: N/A;   ESOPHAGOGASTRODUODENOSCOPY  02/29/2012   Procedure: ESOPHAGOGASTRODUODENOSCOPY (EGD);  Surgeon: [name],  MD;  Location: [facility] ENDOSCOPY;  Service: Endoscopy;  Laterality: N/A;   FOOT SURGERY     x2   HYSTEROSCOPY WITH D & C N/A 02/09/2017   Procedure: DILATATION AND CURETTAGE /HYSTEROSCOPY;  Surgeon: Gerald Leitz, MD;  Location: [facility] ORS;  Service: Gynecology;  Laterality: N/A;  w/Myosure for Polyp   TONSILLECTOMY       Current Outpatient Medications  Medication Sig Dispense Refill   Accu-Chek FastClix Lancets MISC See admin instructions.     ACCU-CHEK GUIDE test strip      Acetaminophen (TYLENOL ARTHRITIS PAIN PO)      apixaban (ELIQUIS) 5 MG TABS tablet Take 1 tablet (5 mg total) by mouth 2 (two) times daily. 180 tablet 3   Blood Glucose Monitoring Suppl (ACCU-CHEK AVIVA) device by Other route. Use as instructed     Cholecalciferol (VITAMIN D) 2000 units CAPS Take 4,000 Units by mouth daily.     flecainide (TAMBOCOR) 50 MG tablet TAKE 1 TABLET BY MOUTH TWICE A DAY 180 tablet 2   glipiZIDE (GLUCOTROL) 5 MG tablet TAKE 0.5 TABLETS (2.5 MG TOTAL) BY MOUTH DAILY BEFORE BREAKFAST. 45 tablet 0   glucose blood test strip Use to check blood sugars     HYDROcodone-acetaminophen (NORCO/VICODIN) 5-325 MG tablet Take 1 tablet by mouth every 4 (four) hours as needed. 10 tablet 0   magnesium oxide (MAG-OX) 400 MG tablet Take 400 mg by mouth daily.     melatonin 3 MG  TABS tablet Take 3 mg by mouth at bedtime.     metFORMIN (GLUCOPHAGE) 500 MG tablet Take 250 mg by mouth 2 (two) times daily.     metoprolol tartrate (LOPRESSOR) 25 MG tablet TAKE 1 TABLET BY MOUTH 2 TIMES DAILY. PLEASE KEEP UPCOMING APPOINTMENT FOR FUTURE REFILLS. THANK YOU 180 tablet 2   Multiple Vitamin (MULTIVITAMIN WITH MINERALS) TABS tablet Take 1 tablet by mouth daily.     Zinc 50 MG TABS 1 tablet     methocarbamol (ROBAXIN) 500 MG tablet Take 1 tablet (500 mg total) by mouth every 8 (eight) hours as needed for muscle spasms. (Patient not taking: Reported on 03/08/2021) 20 tablet 0   traMADol (ULTRAM) 50 MG tablet Take 1 tablet (50 mg total) by  mouth at bedtime as needed for moderate pain. 15 tablet 0   No current facility-administered medications for this visit.    Allergies:   Bee venom, Influenza virus vaccine, Atorvastatin, Crestor [rosuvastatin], Hydrocodone bit-homatrop mbr, Other, and Xarelto [rivaroxaban]    Social History:  The patient  reports that she quit smoking about 15 years ago. Her smoking use included cigarettes. She has a 10.00 pack-year smoking history. She has never used smokeless tobacco. She reports that she does not drink alcohol and does not use drugs.   Family History:  The patient's family history includes Breast cancer in her mother; COPD in her father; Heart attack in her brother and father; Heart disease in her sister; Lung cancer in her father.    ROS:  Please see the history of present illness.   Otherwise, review of systems are positive for none.   All other systems are reviewed and negative.    PHYSICAL EXAM: VS:  BP 134/74    Pulse (!) 55    Ht 5\' 11"  (1.803 m)    Wt 175 lb 12.8 oz (79.7 kg)    SpO2 96%    BMI 24.52 kg/m  , BMI Body mass index is 24.52 kg/m. GEN: Well nourished, well developed, in no acute distress  HEENT: normal  Neck: no JVD, carotid bruits, or masses Cardiac: RRR; no murmurs, rubs, or gallops,no edema  Respiratory:  clear to auscultation bilaterally, normal work of breathing GI: soft, nontender, nondistended, + BS MS: no deformity or atrophy  Skin: warm and dry, no rash Neuro:  Strength and sensation are intact Psych: euthymic mood, full affect Vascular: Femoral pulse: +2 bilaterally.  Distal pulses are not palpable.   EKG:  EKG is ordered today. EKG showed sinus bradycardia with sinus arrhythmia.    Recent Labs: 01/25/2021: ALT 6; BUN 13; Creatinine, Ser 0.95; Hemoglobin 9.7; Platelets 326; Potassium 3.8; Sodium 138    Lipid Panel    Component Value Date/Time   CHOL 210 (H) 04/22/2013 2306   TRIG 200 (H) 04/22/2013 2306   HDL 32 (L) 04/22/2013 2306    CHOLHDL 6.6 04/22/2013 2306   VLDL 40 04/22/2013 2306   LDLCALC 138 (H) 04/22/2013 2306      Wt Readings from Last 3 Encounters:  03/08/21 175 lb 12.8 oz (79.7 kg)  01/25/21 180 lb (81.6 kg)  01/24/21 186 lb 15.2 oz (84.8 kg)        No flowsheet data found.    ASSESSMENT AND PLAN:  1.  Peripheral arterial disease: Prolonged discussion with the patient today about her symptoms.  I still think that her symptoms are multifactorial and not all necessarily related to peripheral arterial disease.  Nonetheless, she does have severe stenosis  in the proximal left SFA with possible short occlusion.  This is likely responsible for some of her symptoms but it is hard to know how much improvement she will have until revascularization is done.  Patient wants some relief of symptoms.   Thus, I recommend proceeding with abdominal aortogram with left lower extremity runoff and possible endovascular intervention.  I discussed the procedure in details as well as risks and benefits.  Hold Eliquis 2 days before.  2.  Paroxysmal atrial fibrillation: She is on long-term anticoagulation with Eliquis.  3.  Hyperlipidemia: Her LDL is usually close to 150 and she reports intolerance to rosuvastatin and atorvastatin.  I discussed with her the importance of treating her hyperlipidemia given her recent diagnosis of peripheral arterial disease and her known history of diabetes mellitus.  I offered a trial of PCSK9 inhibitors but the patient is adamant about not trying any cholesterol medications.    Disposition:   Proceed with an angiogram and follow-up 2 weeks after.  Signed,  Lorine Bears, MD  03/08/2021 9:22 AM    [facility]

## 2021-03-15 ENCOUNTER — Telehealth: Payer: Self-pay | Admitting: Cardiovascular Disease

## 2021-03-15 NOTE — Telephone Encounter (Signed)
Patient has an upcoming procedure on 2/15, states that she is experiencing some swelling and would like to know if it is ok for her to take a Dieretic... please advise   Pt c/o swelling: STAT is pt has developed SOB within 24 hours  If swelling, where is the swelling located? Left ankle   How much weight have you gained and in what time span? Not sure   Have you gained 3 pounds in a day or 5 pounds in a week? Not sure   Do you have a log of your daily weights (if so, list)? 175.8 last week   Are you currently taking a fluid pill? no  Are you currently SOB? no  Have you traveled recently? No

## 2021-03-15 NOTE — Telephone Encounter (Signed)
Spoke with patient who reports swelling in her left ankle since Sunday and wants a diuretic. She has not gained any weight and her clothing does not fit any tighter. Denies sob. She wore a support stocking that did not help. She did say she fell 3 weeks ago and twisted her left ankle, went to urgent care, no fractures. She said she has been walking more lately. Recommended she use her cane to ambulate, elevate her leg when sitting, and to use ice on her ankle to see if she gets any relief from the swelling.

## 2021-03-17 NOTE — Telephone Encounter (Signed)
Called to check on the patient. She stated that that swelling was better. Went over the instructions for the procedure on 2/15. She has been advised to call back if anything further is needed.

## 2021-03-22 ENCOUNTER — Telehealth: Payer: Self-pay | Admitting: *Deleted

## 2021-03-22 NOTE — Telephone Encounter (Signed)
Abdominal aortogram scheduled at [facility] for: Wednesday March 23, 2021 8:30 AM [facility] Main Entrance A [facility]) at: 6:30 AM   Diet-no solid food after midnight prior to cath, clear liquids until 5 AM day of procedure.  Medication instructions for procedure: -Hold:  Eliquis-none 03/21/21 until post procedure  Metformin-day of procedure and 48 hours post procedure Glipizide-AM of procedure -Except hold medications usual morning medications can be taken pre-cath with sips of water including aspirin 81 mg.    Must have responsible adult to drive home post procedure and be with patient first 24 hours after arriving home.  [facility] does allow one visitor to wait in the waiting room during the time you are there.   Patient reports does not currently have any new symptoms concerning for COVID-19 and no household members with COVID-19 like illness.       Reviewed procedure instructions with patient.

## 2021-03-23 ENCOUNTER — Encounter (HOSPITAL_COMMUNITY)
Admission: RE | Disposition: A | Discharge status: Home / Self Care | Payer: Self-pay | Source: Home / Self Care | Attending: Cardiovascular Disease

## 2021-03-23 ENCOUNTER — Ambulatory Visit (HOSPITAL_COMMUNITY)
Admission: RE | Admit: 2021-03-23 | Discharge: 2021-03-23 | Disposition: A | Discharge status: Home / Self Care | Payer: Medicare Other | Attending: Cardiovascular Disease | Admitting: Cardiovascular Disease

## 2021-03-23 ENCOUNTER — Other Ambulatory Visit: Payer: Self-pay

## 2021-03-23 ENCOUNTER — Other Ambulatory Visit: Payer: Self-pay | Admitting: *Deleted

## 2021-03-23 DIAGNOSIS — Z87891 Personal history of nicotine dependence: Secondary | ICD-10-CM | POA: Diagnosis not present

## 2021-03-23 DIAGNOSIS — Z7901 Long term (current) use of anticoagulants: Secondary | ICD-10-CM | POA: Diagnosis not present

## 2021-03-23 DIAGNOSIS — Z7984 Long term (current) use of oral hypoglycemic drugs: Secondary | ICD-10-CM | POA: Diagnosis not present

## 2021-03-23 DIAGNOSIS — Z79899 Other long term (current) drug therapy: Secondary | ICD-10-CM | POA: Diagnosis not present

## 2021-03-23 DIAGNOSIS — E1151 Type 2 diabetes mellitus with diabetic peripheral angiopathy without gangrene: Secondary | ICD-10-CM | POA: Insufficient documentation

## 2021-03-23 DIAGNOSIS — E785 Hyperlipidemia, unspecified: Secondary | ICD-10-CM | POA: Diagnosis not present

## 2021-03-23 DIAGNOSIS — K219 Gastro-esophageal reflux disease without esophagitis: Secondary | ICD-10-CM | POA: Diagnosis not present

## 2021-03-23 DIAGNOSIS — I480 Paroxysmal atrial fibrillation: Secondary | ICD-10-CM | POA: Insufficient documentation

## 2021-03-23 DIAGNOSIS — I739 Peripheral vascular disease, unspecified: Secondary | ICD-10-CM

## 2021-03-23 DIAGNOSIS — E669 Obesity, unspecified: Secondary | ICD-10-CM | POA: Diagnosis not present

## 2021-03-23 DIAGNOSIS — Z6824 Body mass index (BMI) 24.0-24.9, adult: Secondary | ICD-10-CM | POA: Insufficient documentation

## 2021-03-23 DIAGNOSIS — I714 Abdominal aortic aneurysm, without rupture, unspecified: Secondary | ICD-10-CM | POA: Insufficient documentation

## 2021-03-23 DIAGNOSIS — I70212 Atherosclerosis of native arteries of extremities with intermittent claudication, left leg: Secondary | ICD-10-CM | POA: Diagnosis not present

## 2021-03-23 HISTORY — PX: ABDOMINAL AORTOGRAM W/LOWER EXTREMITY: CATH118223

## 2021-03-23 HISTORY — PX: PERIPHERAL VASCULAR ATHERECTOMY: CATH118256

## 2021-03-23 LAB — POCT ACTIVATED CLOTTING TIME
Activated Clotting Time: 233 seconds
Activated Clotting Time: 233 seconds

## 2021-03-23 LAB — GLUCOSE, CAPILLARY
Glucose-Capillary: 148 mg/dL — ABNORMAL HIGH (ref 70–99)
Glucose-Capillary: 111 mg/dL — ABNORMAL HIGH (ref 70–99)

## 2021-03-23 SURGERY — ABDOMINAL AORTOGRAM W/LOWER EXTREMITY
Anesthesia: LOCAL | Laterality: Left

## 2021-03-23 MED ORDER — CLOPIDOGREL BISULFATE 300 MG PO TABS
ORAL_TABLET | ORAL | Status: DC | PRN
  Administered 2021-03-23: 300 mg via ORAL

## 2021-03-23 MED ORDER — ONDANSETRON HCL 4 MG/2ML IJ SOLN: 4.0000 mg | Freq: Four times a day (QID) | INTRAMUSCULAR | Status: DC | PRN

## 2021-03-23 MED ORDER — LIDOCAINE HCL (PF) 1 % IJ SOLN
INTRAMUSCULAR | Status: DC | PRN
  Administered 2021-03-23: 15 mL

## 2021-03-23 MED ORDER — HEPARIN (PORCINE) IN NACL 1000-0.9 UT/500ML-% IV SOLN
INTRAVENOUS | Status: AC
  Filled 2021-03-23: qty 500

## 2021-03-23 MED ORDER — NITROGLYCERIN 1 MG/10 ML FOR IR/CATH LAB
INTRA_ARTERIAL | Status: AC
  Filled 2021-03-23: qty 10

## 2021-03-23 MED ORDER — SODIUM CHLORIDE 0.9 % IV SOLN: INTRAVENOUS | Status: DC

## 2021-03-23 MED ORDER — HEPARIN SODIUM (PORCINE) 1000 UNIT/ML IJ SOLN
INTRAMUSCULAR | Status: DC | PRN
  Administered 2021-03-23: 7000 [IU] via INTRAVENOUS
  Administered 2021-03-23: 2000 [IU] via INTRAVENOUS

## 2021-03-23 MED ORDER — LABETALOL HCL 5 MG/ML IV SOLN
INTRAVENOUS | Status: DC | PRN
  Administered 2021-03-23: 10 mg via INTRAVENOUS

## 2021-03-23 MED ORDER — SODIUM CHLORIDE 0.9% FLUSH: 3.0000 mL | INTRAVENOUS | Status: DC | PRN

## 2021-03-23 MED ORDER — MIDAZOLAM HCL 2 MG/2ML IJ SOLN
INTRAMUSCULAR | Status: AC
  Filled 2021-03-23: qty 2

## 2021-03-23 MED ORDER — FENTANYL CITRATE (PF) 100 MCG/2ML IJ SOLN
INTRAMUSCULAR | Status: DC | PRN
  Administered 2021-03-23: 25 ug via INTRAVENOUS
  Administered 2021-03-23: 50 ug via INTRAVENOUS
  Administered 2021-03-23: 25 ug via INTRAVENOUS

## 2021-03-23 MED ORDER — SODIUM CHLORIDE 0.9% FLUSH: 3.0000 mL | Freq: Two times a day (BID) | INTRAVENOUS | Status: DC

## 2021-03-23 MED ORDER — SODIUM CHLORIDE 0.9 % IV SOLN: 250.0000 mL | INTRAVENOUS | Status: DC | PRN

## 2021-03-23 MED ORDER — IODIXANOL 320 MG/ML IV SOLN
INTRAVENOUS | Status: DC | PRN
  Administered 2021-03-23: 90 mL via INTRA_ARTERIAL

## 2021-03-23 MED ORDER — CLOPIDOGREL BISULFATE 300 MG PO TABS
ORAL_TABLET | ORAL | Status: AC
  Filled 2021-03-23: qty 1

## 2021-03-23 MED ORDER — LABETALOL HCL 5 MG/ML IV SOLN
INTRAVENOUS | Status: AC
  Filled 2021-03-23: qty 4

## 2021-03-23 MED ORDER — LABETALOL HCL 5 MG/ML IV SOLN: 10.0000 mg | INTRAVENOUS | Status: DC | PRN

## 2021-03-23 MED ORDER — ASPIRIN 81 MG PO CHEW: 81.0000 mg | CHEWABLE_TABLET | ORAL | Status: DC

## 2021-03-23 MED ORDER — MIDAZOLAM HCL 2 MG/2ML IJ SOLN
INTRAMUSCULAR | Status: DC | PRN
  Administered 2021-03-23: 1 mg via INTRAVENOUS

## 2021-03-23 MED ORDER — LIDOCAINE HCL (PF) 1 % IJ SOLN
INTRAMUSCULAR | Status: AC
  Filled 2021-03-23: qty 30

## 2021-03-23 MED ORDER — SODIUM CHLORIDE 0.9 % WEIGHT BASED INFUSION
3.0000 mL/kg/h | INTRAVENOUS | Status: AC
  Administered 2021-03-23: 3 mL/kg/h via INTRAVENOUS

## 2021-03-23 MED ORDER — HEPARIN (PORCINE) IN NACL 1000-0.9 UT/500ML-% IV SOLN
INTRAVENOUS | Status: DC | PRN
  Administered 2021-03-23 (×2): 500 mL

## 2021-03-23 MED ORDER — ACETAMINOPHEN 325 MG PO TABS: 650.0000 mg | ORAL_TABLET | ORAL | Status: DC | PRN

## 2021-03-23 MED ORDER — NITROGLYCERIN 1 MG/10 ML FOR IR/CATH LAB
INTRA_ARTERIAL | Status: DC | PRN
  Administered 2021-03-23: 400 ug via INTRA_ARTERIAL

## 2021-03-23 MED ORDER — FENTANYL CITRATE (PF) 100 MCG/2ML IJ SOLN
INTRAMUSCULAR | Status: AC
  Filled 2021-03-23: qty 2

## 2021-03-23 MED ORDER — CLOPIDOGREL BISULFATE 75 MG PO TABS
75.0000 mg | ORAL_TABLET | Freq: Every day | ORAL | 0 refills | Status: DC
Start: 2021-03-23 — End: 2021-04-26

## 2021-03-23 MED ORDER — SODIUM CHLORIDE 0.9 % WEIGHT BASED INFUSION: 1.0000 mL/kg/h | INTRAVENOUS | Status: DC

## 2021-03-23 SURGICAL SUPPLY — 24 items
BALLN ADMIRAL INPACT 6X250 (BALLOONS) ×2
BALLN IN.PACT DCB 6X120 (BALLOONS) ×2
BALLOON ADMIRAL INPACT 6X250 (BALLOONS) IMPLANT
CATH ANGIO 5F PIGTAIL 65CM (CATHETERS) ×1 IMPLANT
CATH CROSS OVER TEMPO 5F (CATHETERS) ×1 IMPLANT
CATH CXI SUPP ST 4FR 135CM (CATHETERS) ×1 IMPLANT
CATH HAWKONE LX EXTENDED TIP (CATHETERS) ×1 IMPLANT
CATH SOFT-VU ST 4F 90CM (CATHETERS) ×1 IMPLANT
CATH STRAIGHT 5FR 65CM (CATHETERS) ×1 IMPLANT
CLOSURE PERCLOSE PROSTYLE (VASCULAR PRODUCTS) ×1 IMPLANT
DCB IN.PACT 6X120 (BALLOONS) IMPLANT
DEVICE SPIDERFX EMB PROT 6MM (WIRE) ×1 IMPLANT
KIT ENCORE 26 ADVANTAGE (KITS) ×1 IMPLANT
KIT MICROPUNCTURE NIT STIFF (SHEATH) ×1 IMPLANT
KIT PV (KITS) ×2 IMPLANT
SHEATH PINNACLE 5F 10CM (SHEATH) ×1 IMPLANT
SHEATH PINNACLE ST 7F 45CM (SHEATH) ×1 IMPLANT
SHEATH PROBE COVER 6X72 (BAG) ×1 IMPLANT
SYR MEDRAD MARK 7 150ML (SYRINGE) ×2 IMPLANT
TAPE SHOOT N SEE (TAPE) ×1 IMPLANT
TRANSDUCER W/STOPCOCK (MISCELLANEOUS) ×2 IMPLANT
TRAY PV CATH (CUSTOM PROCEDURE TRAY) ×2 IMPLANT
WIRE HITORQ VERSACORE ST 145CM (WIRE) ×1 IMPLANT
WIRE VERSACORE LOC 115CM (WIRE) ×1 IMPLANT

## 2021-03-23 NOTE — Discharge Instructions (Signed)
Femoral Site Care This sheet gives you information about how to care for yourself after your procedure. Your health care provider may also give you more specific instructions. If you have problems or questions, contact your health care provider. What can I expect after the procedure?  After the procedure, it is common to have: Bruising that usually fades within 1-2 weeks. Tenderness at the site. Follow these instructions at home: Wound care Follow instructions from your health care provider about how to take care of your insertion site. Make sure you: Wash your hands with soap and water before you change your bandage (dressing). If soap and water are not available, use hand sanitizer. Remove your dressing as told by your health care provider. In 24 hours Do not take baths, swim, or use a hot tub until your health care provider approves. You may shower 24-48 hours after the procedure or as told by your health care provider. Gently wash the site with plain soap and water. Pat the area dry with a clean towel. Do not rub the site. This may cause bleeding. Do not apply powder or lotion to the site. Keep the site clean and dry. Check your femoral site every day for signs of infection. Check for: Redness, swelling, or pain. Fluid or blood. Warmth. Pus or a bad smell. Activity For the first 2-3 days after your procedure, or as long as directed: Avoid climbing stairs as much as possible. Do not squat. Do not lift anything that is heavier than 10 lb (4.5 kg), or the limit that you are told, until your health care provider says that it is safe. For 5 days Rest as directed. Avoid sitting for a long time without moving. Get up to take short walks every 1-2 hours. Do not drive for 24 hours if you were given a medicine to help you relax (sedative). General instructions Take over-the-counter and prescription medicines only as told by your health care provider. Keep all follow-up visits as told by  your health care provider. This is important. Contact a health care provider if you have: A fever or chills. You have redness, swelling, or pain around your insertion site. Get help right away if: The catheter insertion area swells very fast. You pass out. You suddenly start to sweat or your skin gets clammy. The catheter insertion area is bleeding, and the bleeding does not stop when you hold steady pressure on the area. The area near or just beyond the catheter insertion site becomes pale, cool, tingly, or numb. These symptoms may represent a serious problem that is an emergency. Do not wait to see if the symptoms will go away. Get medical help right away. Call your local emergency services (911 in the [place] Do not drive yourself to the hospital. Summary After the procedure, it is common to have bruising that usually fades within 1-2 weeks. Check your femoral site every day for signs of infection. Do not lift anything that is heavier than 10 lb (4.5 kg), or the limit that you are told, until your health care provider says that it is safe. This information is not intended to replace advice given to you by your health care provider. Make sure you discuss any questions you have with your health care provider. Document Revised: 02/05/2017 Document Reviewed: 02/05/2017 Elsevier Patient Education  2020 Elsevier Inc. 

## 2021-03-23 NOTE — Interval H&P Note (Signed)
History and Physical Interval Note:  03/23/2021 8:29 AM  Brittany Ford  has presented today for surgery, with the diagnosis of pad.  The various methods of treatment have been discussed with the patient and family. After consideration of risks, benefits and other options for treatment, the patient has consented to  Procedure(s): ABDOMINAL AORTOGRAM W/LOWER EXTREMITY (N/A) as a surgical intervention.  The patient's history has been reviewed, patient examined, no change in status, stable for surgery.  I have reviewed the patient's chart and labs.  Questions were answered to the patient's satisfaction.     Lorine Bears

## 2021-03-24 ENCOUNTER — Encounter (HOSPITAL_COMMUNITY): Payer: Self-pay | Admitting: Cardiovascular Disease

## 2021-03-24 ENCOUNTER — Telehealth: Payer: Self-pay | Admitting: Cardiovascular Disease

## 2021-03-24 NOTE — Telephone Encounter (Signed)
Patient is concerned about taking Eliquis and they added Plavix to her medications yesterday (03/23/21). She expresses concern that she will have another GI bleed like she did in the past if she takes both together.  Will forward concerns to MD for advisement.   Also gave guidance on after care for catheter insertion site.

## 2021-03-24 NOTE — Telephone Encounter (Signed)
Patient had questions regarding the medications that were prescribed to her after her procedure yesterday.  Please advise

## 2021-03-24 NOTE — Telephone Encounter (Signed)
Her GI bleed was a long time ago. Yes, the risk of bleeding is increased when taking both Eliquis and plavix but fortunately, Plavix will be only used for 1 month.  Plavix is needed to keep the artery open in her leg as it works differently from [employer].

## 2021-03-25 NOTE — Telephone Encounter (Signed)
Patient has been called and verbalized her understanding.  

## 2021-03-31 ENCOUNTER — Other Ambulatory Visit: Payer: Self-pay | Admitting: Physical Medicine and Rehabilitation

## 2021-03-31 DIAGNOSIS — M5416 Radiculopathy, lumbar region: Secondary | ICD-10-CM

## 2021-03-31 DIAGNOSIS — M5137 Other intervertebral disc degeneration, lumbosacral region: Secondary | ICD-10-CM

## 2021-03-31 DIAGNOSIS — M48061 Spinal stenosis, lumbar region without neurogenic claudication: Secondary | ICD-10-CM

## 2021-04-11 ENCOUNTER — Other Ambulatory Visit: Payer: Self-pay | Admitting: Physical Medicine and Rehabilitation

## 2021-04-11 DIAGNOSIS — R2689 Other abnormalities of gait and mobility: Secondary | ICD-10-CM

## 2021-04-11 DIAGNOSIS — M25472 Effusion, left ankle: Secondary | ICD-10-CM

## 2021-04-11 DIAGNOSIS — M25471 Effusion, right ankle: Secondary | ICD-10-CM

## 2021-04-11 DIAGNOSIS — S93402A Sprain of unspecified ligament of left ankle, initial encounter: Secondary | ICD-10-CM

## 2021-04-11 DIAGNOSIS — M25572 Pain in left ankle and joints of left foot: Secondary | ICD-10-CM

## 2021-04-12 ENCOUNTER — Ambulatory Visit
Admission: RE | Admit: 2021-04-12 | Discharge: 2021-04-12 | Disposition: A | Discharge status: Home / Self Care | Payer: Medicare Other | Source: Ambulatory Visit | Attending: Physical Medicine and Rehabilitation | Admitting: Physical Medicine and Rehabilitation

## 2021-04-12 ENCOUNTER — Other Ambulatory Visit: Payer: Self-pay | Admitting: Physical Medicine and Rehabilitation

## 2021-04-12 ENCOUNTER — Other Ambulatory Visit: Payer: Self-pay

## 2021-04-12 DIAGNOSIS — M48061 Spinal stenosis, lumbar region without neurogenic claudication: Secondary | ICD-10-CM | POA: Diagnosis not present

## 2021-04-12 DIAGNOSIS — M542 Cervicalgia: Secondary | ICD-10-CM

## 2021-04-12 DIAGNOSIS — M5416 Radiculopathy, lumbar region: Secondary | ICD-10-CM | POA: Diagnosis not present

## 2021-04-12 DIAGNOSIS — R200 Anesthesia of skin: Secondary | ICD-10-CM | POA: Diagnosis not present

## 2021-04-12 DIAGNOSIS — M2578 Osteophyte, vertebrae: Secondary | ICD-10-CM | POA: Diagnosis not present

## 2021-04-12 DIAGNOSIS — M545 Low back pain, unspecified: Secondary | ICD-10-CM | POA: Diagnosis not present

## 2021-04-12 DIAGNOSIS — M5137 Other intervertebral disc degeneration, lumbosacral region: Secondary | ICD-10-CM

## 2021-04-12 DIAGNOSIS — M47816 Spondylosis without myelopathy or radiculopathy, lumbar region: Secondary | ICD-10-CM | POA: Diagnosis not present

## 2021-04-13 ENCOUNTER — Ambulatory Visit (HOSPITAL_COMMUNITY)
Admission: RE | Admit: 2021-04-13 | Discharge: 2021-04-13 | Disposition: A | Discharge status: Home / Self Care | Payer: Medicare Other | Source: Ambulatory Visit | Attending: Cardiovascular Disease | Admitting: Cardiovascular Disease

## 2021-04-13 DIAGNOSIS — I739 Peripheral vascular disease, unspecified: Secondary | ICD-10-CM | POA: Diagnosis not present

## 2021-04-15 NOTE — Addendum Note (Signed)
Addended by: Ricci Barker on: 04/15/2021 12:59 PM   Modules accepted: Orders

## 2021-04-16 ENCOUNTER — Other Ambulatory Visit: Payer: Self-pay | Admitting: Internal Medicine

## 2021-04-24 ENCOUNTER — Ambulatory Visit
Admission: RE | Admit: 2021-04-24 | Discharge: 2021-04-24 | Disposition: A | Discharge status: Home / Self Care | Payer: Medicare Other | Source: Ambulatory Visit | Attending: Physical Medicine and Rehabilitation | Admitting: Physical Medicine and Rehabilitation

## 2021-04-24 ENCOUNTER — Other Ambulatory Visit: Payer: Self-pay

## 2021-04-24 DIAGNOSIS — R2689 Other abnormalities of gait and mobility: Secondary | ICD-10-CM

## 2021-04-24 DIAGNOSIS — S93492A Sprain of other ligament of left ankle, initial encounter: Secondary | ICD-10-CM | POA: Diagnosis not present

## 2021-04-24 DIAGNOSIS — S93402A Sprain of unspecified ligament of left ankle, initial encounter: Secondary | ICD-10-CM

## 2021-04-24 DIAGNOSIS — M25472 Effusion, left ankle: Secondary | ICD-10-CM | POA: Diagnosis not present

## 2021-04-24 DIAGNOSIS — M25572 Pain in left ankle and joints of left foot: Secondary | ICD-10-CM

## 2021-04-24 DIAGNOSIS — M25471 Effusion, right ankle: Secondary | ICD-10-CM

## 2021-04-26 ENCOUNTER — Encounter: Payer: Self-pay | Admitting: Cardiovascular Disease

## 2021-04-26 ENCOUNTER — Ambulatory Visit: Payer: Medicare Other | Admitting: Cardiovascular Disease

## 2021-04-26 ENCOUNTER — Other Ambulatory Visit: Payer: Self-pay

## 2021-04-26 VITALS — BP 140/72 | HR 72 | Ht 71.0 in | Wt 184.8 lb

## 2021-04-26 DIAGNOSIS — I739 Peripheral vascular disease, unspecified: Secondary | ICD-10-CM | POA: Diagnosis not present

## 2021-04-26 DIAGNOSIS — I480 Paroxysmal atrial fibrillation: Secondary | ICD-10-CM

## 2021-04-26 DIAGNOSIS — E785 Hyperlipidemia, unspecified: Secondary | ICD-10-CM | POA: Diagnosis not present

## 2021-04-26 NOTE — Progress Notes (Signed)
?  ?Cardiology Office Note ? ? ?Date:  04/26/2021  ? ?ID:  Brittany Ford, DOB 09-30-1945, MRN [id number] ? ?PCP:  Kathyrn Lass, MD  ?Cardiologist:  Dr. Tamala Julian ? ?No chief complaint on file. ? ? ?  ?History of Present Illness: ?Brittany Ford is a 76 y.o. female who is here today for follow-up visit regarding peripheral arterial disease.    She has history of paroxysmal atrial fibrillation on flecainide, type 2 diabetes, hyperlipidemia with poor tolerance to statins, chronic back pain, GERD and obesity.  She is not a smoker. ?She was seen recently for atypical left leg claudication but her symptoms were felt to be multifactorial overall with contribution from her spine disease and as well arthritis. ? ?She underwent lower extremity arterial Doppler in June which showed an ABI of 0.84 on the right and 0.65 on the left.  Duplex showed moderate SFA disease on the right side and significant proximal/mid SFA disease on the left. ? ?She was initially treated medically for claudication but she returned with worsening left leg symptoms.  ?Thus, I proceeded with angiography last month which showed medium size abdominal aortic aneurysm above the iliac bifurcation, mild to moderate iliac disease bilaterally and severe diffuse SFA disease with one-vessel runoff below the knee via the peroneal artery.  I performed successful directional atherectomy and drug-coated balloon angioplasty to the whole length of the left SFA.  Postprocedure ABI improved to 0.97 on the left with patent left SFA. ? ?She only reports mild improvement in left leg symptoms.  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  She took Plavix for 1 month. ? ? ?Past Medical History:  ?Diagnosis Date  ? Atrial fibrillation ([place])   ? Atrial flutter ([place])   ? Chest pain   ? with typical and atypical qualities  ? Chronic low back pain   ? Diabetes mellitus without complication ([place])   ? Diet Controlled  ?  Dysrhythmia   ? ATRIAL FIBRILATION  ? GERD (gastroesophageal reflux disease)   ? Obesity   ? ? ?Past Surgical History:  ?Procedure Laterality Date  ? ABDOMINAL AORTOGRAM W/LOWER EXTREMITY Left 03/23/2021  ? Procedure: ABDOMINAL AORTOGRAM W/LOWER EXTREMITY;  Surgeon: Wellington Hampshire, MD;  Location: [facility];  Service: Cardiovascular;  Laterality: Left;  ? BREAST EXCISIONAL BIOPSY Right 1990s  ? benign  ? BREAST EXCISIONAL BIOPSY Left 1990s  ? benign  ? COLONOSCOPY  03/01/2012  ? Procedure: COLONOSCOPY;  Surgeon: Missy Sabins, MD;  Location: [facility];  Service: Endoscopy;  Laterality: N/A;  ? ESOPHAGOGASTRODUODENOSCOPY  02/29/2012  ? Procedure: ESOPHAGOGASTRODUODENOSCOPY (EGD);  Surgeon: Missy Sabins, MD;  Location: [facility];  Service: Endoscopy;  Laterality: N/A;  ? FOOT SURGERY    ? x2  ? HYSTEROSCOPY WITH D & C N/A 02/09/2017  ? Procedure: DILATATION AND CURETTAGE /HYSTEROSCOPY;  Surgeon: Christophe Louis, MD;  Location: [facility];  Service: Gynecology;  Laterality: N/A;  w/Myosure for Polyp  ? PERIPHERAL VASCULAR ATHERECTOMY Left 03/23/2021  ? Procedure: PERIPHERAL VASCULAR ATHERECTOMY;  Surgeon: Wellington Hampshire, MD;  Location: [facility];  Service: Cardiovascular;  Laterality: Left;  SFA  ? TONSILLECTOMY    ? ? ? ?Current Outpatient Medications  ?Medication Sig Dispense Refill  ? Accu-Chek FastClix Lancets MISC See admin instructions.    ? ACCU-CHEK GUIDE test strip     ? acetaminophen (TYLENOL) 650 MG CR tablet Take 650-1,300 mg by mouth every 8 (eight)  hours as needed for pain.    ? apixaban (ELIQUIS) 5 MG TABS tablet Take 1 tablet (5 mg total) by mouth 2 (two) times daily. 180 tablet 3  ? Blood Glucose Monitoring Suppl (ACCU-CHEK AVIVA) device by Other route. Use as instructed    ? Cholecalciferol (VITAMIN D) 2000 units CAPS Take 4,000 Units by mouth daily.    ? clopidogrel (PLAVIX) 75 MG tablet Take 1 tablet (75 mg total) by mouth daily. 30 tablet 0  ? flecainide (TAMBOCOR) 50 MG tablet TAKE 1  TABLET BY MOUTH TWICE A DAY 180 tablet 2  ? glipiZIDE (GLUCOTROL) 5 MG tablet TAKE 0.5 TABLETS (2.5 MG TOTAL) BY MOUTH DAILY BEFORE BREAKFAST. 30 tablet 0  ? glucose blood test strip Use to check blood sugars    ? magnesium oxide (MAG-OX) 400 MG tablet Take 400 mg by mouth daily.    ? metFORMIN (GLUCOPHAGE) 500 MG tablet Take 250 mg by mouth 2 (two) times daily.    ? metoprolol tartrate (LOPRESSOR) 25 MG tablet TAKE 1 TABLET BY MOUTH 2 TIMES DAILY. PLEASE KEEP UPCOMING APPOINTMENT FOR FUTURE REFILLS. THANK YOU 180 tablet 2  ? Multiple Vitamin (MULTIVITAMIN WITH MINERALS) TABS tablet Take 1 tablet by mouth in the morning.    ? Zinc 50 MG TABS 1 tablet    ? ?No current facility-administered medications for this visit.  ? ? ?Allergies:   Bee venom, Influenza virus vaccine, Atorvastatin, Crestor [rosuvastatin], Hydrocodone bit-homatrop mbr, and Xarelto [rivaroxaban]  ? ? ?Social History:  The patient  reports that she quit smoking about 15 years ago. Her smoking use included cigarettes. She has a 10.00 pack-year smoking history. She has never used smokeless tobacco. She reports that she does not drink alcohol and does not use drugs.  ? ?Family History:  The patient's family history includes Breast cancer in her mother; COPD in her father; Heart attack in her brother and father; Heart disease in her sister; Lung cancer in her father.  ? ? ?ROS:  Please see the history of present illness.   Otherwise, review of systems are positive for none.   All other systems are reviewed and negative.  ? ? ?PHYSICAL EXAM: ?VS:  BP 140/72   Pulse 72   Ht 5\' 11"  (1.803 m)   Wt 184 lb 12.8 oz (83.8 kg)   SpO2 99%   BMI 25.77 kg/m?  , BMI Body mass index is 25.77 kg/m?. ?GEN: Well nourished, well developed, in no acute distress  ?HEENT: normal  ?Neck: no JVD, carotid bruits, or masses ?Cardiac: RRR; no murmurs, rubs, or gallops,no edema  ?Respiratory:  clear to auscultation bilaterally, normal work of breathing ?GI: soft, nontender,  nondistended, + BS ?MS: no deformity or atrophy  ?Skin: warm and dry, no rash ?Neuro:  Strength and sensation are intact ?Psych: euthymic mood, full affect ?Vascular: Femoral pulse: +2 bilaterally.  No groin hematoma. ? ? ?EKG:  EKG is not ordered today. ? ? ? ?Recent Labs: ?01/25/2021: ALT 6 ?03/08/2021: BUN 17; Creatinine, Ser 1.14; Hemoglobin 10.6; Platelets 313; Potassium 4.3; Sodium 139  ? ? ?Lipid Panel ?   ?Component Value Date/Time  ? CHOL 210 (H) 04/22/2013 2306  ? TRIG 200 (H) 04/22/2013 2306  ? HDL 32 (L) 04/22/2013 2306  ? CHOLHDL 6.6 04/22/2013 2306  ? VLDL 40 04/22/2013 2306  ? [place] 138 (H) 04/22/2013 2306  ? ?  ? ?Wt Readings from Last 3 Encounters:  ?04/26/21 184 lb 12.8 oz (83.8 kg)  ?03/23/21  175 lb (79.4 kg)  ?03/08/21 175 lb 12.8 oz (79.7 kg)  ?  ? ? ? ? ?No flowsheet data found. ? ? ? ?ASSESSMENT AND PLAN: ? ?1.  Peripheral arterial disease: Status post recent successful endovascular intervention on the left SFA.  ABI improved to normal.  Plavix was given for only 1 month and then stop given that she is on anticoagulation with Eliquis.  A lot of her residual symptoms are neuropathic.   ? ?2.  Paroxysmal atrial fibrillation: She is on long-term anticoagulation with Eliquis. ? ?3.  Hyperlipidemia: Her LDL is usually close to 150 and she reports intolerance to rosuvastatin and atorvastatin.  I discussed with her the importance of treating her hyperlipidemia given her recent diagnosis of peripheral arterial disease and her known history of diabetes mellitus.  I offered a trial of PCSK9 inhibitors but the patient is adamant about not trying any cholesterol medications. ? ? ? ?Disposition:   Follow-up with me in 6 months. ? ?Signed, ? ?Kathlyn Sacramento, MD  ?04/26/2021 10:00 AM    ?[place] ? ?

## 2021-04-26 NOTE — Patient Instructions (Signed)
Medication Instructions:  °No changes °*If you need a refill on your cardiac medications before your next appointment, please call your pharmacy* ° ° °Lab Work: °None ordered °If you have labs (blood work) drawn today and your tests are completely normal, you will receive your results only by: °MyChart Message (if you have MyChart) OR °A paper copy in the mail °If you have any lab test that is abnormal or we need to change your treatment, we will call you to review the results. ° ° °Testing/Procedures: °None ordered ° ° °Follow-Up: °At [facility], you and your health needs are our priority.  As part of our continuing mission to provide you with exceptional heart care, we have created designated Provider Care Teams.  These Care Teams include your primary Cardiologist (physician) and Advanced Practice Providers (APPs -  Physician Assistants and Nurse Practitioners) who all work together to provide you with the care you need, when you need it. ° °We recommend signing up for the patient portal called "MyChart".  Sign up information is provided on this After Visit Summary.  MyChart is used to connect with patients for Virtual Visits (Telemedicine).  Patients are able to view lab/test results, encounter notes, upcoming appointments, etc.  Non-urgent messages can be sent to your provider as well.   °To learn more about what you can do with MyChart, go to [email or web address].   ° °Your next appointment:   °6 month(s) ° °The format for your next appointment:   °In Person ° °Provider:   °Dr. Arida ° °

## 2021-04-27 DIAGNOSIS — M25572 Pain in left ankle and joints of left foot: Secondary | ICD-10-CM | POA: Diagnosis not present

## 2021-04-28 ENCOUNTER — Other Ambulatory Visit: Payer: Self-pay | Admitting: Cardiovascular Disease

## 2021-04-28 NOTE — Telephone Encounter (Signed)
Refill request

## 2021-05-17 ENCOUNTER — Telehealth: Payer: Self-pay | Admitting: *Deleted

## 2021-05-17 DIAGNOSIS — M5440 Lumbago with sciatica, unspecified side: Secondary | ICD-10-CM | POA: Diagnosis not present

## 2021-05-17 NOTE — Telephone Encounter (Signed)
? ?  Pre-operative Risk Assessment  ?  ?Patient Name: Brittany Ford  ?DOB: [birth year] ?MRN: [id number]  ? ? ? ?Request for Surgical Clearance   ? ?Procedure:   LUMBAR-TRANSFORAMINAL-L4-RIGHT ? ?Date of Surgery:  Clearance 06/01/21                              ?   ?Surgeon:  DR. DAVE EICHMAN ?Surgeon's Group or Practice Name:  [facility] ?Phone number:  [phone] ?Fax number:  [phone] ?  ?Type of Clearance Requested:   ?- Medical  ?- Pharmacy:  Hold Apixaban (Eliquis) x 3 DAYS PRIOR AND RESUME THE DAY AFTER PROCEDURE ?  ?Type of Anesthesia:  Not Indicated ?  ?Additional requests/questions:   ? ?Signed, ?Danielle Rankin   ?05/17/2021, 12:56 PM  ? ?

## 2021-05-17 NOTE — Telephone Encounter (Signed)
To Pharm D regarding request to hold Eliquis. ? ?Dr. Kirke Corin, you saw the patient in our office on 04/26/2021.  She has a history of PAD, PAF, diabetes, claudication.  She underwent directional arthrectomy and drug-coated balloon angioplasty to the left SFA on 03/23/2021.  Are you agreeable to clear patient for upcoming lumbar transforaminal L4 - right on 06/01/2021?  Please send to response to p cv div preop. ?

## 2021-05-17 NOTE — Telephone Encounter (Signed)
Patient with diagnosis of afib on Eliquis for anticoagulation.   ? ?Procedure: LUMBAR-TRANSFORAMINAL-L4-RIGHT ?Date of procedure: 06/01/21 ? ?CHA2DS2-VASc Score = 6  ?This indicates a 9.7% annual risk of stroke. ?The patient's score is based upon: ?CHF History: 0 ?HTN History: 1 ?Diabetes History: 1 ?Stroke History: 0 ?Vascular Disease History: 1 ?Age Score: 2 ?Gender Score: 1 ?  ?CrCl 8mL/min ?Platelet count 313K ? ?Per office protocol, patient can hold Eliquis for 3 days prior to procedure.   ?

## 2021-05-18 NOTE — Telephone Encounter (Signed)
Yes, that is fine. 

## 2021-05-18 NOTE — Telephone Encounter (Signed)
? ?  Primary Cardiologist: Sinclair Grooms, MD ? ?Chart reviewed as part of pre-operative protocol coverage. Given past medical history and time since last visit, based on ACC/AHA guidelines, Brittany Ford would be at acceptable risk for the planned procedure without further cardiovascular testing.  ? ?Guidelines for holding Eliquis are as follows: ? ? ?CHA2DS2-VASc Score = 6  ?This indicates a 9.7% annual risk of stroke. ?The patient's score is based upon: ?CHF History: 0 ?HTN History: 1 ?Diabetes History: 1 ?Stroke History: 0 ?Vascular Disease History: 1 ?Age Score: 2 ?Gender Score: 1 ?  ?CrCl 5mL/min ?Platelet count 313K ?  ?Per office protocol, patient can hold Eliquis for 3 days prior to procedure.   ? ?I will route this recommendation to the requesting party via Epic fax function and remove from pre-op pool. ? ?Please call with questions. ? ?Emmaline Life, NP-C ? ?  ?05/18/2021, 9:20 AM ?[place] ?[id number], [address] ?Office [phone] Fax [phone] ? ?

## 2021-06-01 DIAGNOSIS — M5416 Radiculopathy, lumbar region: Secondary | ICD-10-CM | POA: Diagnosis not present

## 2021-06-02 ENCOUNTER — Telehealth: Payer: Self-pay | Admitting: Interventional Cardiology

## 2021-06-02 NOTE — Telephone Encounter (Signed)
Hey Lynn, LPN, can you please advise on this matter? Thanks  ?

## 2021-06-02 NOTE — Telephone Encounter (Signed)
Patient calling the office for samples of medication: ? ? ?1.  What medication and dosage are you requesting samples for? apixaban (ELIQUIS) 5 MG TABS tablet ? ?2.  Are you currently out of this medication? Patient is almost out of medication. Needs enough until she get her medication from [facility].  ?  ?

## 2021-06-02 NOTE — Telephone Encounter (Signed)
**Note De-Identified  Obfuscation** The pt states that she has her BMSPAF application completed and her required documents and plans to drop it off in the front office tomorrow. ? ?She is advised that we will leave her 2 weeks of Eliquis 5mg  samples in the front office so she can pick them up while dropping off her BMSPAF application for Eliquis asst. ? ?She thanked me for our assistance. ?

## 2021-06-02 NOTE — Telephone Encounter (Signed)
Leaving pt 2 boxes of Eliquis 5 mg tablets at the front desk for pt to pick up.  Lot# [id number]  Exp: 04/2023 ?

## 2021-06-09 ENCOUNTER — Telehealth: Payer: Self-pay

## 2021-06-09 NOTE — Telephone Encounter (Signed)
Pt brought by completed Eliquis Pt Asst application with requested documentation on 06/03/21. ?I have completed the Provider page and placed in Dr [name] box for signature.  ?Fax cover sheet attached with it.  ?

## 2021-06-15 NOTE — Telephone Encounter (Signed)
**Note De-Identified  Obfuscation** Letter received  fax from [facility] stating that they denied the pt Eliquis assistance. ?Reason: ?Documentation of 3% out of pocket RX expenses, based on household adjusted gross income, not met. ? ?The letter states that they have notified the pt of this denial as well. ?

## 2021-06-27 ENCOUNTER — Other Ambulatory Visit: Payer: Self-pay | Admitting: Internal Medicine

## 2021-07-18 ENCOUNTER — Other Ambulatory Visit: Payer: Self-pay | Admitting: Interventional Cardiology

## 2021-07-31 NOTE — Progress Notes (Deleted)
Cardiology Office Note:    Date:  07/31/2021   ID:  Brittany Ford, DOB 10/23/1945, MRN [id number]  PCP:  Sigmund Hazel, MD   [facility] Providers Cardiologist:  [name], MD {  Referring MD: Sigmund Hazel, MD     History of Present Illness:    Brittany Ford is a 76 y.o. female with a hx of pAfib on flecainide, DMII, HLD with statin intolerance, chronic back pain, GERD, obesity and PVD followed by Dr. Kirke Corin who presents to clinic for follow-up.  Patient was last seen by Dr. Kirke Corin on 04/26/21. She had been seen earlier in 02/2021 where she was having significant claudication symptoms. She underwent angiography which showed medium size abdominal aortic aneurysm above the iliac bifurcation, mild to moderate iliac disease bilaterally and severe diffuse SFA disease with one-vessel runoff below the knee via the peroneal artery.  She underwent successful directional atherectomy and drug-coated balloon angioplasty to the whole length of the left SFA.  Postprocedure ABI improved to 0.97 on the left with patent left SFA. At her follow-up visit on 04/2021, the patient reported only mild improvement in LLE symptoms.   Today, ***  Past Medical History:  Diagnosis Date   Atrial fibrillation (HCC)    Atrial flutter (HCC)    Chest pain    with typical and atypical qualities   Chronic low back pain    Diabetes mellitus without complication (HCC)    Diet Controlled   Dysrhythmia    ATRIAL FIBRILATION   GERD (gastroesophageal reflux disease)    Obesity     Past Surgical History:  Procedure Laterality Date   ABDOMINAL AORTOGRAM W/LOWER EXTREMITY Left 03/23/2021   Procedure: ABDOMINAL AORTOGRAM W/LOWER EXTREMITY;  Surgeon: Iran Ouch, MD;  Location: [facility] INVASIVE CV [facility];  Service: Cardiovascular;  Laterality: Left;   BREAST EXCISIONAL BIOPSY Right 1990s   benign   BREAST EXCISIONAL BIOPSY Left 1990s   benign   COLONOSCOPY  03/01/2012   Procedure: COLONOSCOPY;   Surgeon: [name], MD;  Location: [facility] ENDOSCOPY;  Service: Endoscopy;  Laterality: N/A;   ESOPHAGOGASTRODUODENOSCOPY  02/29/2012   Procedure: ESOPHAGOGASTRODUODENOSCOPY (EGD);  Surgeon: [name], MD;  Location: [facility] ENDOSCOPY;  Service: Endoscopy;  Laterality: N/A;   FOOT SURGERY     x2   HYSTEROSCOPY WITH D & C N/A 02/09/2017   Procedure: DILATATION AND CURETTAGE /HYSTEROSCOPY;  Surgeon: Gerald Leitz, MD;  Location: [facility] ORS;  Service: Gynecology;  Laterality: N/A;  w/Myosure for Polyp   PERIPHERAL VASCULAR ATHERECTOMY Left 03/23/2021   Procedure: PERIPHERAL VASCULAR ATHERECTOMY;  Surgeon: Iran Ouch, MD;  Location: [facility] INVASIVE CV [facility];  Service: Cardiovascular;  Laterality: Left;  SFA   TONSILLECTOMY      Current Medications: No outpatient medications have been marked as taking for the 08/08/21 encounter (Appointment) with Meriam Sprague, MD.     Allergies:   Bee venom, Influenza virus vaccine, Atorvastatin, Crestor [rosuvastatin], Hydrocodone bit-homatrop mbr, and Xarelto [rivaroxaban]   Social History   Socioeconomic History   Marital status: Widowed    Spouse name: Not on file   Number of children: 1   Years of education: Not on file   Highest education level: Not on file  Occupational History   Not on file  Tobacco Use   Smoking status: Former    Packs/day: 0.50    Years: 20.00    Total pack years: 10.00    Types: Cigarettes    Quit date: 07/20/2005  Years since quitting: 16.0   Smokeless tobacco: Never  Vaping Use   Vaping Use: Never used  Substance and Sexual Activity   Alcohol use: No   Drug use: No   Sexual activity: Not on file  Other Topics Concern   Not on file  Social History Narrative   Not on file   Social Determinants of Health   Financial Resource Strain: Not on file  Food Insecurity: Not on file  Transportation Needs: Not on file  Physical Activity: Not on file  Stress: Not on file  Social Connections: Not on file     Family  History: The patient's ***family history includes Breast cancer in her mother; COPD in her father; Heart attack in her brother and father; Heart disease in her sister; Lung cancer in her father.  ROS:   Please see the history of present illness.    *** All other systems reviewed and are negative.  EKGs/Labs/Other Studies Reviewed:    The following studies were reviewed today: Aortogram 03/23/21: 1.  Medium size abdominal aortic aneurysm above the iliac bifurcation 2.  Mild to moderate iliac disease bilaterally. 3.  Left lower extremity: Severe diffuse SFA disease with one-vessel runoff below the knee via the peroneal artery with reconstitution of the anterior tibial and posterior tibial distally. 4.  Successful directional atherectomy and drug-coated balloon angioplasty to the whole length of the left SFA.   Recommendations: Resume Eliquis tomorrow as long as no bleeding issues from the right groin. Use clopidogrel 75 mg once daily for 1 month only.  No aspirin due to increased risk of bleeding on triple therapy. The patient is not on statin therapy as she is intolerant to statins and refused other therapies.  TTE 12/2018: IMPRESSIONS     1. Left ventricular ejection fraction, by visual estimation, is 60 to  65%. There is mildly increased left ventricular hypertrophy. GLS -20%,  normal.   2. The left ventricle has no regional wall motion abnormalities.   3. Left ventricular diastolic parameters are consistent with Grade I  diastolic dysfunction (impaired relaxation).   4. The aortic valve is tricuspid. Aortic valve regurgitation is trivial.  Mild aortic valve sclerosis without stenosis.   5. The mitral valve is normal in structure. Trace mitral valve  regurgitation. No evidence of mitral stenosis.   6. The tricuspid valve is normal in structure. Tricuspid valve  regurgitation is trivial.   7. Global right ventricle has normal systolic function.The right  ventricular size is  normal. No increase in right ventricular wall  thickness.   8. Left atrial size was normal.   9. Right atrial size was normal.  10. The inferior vena cava is normal in size with greater than 50%  respiratory variability, suggesting right atrial pressure of 3 mmHg.  11. The tricuspid regurgitant velocity is 2.69 m/s, and with an assumed  right atrial pressure of 3 mmHg, the estimated right ventricular systolic  pressure is mildly elevated at 31.9 mmHg.  12. Trivial pericardial effusion is present.   In comparison to the previous echocardiogram(s): Report only 03/08/12 EF  79%.   EKG:  EKG is *** ordered today.  The ekg ordered today demonstrates ***  Recent Labs: 01/25/2021: ALT 6 03/08/2021: BUN 17; Creatinine, Ser 1.14; Hemoglobin 10.6; Platelets 313; Potassium 4.3; Sodium 139  Recent Lipid Panel    Component Value Date/Time   CHOL 210 (H) 04/22/2013 2306   TRIG 200 (H) 04/22/2013 2306   HDL 32 (L) 04/22/2013 2306  CHOLHDL 6.6 04/22/2013 2306   VLDL 40 04/22/2013 2306   LDLCALC 138 (H) 04/22/2013 2306     Risk Assessment/Calculations:   {Does this patient have ATRIAL FIBRILLATION?:[phone]}       Physical Exam:    VS:  There were no vitals taken for this visit.    Wt Readings from Last 3 Encounters:  04/26/21 184 lb 12.8 oz (83.8 kg)  03/23/21 175 lb (79.4 kg)  03/08/21 175 lb 12.8 oz (79.7 kg)     GEN: *** Well nourished, well developed in no acute distress HEENT: Normal NECK: No JVD; No carotid bruits LYMPHATICS: No lymphadenopathy CARDIAC: ***RRR, no murmurs, rubs, gallops RESPIRATORY:  Clear to auscultation without rales, wheezing or rhonchi  ABDOMEN: Soft, non-tender, non-distended MUSCULOSKELETAL:  No edema; No deformity  SKIN: Warm and dry NEUROLOGIC:  Alert and oriented x 3 PSYCHIATRIC:  Normal affect   ASSESSMENT:    No diagnosis found. PLAN:    In order of problems listed above:  #Paroxysmal Afib: Maintaining NSR on flecainide. On AC with  apixaban.  #PAD: Followed by Dr. Kirke Corin. S/p successful endovascular intervention on the left SFA on 03/2021.  ABI improved to normal.  Completed 1 month plavix and then transitioned to apixaban monotherapy. Residual pain thought to be neuropathic.  #HLD: Intolerant to statins. Declined PCSK9i. High risk of recurrent disease. -Declined all lipid lowering therapies      {Are you ordering a CV Procedure (e.g. stress test, cath, DCCV, TEE, etc)?   Press F2        :[id number]}    Medication Adjustments/Labs and Tests Ordered: Current medicines are reviewed at length with the patient today.  Concerns regarding medicines are outlined above.  No orders of the defined types were placed in this encounter.  No orders of the defined types were placed in this encounter.   There are no Patient Instructions on file for this visit.   Signed, Meriam Sprague, MD  07/31/2021 4:04 PM    [facility]

## 2021-08-08 ENCOUNTER — Ambulatory Visit: Payer: Medicare Other | Admitting: Cardiology

## 2021-08-08 ENCOUNTER — Encounter: Payer: Self-pay | Admitting: Cardiology

## 2021-08-08 VITALS — BP 148/76 | HR 64 | Ht 71.0 in | Wt 188.2 lb

## 2021-08-08 DIAGNOSIS — G720 Drug-induced myopathy: Secondary | ICD-10-CM | POA: Diagnosis not present

## 2021-08-08 DIAGNOSIS — I480 Paroxysmal atrial fibrillation: Secondary | ICD-10-CM

## 2021-08-08 DIAGNOSIS — I739 Peripheral vascular disease, unspecified: Secondary | ICD-10-CM | POA: Diagnosis not present

## 2021-08-08 DIAGNOSIS — M79605 Pain in left leg: Secondary | ICD-10-CM | POA: Diagnosis not present

## 2021-08-08 DIAGNOSIS — Z7901 Long term (current) use of anticoagulants: Secondary | ICD-10-CM

## 2021-08-08 DIAGNOSIS — I10 Essential (primary) hypertension: Secondary | ICD-10-CM | POA: Diagnosis not present

## 2021-08-08 DIAGNOSIS — T466X5D Adverse effect of antihyperlipidemic and antiarteriosclerotic drugs, subsequent encounter: Secondary | ICD-10-CM

## 2021-08-08 DIAGNOSIS — E785 Hyperlipidemia, unspecified: Secondary | ICD-10-CM | POA: Diagnosis not present

## 2021-08-08 MED ORDER — APIXABAN 5 MG PO TABS: 5.0000 mg | ORAL_TABLET | Freq: Two times a day (BID) | ORAL | 3 refills | Status: DC

## 2021-08-08 NOTE — Patient Instructions (Addendum)
Medication Instructions:  Your physician recommends that you continue on your current medications as directed. Please refer to the Current Medication list given to you today. REFILLED: Eliquis 5 mg by mouth twice daily. We have provided you with a week's worth of Eliquis 5 mg samples   *If you need a refill on your cardiac medications before your next appointment, please call your pharmacy*   Lab Work: NONE If you have labs (blood work) drawn today and your tests are completely normal, you will receive your results only by: MyChart Message (if you have MyChart) OR A paper copy in the mail If you have any lab test that is abnormal or we need to change your treatment, we will call you to review the results.   Testing/Procedures: Your physician has referred you to see an Electrophysiologist(EP) Specialist.  Your physician has referred you to see Terrilee Files, MD Sports Medicine at [facility].    Follow-Up: At [facility], you and your health needs are our priority.  As part of our continuing mission to provide you with exceptional heart care, we have created designated Provider Care Teams.  These Care Teams include your primary Cardiologist (physician) and Advanced Practice Providers (APPs -  Physician Assistants and Nurse Practitioners) who all work together to provide you with the care you need, when you need it.  We recommend signing up for the patient portal called "MyChart".  Sign up information is provided on this After Visit Summary.  MyChart is used to connect with patients for Virtual Visits (Telemedicine).  Patients are able to view lab/test results, encounter notes, upcoming appointments, etc.  Non-urgent messages can be sent to your provider as well.   To learn more about what you can do with MyChart, go to [email or web address].    Your next appointment:   6 month(s)  The format for your next appointment:   In Person  Provider:   [name], MD     Important  Information About Sugar

## 2021-08-08 NOTE — Progress Notes (Signed)
Cardiology Office Note:    Date:  08/08/2021   ID:  Maelie Chriswell, DOB 04-Feb-1946, MRN [id number]  PCP:  Sigmund Hazel, MD   [facility] Providers Cardiologist:  [name], MD {  Referring MD: Sigmund Hazel, MD   History of Present Illness:    Kaelan Emami is a 76 y.o. female with a hx of pAfib on flecainide, DMII, HLD with statin intolerance, chronic back pain, GERD, obesity and PVD followed by Dr. Kirke Corin who presents to clinic for follow-up.  Patient was last seen by Dr. Kirke Corin on 04/26/21. She had been seen earlier in 02/2021 where she was having significant claudication symptoms. She underwent angiography which showed medium size abdominal aortic aneurysm above the iliac bifurcation, mild to moderate iliac disease bilaterally and severe diffuse SFA disease with one-vessel runoff below the knee via the peroneal artery.  She underwent successful directional atherectomy and drug-coated balloon angioplasty to the whole length of the left SFA.  Postprocedure ABI improved to 0.97 on the left with patent left SFA. At her follow-up visit on 04/2021, the patient reported only mild improvement in LLE symptoms.   Today, the patient states that she is continuing to have difficulty with her blood thinner. She complains of anemia and constant chills with associated rhinorrhea. She denies any bleeding issues on Eliquis; however, it has become cost prohibitive for her this year. Her patient assistance was denied. She is interested in discussing the option of a Watchman procedure further.  In clinic today her blood pressure is 148/76. She would attribute this to white coat hypertension. She states she would like to minimize medications and prefers more natural remedies.  She continues to have coldness in her bilateral feet even after her atherectomy. No significant claudication symptoms or LE wounds.  She denies any palpitations, chest pain, or shortness of breath. No lightheadedness,  headaches, syncope, orthopnea, or PND.   Past Medical History:  Diagnosis Date   Atrial fibrillation (HCC)    Atrial flutter (HCC)    Chest pain    with typical and atypical qualities   Chronic low back pain    Diabetes mellitus without complication (HCC)    Diet Controlled   Dysrhythmia    ATRIAL FIBRILATION   GERD (gastroesophageal reflux disease)    Obesity     Past Surgical History:  Procedure Laterality Date   ABDOMINAL AORTOGRAM W/LOWER EXTREMITY Left 03/23/2021   Procedure: ABDOMINAL AORTOGRAM W/LOWER EXTREMITY;  Surgeon: Iran Ouch, MD;  Location: [facility] INVASIVE CV LAB;  Service: Cardiovascular;  Laterality: Left;   BREAST EXCISIONAL BIOPSY Right 1990s   benign   BREAST EXCISIONAL BIOPSY Left 1990s   benign   COLONOSCOPY  03/01/2012   Procedure: COLONOSCOPY;  Surgeon: [name], MD;  Location: [facility] ENDOSCOPY;  Service: Endoscopy;  Laterality: N/A;   ESOPHAGOGASTRODUODENOSCOPY  02/29/2012   Procedure: ESOPHAGOGASTRODUODENOSCOPY (EGD);  Surgeon: [name], MD;  Location: [facility] ENDOSCOPY;  Service: Endoscopy;  Laterality: N/A;   FOOT SURGERY     x2   HYSTEROSCOPY WITH D & C N/A 02/09/2017   Procedure: DILATATION AND CURETTAGE /HYSTEROSCOPY;  Surgeon: Gerald Leitz, MD;  Location: [facility] ORS;  Service: Gynecology;  Laterality: N/A;  w/Myosure for Polyp   PERIPHERAL VASCULAR ATHERECTOMY Left 03/23/2021   Procedure: PERIPHERAL VASCULAR ATHERECTOMY;  Surgeon: Iran Ouch, MD;  Location: [facility] INVAS[facility] CV LAB;  Service: Cardiovascular;  Laterality: Left;  SFA   TONSILLECTOMY      Current Medications: Current Meds  Medication  Sig   Accu-Chek FastClix Lancets MISC See admin instructions.   ACCU-CHEK GUIDE test strip    acetaminophen (TYLENOL) 650 MG CR tablet Take 650-1,300 mg by mouth every 8 (eight) hours as needed for pain.   Blood Glucose Monitoring Suppl (ACCU-CHEK AVIVA) device by Other route. Use as instructed   Cholecalciferol (VITAMIN D) 2000 units CAPS Take 4,000  Units by mouth daily.   flecainide (TAMBOCOR) 50 MG tablet TAKE 1 TABLET BY MOUTH TWICE A DAY   glipiZIDE (GLUCOTROL) 5 MG tablet TAKE 0.5 TABLETS (2.5 MG TOTAL) BY MOUTH DAILY BEFORE BREAKFAST.   glucose blood test strip Use to check blood sugars   magnesium oxide (MAG-OX) 400 MG tablet Take 400 mg by mouth daily.   metFORMIN (GLUCOPHAGE) 500 MG tablet Take 250 mg by mouth 2 (two) times daily.   metoprolol tartrate (LOPRESSOR) 25 MG tablet TAKE 1 TABLET BY MOUTH 2 TIMES DAILY.   Multiple Vitamin (MULTIVITAMIN WITH MINERALS) TABS tablet Take 1 tablet by mouth in the morning.   Zinc 50 MG TABS 1 tablet   [DISCONTINUED] apixaban (ELIQUIS) 5 MG TABS tablet Take 1 tablet (5 mg total) by mouth 2 (two) times daily.     Allergies:   Bee venom, Influenza virus vaccine, Atorvastatin, Crestor [rosuvastatin], Hydrocodone bit-homatrop mbr, and Xarelto [rivaroxaban]   Social History   Socioeconomic History   Marital status: Widowed    Spouse name: Not on file   Number of children: 1   Years of education: Not on file   Highest education level: Not on file  Occupational History   Not on file  Tobacco Use   Smoking status: Former    Packs/day: 0.50    Years: 20.00    Total pack years: 10.00    Types: Cigarettes    Quit date: 07/20/2005    Years since quitting: 16.0   Smokeless tobacco: Never  Vaping Use   Vaping Use: Never used  Substance and Sexual Activity   Alcohol use: No   Drug use: No   Sexual activity: Not on file  Other Topics Concern   Not on file  Social History Narrative   Not on file   Social Determinants of Health   Financial Resource Strain: Not on file  Food Insecurity: Not on file  Transportation Needs: Not on file  Physical Activity: Not on file  Stress: Not on file  Social Connections: Not on file     Family History: The patient's family history includes Breast cancer in her mother; COPD in her father; Heart attack in her brother and father; Heart disease in  her sister; Lung cancer in her father.  ROS:   Review of Systems  Constitutional:  Positive for chills. Negative for fever.  HENT:  Negative for tinnitus.   Eyes:  Negative for pain.  Respiratory:  Negative for hemoptysis, sputum production and stridor.   Cardiovascular:  Positive for leg swelling (Bilateral feet). Negative for chest pain, palpitations, orthopnea and claudication.  Gastrointestinal:  Negative for nausea and vomiting.  Genitourinary:  Negative for dysuria.  Musculoskeletal:  Positive for back pain (chronic) and falls.  Neurological:  Negative for seizures and headaches.  Psychiatric/Behavioral:  Negative for depression and substance abuse.     EKGs/Labs/Other Studies Reviewed:    The following studies were reviewed today:  LE Arterial Doppler 04/13/2021: Summary:  Left: Marked improvement is noted compared to previous study.   Heterogenous plaque throughout.  The entire left SFA is widely patent without evidence of  stenosis, s/p atherectomy and angioplasty.   ABI Doppler 04/13/2021: Left ABIs appear increased compared to prior study on 07/21/2020. Bilateral TBIs and right ABIs appear essentially unchanged compared to prior study on 07/21/2020.     Summary:  Right: Resting right ankle-brachial index indicates mild right lower extremity arterial disease. The right toe-brachial index is abnormal.   Left: Resting left ankle-brachial index is within normal range. No evidence of significant left lower extremity arterial disease. The left toe-brachial index is abnormal.   Aortogram 03/23/21: 1.  Medium size abdominal aortic aneurysm above the iliac bifurcation. 2.  Mild to moderate iliac disease bilaterally. 3.  Left lower extremity: Severe diffuse SFA disease with one-vessel runoff below the knee via the peroneal artery with reconstitution of the anterior tibial and posterior tibial distally. 4.  Successful directional atherectomy and drug-coated balloon angioplasty to the  whole length of the left SFA.   Recommendations: Resume Eliquis tomorrow as long as no bleeding issues from the right groin. Use clopidogrel 75 mg once daily for 1 month only.  No aspirin due to increased risk of bleeding on triple therapy. The patient is not on statin therapy as she is intolerant to statins and refused other therapies.  TTE 12/2018: IMPRESSIONS    1. Left ventricular ejection fraction, by visual estimation, is 60 to 65%. There is mildly increased left ventricular hypertrophy. GLS -20%, normal.   2. The left ventricle has no regional wall motion abnormalities.   3. Left ventricular diastolic parameters are consistent with Grade I diastolic dysfunction (impaired relaxation).   4. The aortic valve is tricuspid. Aortic valve regurgitation is trivial. Mild aortic valve sclerosis without stenosis.   5. The mitral valve is normal in structure. Trace mitral valve regurgitation. No evidence of mitral stenosis.   6. The tricuspid valve is normal in structure. Tricuspid valve regurgitation is trivial.   7. Global right ventricle has normal systolic function.The right ventricular size is normal. No increase in right ventricular wall thickness.   8. Left atrial size was normal.   9. Right atrial size was normal.  10. The inferior vena cava is normal in size with greater than 50% respiratory variability, suggesting right atrial pressure of 3 mmHg.  11. The tricuspid regurgitant velocity is 2.69 m/s, and with an assumed right atrial pressure of 3 mmHg, the estimated right ventricular systolic pressure is mildly elevated at 31.9 mmHg.  12. Trivial pericardial effusion is present.   In comparison to the previous echocardiogram(s): Report only 03/08/12 EF 79%.   EKG:  EKG is personally reviewed. 08/08/2021:  EKG was not ordered. 03/08/2021 (Dr. Fletcher Anon):  sinus bradycardia with sinus arrhythmia.   Recent Labs: 01/25/2021: ALT 6 03/08/2021: BUN 17; Creatinine, Ser 1.14; Hemoglobin 10.6;  Platelets 313; Potassium 4.3; Sodium 139   Recent Lipid Panel    Component Value Date/Time   CHOL 210 (H) 04/22/2013 2306   TRIG 200 (H) 04/22/2013 2306   HDL 32 (L) 04/22/2013 2306   CHOLHDL 6.6 04/22/2013 2306   VLDL 40 04/22/2013 2306   LDLCALC 138 (H) 04/22/2013 2306     Risk Assessment/Calculations:    CHA2DS2-VASc Score = 6   This indicates a 9.7% annual risk of stroke. The patient's score is based upon: CHF History: 0 HTN History: 1 Diabetes History: 1 Stroke History: 0 Vascular Disease History: 1 Age Score: 2 Gender Score: 1         Physical Exam:    VS:  BP (!) 148/76   Pulse 64  Ht 5\' 11"  (1.803 m)   Wt 188 lb 3.2 oz (85.4 kg)   SpO2 97%   BMI 26.25 kg/m     Wt Readings from Last 3 Encounters:  08/08/21 188 lb 3.2 oz (85.4 kg)  04/26/21 184 lb 12.8 oz (83.8 kg)  03/23/21 175 lb (79.4 kg)     GEN: Well nourished, well developed in no acute distress HEENT: Normal NECK: No JVD; No carotid bruits CARDIAC: RRR, no murmurs, rubs, gallops RESPIRATORY:  Clear to auscultation without rales, wheezing or rhonchi  ABDOMEN: Soft, non-tender, non-distended MUSCULOSKELETAL:  Trace pedal edema, warm SKIN: Warm and dry NEUROLOGIC:  Alert and oriented x 3 PSYCHIATRIC:  Normal affect   ASSESSMENT:    1. PAF (paroxysmal atrial fibrillation) (HCC)   2. Pain of left lower extremity   3. PAF (paroxysmal atrial fibrillation)/atrial flutter   4. PAD (peripheral artery disease) ([place])   5. Hypertension, unspecified type   6. Chronic anticoagulation   7. Hyperlipidemia, unspecified hyperlipidemia type   8. Statin myopathy    PLAN:    In order of problems listed above:  #Paroxysmal Afib: Maintaining NSR on flecainide. On AC with apixaban. She states she is having a hard time affording apixaban and was denied for patient assistance. We discussed the importance of continuing at this time to prevent stroke and samples provided today. Also sent a message to help  with patient assistance forms. Patient expressed interest in the Watchman procedure and will refer to EP as well at this time. -Continue apixaban 5mg  BID for stroke prevention -Samples provided today and will look into patient assistance forms further -Refer to EP for consideration of Watchman procedure as patient would like to come off [facility] if possible -Continue flecainide 50mg  BID and metop 25mg  BID   #PAD: Followed by Dr. Fletcher Anon. S/p successful endovascular intervention on the left SFA on 03/2021.  ABI improved to normal.  Completed 1 month plavix and then transitioned to apixaban monotherapy. Residual pain thought to be neuropathic. -Off plavix due to need for apixaban -Follow-up with Dr. Fletcher Anon as scheduled  #HLD: Intolerant to statins. Declined PCSK9i. High risk of recurrent disease. -Declined all lipid lowering therapies and prefers natural remedies  #HTN: States her blood pressure is always elevated at the MD. Declines further therapy at this time.  -Continue metop 25mg  PO BID  #Left foot pain: Patient reports recent fall with tear of a ligament in her foot. Hoping to see a Sports Medicine MD. -Refer to Dr. Tamala Julian  Patient requested to continue to follow with Pernell Dupre, MD as she has a long-standing relationship with him.  I stated that I am happy to see her if Dr. Tamala Julian does not have availability or if she needs an urgent visit.         Follow-up:  6 months.  Medication Adjustments/Labs and Tests Ordered: Current medicines are reviewed at length with the patient today.  Concerns regarding medicines are outlined above.   Orders Placed This Encounter  Procedures   Ambulatory referral to Cardiac Electrophysiology   AMB referral to sports medicine   Meds ordered this encounter  Medications   apixaban (ELIQUIS) 5 MG TABS tablet    Sig: Take 1 tablet (5 mg total) by mouth 2 (two) times daily.    Dispense:  180 tablet    Refill:  3    3 month supply, will evaluate at next  office appt   Patient Instructions  Medication Instructions:  Your physician recommends that you  continue on your current medications as directed. Please refer to the Current Medication list given to you today. REFILLED: Eliquis 5 mg by mouth twice daily. We have provided you with a week's worth of Eliquis 5 mg samples   *If you need a refill on your cardiac medications before your next appointment, please call your pharmacy*   Lab Work: NONE If you have labs (blood work) drawn today and your tests are completely normal, you will receive your results only by: [place] (if you have MyChart) OR A paper copy in the mail If you have any lab test that is abnormal or we need to change your treatment, we will call you to review the results.   Testing/Procedures: Your physician has referred you to see an Electrophysiologist(EP) Specialist.  Your physician has referred you to see Charlann Boxer, MD Sports Medicine at [facility].    Follow-Up: At [facility], you and your health needs are our priority.  As part of our continuing mission to provide you with exceptional heart care, we have created designated Provider Care Teams.  These Care Teams include your primary Cardiologist (physician) and Advanced Practice Providers (APPs -  Physician Assistants and Nurse Practitioners) who all work together to provide you with the care you need, when you need it.  We recommend signing up for the patient portal called "MyChart".  Sign up information is provided on this After Visit Summary.  MyChart is used to connect with patients for Virtual Visits (Telemedicine).  Patients are able to view lab/test results, encounter notes, upcoming appointments, etc.  Non-urgent messages can be sent to your provider as well.   To learn more about what you can do with MyChart, go to [email or web address].    Your next appointment:   6 month(s)  The format for your next appointment:   In Person  Provider:    Sinclair Grooms, MD     Important Information About Sugar         I,Mathew Stumpf,acting as a scribe for Freada Bergeron, MD.,have documented all relevant documentation on the behalf of Freada Bergeron, MD,as directed by  Freada Bergeron, MD while in the presence of Freada Bergeron, MD.  I, Freada Bergeron, MD, have reviewed all documentation for this visit. The documentation on 08/08/21 for the exam, diagnosis, procedures, and orders are all accurate and complete.   Signed, Freada Bergeron, MD  08/08/2021 10:50 AM    [place]

## 2021-08-12 NOTE — Telephone Encounter (Signed)
**Note De-Identified  Obfuscation** Per a request from Dr Johney Frame I called the pt to discuss her Eliquis cost.  She was denied for Eliquis asst through [facility] in May for not meeting her 3% out of pocket requirement.  She states that she is not sure if she has met that requirement or not as she does not know what her 3% amount is.  I advised her to call [employer] to ask what her 3% oop amount is and that if she has met it, to get a RX expense report from her pharmacy and bring it to the office to drop off and we will fax it to [facility] for her.  She thanked me for calling her to discuss.

## 2021-08-15 NOTE — Telephone Encounter (Signed)
Patient has been approved for patient assistance for Eliquis. Patient made aware. Gave her their phone # so she can call and set up shipment and confirm they have her correct address.

## 2021-08-17 ENCOUNTER — Encounter: Payer: Self-pay | Admitting: Family Medicine

## 2021-08-25 ENCOUNTER — Other Ambulatory Visit: Payer: Self-pay | Admitting: Interventional Cardiology

## 2021-09-01 DIAGNOSIS — M5440 Lumbago with sciatica, unspecified side: Secondary | ICD-10-CM | POA: Diagnosis not present

## 2021-09-01 DIAGNOSIS — M47819 Spondylosis without myelopathy or radiculopathy, site unspecified: Secondary | ICD-10-CM | POA: Diagnosis not present

## 2021-09-28 ENCOUNTER — Telehealth: Payer: Self-pay | Admitting: *Deleted

## 2021-09-28 NOTE — Patient Outreach (Signed)
  Care Coordination   Initial Visit Note   09/28/2021 Name: Brittany Ford MRN: [id number] DOB: 1945-12-18  Brittany Ford is a 76 y.o. year old female who sees Sigmund Hazel, MD for primary care. I spoke with  Carlus Pavlov by phone today  RN discussed services [facility] services, RN, SW, and Pharmacist. Patient declined services.    Goals Addressed   None     SDOH assessments and interventions completed:  No     Care Coordination Interventions Activated:  No  Care Coordination Interventions:  No, not indicated   Follow up plan: No further intervention required.   Encounter Outcome:  Pt. Visit Completed  Gean Maidens BSN RN [facility] [phone]

## 2021-09-30 ENCOUNTER — Institutional Professional Consult (permissible substitution): Payer: Medicare Other | Admitting: Cardiology

## 2021-10-03 ENCOUNTER — Telehealth: Payer: Self-pay

## 2021-10-03 NOTE — Telephone Encounter (Signed)
The patient cancelled her recent Watchman consult with Dr. Lalla Brothers. Called to reschedule, but she does not wish to reschedule at this time.  Informed her she will be called in several weeks if she does not call before then. She was grateful for call and agrees with plan.

## 2021-10-17 DIAGNOSIS — D6869 Other thrombophilia: Secondary | ICD-10-CM | POA: Diagnosis not present

## 2021-10-17 DIAGNOSIS — G4733 Obstructive sleep apnea (adult) (pediatric): Secondary | ICD-10-CM | POA: Diagnosis not present

## 2021-10-17 DIAGNOSIS — N1832 Chronic kidney disease, stage 3b: Secondary | ICD-10-CM | POA: Diagnosis not present

## 2021-10-17 DIAGNOSIS — I482 Chronic atrial fibrillation, unspecified: Secondary | ICD-10-CM | POA: Diagnosis not present

## 2021-10-17 DIAGNOSIS — I509 Heart failure, unspecified: Secondary | ICD-10-CM | POA: Diagnosis not present

## 2021-10-17 DIAGNOSIS — I739 Peripheral vascular disease, unspecified: Secondary | ICD-10-CM | POA: Diagnosis not present

## 2021-10-17 DIAGNOSIS — E1151 Type 2 diabetes mellitus with diabetic peripheral angiopathy without gangrene: Secondary | ICD-10-CM | POA: Diagnosis not present

## 2021-10-17 DIAGNOSIS — Z79899 Other long term (current) drug therapy: Secondary | ICD-10-CM | POA: Diagnosis not present

## 2021-10-17 DIAGNOSIS — I110 Hypertensive heart disease with heart failure: Secondary | ICD-10-CM | POA: Diagnosis not present

## 2021-10-17 DIAGNOSIS — D649 Anemia, unspecified: Secondary | ICD-10-CM | POA: Diagnosis not present

## 2021-10-17 DIAGNOSIS — J439 Emphysema, unspecified: Secondary | ICD-10-CM | POA: Diagnosis not present

## 2021-10-24 DIAGNOSIS — M4307 Spondylolysis, lumbosacral region: Secondary | ICD-10-CM | POA: Diagnosis not present

## 2021-10-24 DIAGNOSIS — Z0001 Encounter for general adult medical examination with abnormal findings: Secondary | ICD-10-CM | POA: Diagnosis not present

## 2021-10-24 DIAGNOSIS — E1151 Type 2 diabetes mellitus with diabetic peripheral angiopathy without gangrene: Secondary | ICD-10-CM | POA: Diagnosis not present

## 2021-10-24 DIAGNOSIS — M79651 Pain in right thigh: Secondary | ICD-10-CM | POA: Diagnosis not present

## 2021-10-24 DIAGNOSIS — I110 Hypertensive heart disease with heart failure: Secondary | ICD-10-CM | POA: Diagnosis not present

## 2021-10-24 DIAGNOSIS — M25551 Pain in right hip: Secondary | ICD-10-CM | POA: Diagnosis not present

## 2021-10-24 DIAGNOSIS — N1832 Chronic kidney disease, stage 3b: Secondary | ICD-10-CM | POA: Diagnosis not present

## 2021-10-26 ENCOUNTER — Ambulatory Visit
Admission: RE | Admit: 2021-10-26 | Discharge: 2021-10-26 | Disposition: A | Discharge status: Home / Self Care | Payer: Medicare Other | Source: Ambulatory Visit | Attending: Family Medicine | Admitting: Family Medicine

## 2021-10-26 ENCOUNTER — Other Ambulatory Visit: Payer: Self-pay | Admitting: Family Medicine

## 2021-10-26 DIAGNOSIS — M79651 Pain in right thigh: Secondary | ICD-10-CM

## 2021-11-04 ENCOUNTER — Other Ambulatory Visit (HOSPITAL_COMMUNITY): Payer: Self-pay | Admitting: Family Medicine

## 2021-11-04 ENCOUNTER — Ambulatory Visit (HOSPITAL_COMMUNITY): Payer: Medicare Other

## 2021-11-04 DIAGNOSIS — R296 Repeated falls: Secondary | ICD-10-CM | POA: Diagnosis not present

## 2021-11-04 DIAGNOSIS — M25551 Pain in right hip: Secondary | ICD-10-CM

## 2021-11-04 DIAGNOSIS — M79651 Pain in right thigh: Secondary | ICD-10-CM

## 2021-11-04 DIAGNOSIS — M87051 Idiopathic aseptic necrosis of right femur: Secondary | ICD-10-CM | POA: Diagnosis not present

## 2021-11-14 NOTE — Telephone Encounter (Addendum)
Left message to call back for an update (to schedule Watchman consult or close referral).  The patient has appointments with Dr. Fletcher Anon 11/28 and Dr. Tamala Julian 12/19. If she does not call back, will get updates at those evaluations.

## 2021-12-05 ENCOUNTER — Emergency Department (HOSPITAL_BASED_OUTPATIENT_CLINIC_OR_DEPARTMENT_OTHER): Payer: Medicare Other

## 2021-12-05 ENCOUNTER — Encounter (HOSPITAL_BASED_OUTPATIENT_CLINIC_OR_DEPARTMENT_OTHER): Payer: Self-pay | Admitting: Emergency Medicine

## 2021-12-05 ENCOUNTER — Emergency Department (HOSPITAL_BASED_OUTPATIENT_CLINIC_OR_DEPARTMENT_OTHER)
Admission: EM | Admit: 2021-12-05 | Discharge: 2021-12-05 | Disposition: A | Discharge status: Home / Self Care | Payer: Medicare Other | Attending: Emergency Medicine | Admitting: Emergency Medicine

## 2021-12-05 ENCOUNTER — Emergency Department (HOSPITAL_BASED_OUTPATIENT_CLINIC_OR_DEPARTMENT_OTHER): Payer: Medicare Other | Admitting: Radiology

## 2021-12-05 ENCOUNTER — Other Ambulatory Visit: Payer: Self-pay

## 2021-12-05 DIAGNOSIS — Z7901 Long term (current) use of anticoagulants: Secondary | ICD-10-CM | POA: Diagnosis not present

## 2021-12-05 DIAGNOSIS — M5412 Radiculopathy, cervical region: Secondary | ICD-10-CM | POA: Diagnosis not present

## 2021-12-05 DIAGNOSIS — E039 Hypothyroidism, unspecified: Secondary | ICD-10-CM | POA: Insufficient documentation

## 2021-12-05 DIAGNOSIS — Z7984 Long term (current) use of oral hypoglycemic drugs: Secondary | ICD-10-CM | POA: Diagnosis not present

## 2021-12-05 DIAGNOSIS — M792 Neuralgia and neuritis, unspecified: Secondary | ICD-10-CM

## 2021-12-05 DIAGNOSIS — I10 Essential (primary) hypertension: Secondary | ICD-10-CM | POA: Insufficient documentation

## 2021-12-05 DIAGNOSIS — M79603 Pain in arm, unspecified: Secondary | ICD-10-CM | POA: Diagnosis present

## 2021-12-05 DIAGNOSIS — J45909 Unspecified asthma, uncomplicated: Secondary | ICD-10-CM | POA: Insufficient documentation

## 2021-12-05 DIAGNOSIS — N179 Acute kidney failure, unspecified: Secondary | ICD-10-CM | POA: Diagnosis not present

## 2021-12-05 DIAGNOSIS — E119 Type 2 diabetes mellitus without complications: Secondary | ICD-10-CM | POA: Insufficient documentation

## 2021-12-05 DIAGNOSIS — M542 Cervicalgia: Secondary | ICD-10-CM

## 2021-12-05 LAB — TROPONIN I (HIGH SENSITIVITY)
Troponin I (High Sensitivity): 6 ng/L (ref <18)
Troponin I (High Sensitivity): 7 ng/L (ref <18)

## 2021-12-05 LAB — CBC WITH DIFFERENTIAL/PLATELET
Abs Immature Granulocytes: 0.04 10*3/uL (ref 0.00–0.07)
Basophils Absolute: 0.1 10*3/uL (ref 0.0–0.1)
Basophils Relative: 1 %
Eosinophils Absolute: 0.4 10*3/uL (ref 0.0–0.5)
Eosinophils Relative: 4 %
HCT: 31.1 % — ABNORMAL LOW (ref 36.0–46.0)
Hemoglobin: 9.5 g/dL — ABNORMAL LOW (ref 12.0–15.0)
Immature Granulocytes: 1 %
Lymphocytes Relative: 18 %
Lymphs Abs: 1.5 10*3/uL (ref 0.7–4.0)
MCH: 26.1 pg (ref 26.0–34.0)
MCHC: 30.5 g/dL (ref 30.0–36.0)
MCV: 85.4 fL (ref 80.0–100.0)
Monocytes Absolute: 0.6 10*3/uL (ref 0.1–1.0)
Monocytes Relative: 7 %
Neutro Abs: 5.7 10*3/uL (ref 1.7–7.7)
Neutrophils Relative %: 69 %
Platelets: 329 10*3/uL (ref 150–400)
RBC: 3.64 MIL/uL — ABNORMAL LOW (ref 3.87–5.11)
RDW: 14.6 % (ref 11.5–15.5)
WBC: 8.2 10*3/uL (ref 4.0–10.5)
nRBC: 0 % (ref 0.0–0.2)

## 2021-12-05 LAB — BASIC METABOLIC PANEL
Anion gap: 7 (ref 5–15)
BUN: 32 mg/dL — ABNORMAL HIGH (ref 8–23)
CO2: 24 mmol/L (ref 22–32)
Calcium: 9.1 mg/dL (ref 8.9–10.3)
Chloride: 106 mmol/L (ref 98–111)
Creatinine, Ser: 1.88 mg/dL — ABNORMAL HIGH (ref 0.44–1.00)
GFR, Estimated: 28 mL/min — ABNORMAL LOW (ref >60)
Glucose, Bld: 148 mg/dL — ABNORMAL HIGH (ref 70–99)
Potassium: 4.4 mmol/L (ref 3.5–5.1)
Sodium: 137 mmol/L (ref 135–145)

## 2021-12-05 MED ORDER — OXYCODONE-ACETAMINOPHEN 5-325 MG PO TABS
1.0000 | ORAL_TABLET | Freq: Once | ORAL | Status: AC
  Administered 2021-12-05: 1 via ORAL
  Filled 2021-12-05: qty 1

## 2021-12-05 MED ORDER — METHYLPREDNISOLONE 4 MG PO TBPK: ORAL_TABLET | ORAL | 0 refills | Status: DC

## 2021-12-05 MED ORDER — OXYCODONE-ACETAMINOPHEN 5-325 MG PO TABS: 1.0000 | ORAL_TABLET | ORAL | 0 refills | Status: DC | PRN

## 2021-12-05 MED ORDER — FENTANYL CITRATE PF 50 MCG/ML IJ SOSY
50.0000 ug | PREFILLED_SYRINGE | Freq: Once | INTRAMUSCULAR | Status: AC
  Administered 2021-12-05: 50 ug via INTRAVENOUS
  Filled 2021-12-05: qty 1

## 2021-12-05 NOTE — ED Notes (Signed)
Patient verbalizes understanding of discharge instructions. Opportunity for questioning and answers were provided. Patient discharged from ED.  °

## 2021-12-05 NOTE — ED Triage Notes (Signed)
Pt arrives to ED with c/o left arm pain that started yesterday. Pt reports the pain is stabbing and feels like pins and needles. The pain is constant.

## 2021-12-05 NOTE — ED Provider Notes (Signed)
[facility] EMERGENCY DEPT Provider Note   CSN: [id number] Arrival date & time: 12/05/21  3235     History  Chief Complaint  Patient presents with   Arm Pain    Laurelin Elson is a 76 y.o. female.  The history is provided by the patient and medical records. No language interpreter was used.  Arm Pain This is a new problem. The current episode started yesterday. The problem occurs constantly. The problem has not changed since onset.Pertinent negatives include no chest pain, no abdominal pain, no headaches and no shortness of breath. Nothing aggravates the symptoms. Nothing relieves the symptoms. She has tried nothing for the symptoms. The treatment provided no relief.       Home Medications Prior to Admission medications   Medication Sig Start Date End Date Taking? Authorizing Provider  Accu-Chek FastClix Lancets MISC See admin instructions. 10/05/17   [provider]  ACCU-CHEK GUIDE test strip  11/21/19   [provider]  acetaminophen (TYLENOL) 650 MG CR tablet Take 650-1,300 mg by mouth every 8 (eight) hours as needed for pain.    [provider]  apixaban (ELIQUIS) 5 MG TABS tablet Take 1 tablet (5 mg total) by mouth 2 (two) times daily. 08/08/21   Meriam Sprague, MD  Blood Glucose Monitoring Suppl (ACCU-CHEK AVIVA) device by Other route. Use as instructed    [provider]  Cholecalciferol (VITAMIN D) 2000 units CAPS Take 4,000 Units by mouth daily.    [provider]  flecainide (TAMBOCOR) 50 MG tablet TAKE 1 TABLET BY MOUTH TWICE A DAY 08/26/21   Lyn Records, MD  glipiZIDE (GLUCOTROL) 5 MG tablet TAKE 0.5 TABLETS (2.5 MG TOTAL) BY MOUTH DAILY BEFORE BREAKFAST. 04/18/21   Shamleffer, Konrad Dolores, MD  glucose blood test strip Use to check blood sugars 07/12/12   [provider]  magnesium oxide (MAG-OX) 400 MG tablet Take 400 mg by mouth daily.    [provider]  metFORMIN  (GLUCOPHAGE) 500 MG tablet Take 250 mg by mouth 2 (two) times daily. 05/27/19   [provider]  metoprolol tartrate (LOPRESSOR) 25 MG tablet TAKE 1 TABLET BY MOUTH TWICE A DAY 08/26/21   Lyn Records, MD  Multiple Vitamin (MULTIVITAMIN WITH MINERALS) TABS tablet Take 1 tablet by mouth in the morning.    [provider]  Zinc 50 MG TABS 1 tablet    [provider]      Allergies    Bee venom, Influenza virus vaccine, Atorvastatin, Crestor [rosuvastatin], Hydrocodone bit-homatrop mbr, and Xarelto [rivaroxaban]    Review of Systems   Review of Systems  Constitutional:  Negative for chills, fatigue and fever.  HENT:  Negative for congestion.   Eyes:  Negative for visual disturbance.  Respiratory:  Negative for cough, chest tightness, shortness of breath and wheezing.   Cardiovascular:  Negative for chest pain.  Gastrointestinal:  Negative for abdominal pain, constipation, diarrhea, nausea and vomiting.  Genitourinary:  Negative for dysuria and flank pain.  Musculoskeletal:  Positive for neck pain. Negative for back pain and neck stiffness.  Skin:  Negative for rash and wound.  Neurological:  Negative for dizziness, facial asymmetry, weakness, light-headedness, numbness (tinglign present) and headaches.  All other systems reviewed and are negative.   Physical Exam Updated Vital Signs BP (!) 151/72 (BP Location: Right Arm)   Pulse 66   Temp 98 F (36.7 C)   Resp 14   Ht 5\' 11"  (1.803 m)   Wt  85.3 kg   SpO2 98%   BMI 26.22 kg/m  Physical Exam Vitals and nursing note reviewed.  Constitutional:      General: She is not in acute distress.    Appearance: She is well-developed. She is not ill-appearing, toxic-appearing or diaphoretic.  HENT:     Head: Normocephalic and atraumatic.     Nose: Nose normal.     Mouth/Throat:     Mouth: Mucous membranes are moist.  Eyes:     Extraocular Movements: Extraocular movements intact.     Conjunctiva/sclera:  Conjunctivae normal.     Pupils: Pupils are equal, round, and reactive to light.  Cardiovascular:     Rate and Rhythm: Normal rate and regular rhythm.     Heart sounds: No murmur heard. Pulmonary:     Effort: Pulmonary effort is normal. No respiratory distress.     Breath sounds: Normal breath sounds. No wheezing, rhonchi or rales.  Chest:     Chest wall: No tenderness.  Abdominal:     General: Abdomen is flat.     Palpations: Abdomen is soft.     Tenderness: There is no abdominal tenderness. There is no right CVA tenderness, left CVA tenderness, guarding or rebound.  Musculoskeletal:        General: Tenderness present. No swelling.     Cervical back: Neck supple. Tenderness present. No rigidity.     Right lower leg: No edema.     Left lower leg: No edema.  Skin:    General: Skin is warm and dry.     Capillary Refill: Capillary refill takes less than 2 seconds.     Findings: No erythema or rash.  Neurological:     General: No focal deficit present.     Mental Status: She is alert.     Sensory: No sensory deficit.     Motor: No weakness.  Psychiatric:        Mood and Affect: Mood normal.     ED Results / Procedures / Treatments   Labs (all labs ordered are listed, but only abnormal results are displayed) Labs Reviewed  CBC WITH DIFFERENTIAL/PLATELET - Abnormal; Notable for the following components:      Result Value   RBC 3.64 (*)    Hemoglobin 9.5 (*)    HCT 31.1 (*)    All other components within normal limits  BASIC METABOLIC PANEL - Abnormal; Notable for the following components:   Glucose, Bld 148 (*)    BUN 32 (*)    Creatinine, Ser 1.88 (*)    GFR, Estimated 28 (*)    All other components within normal limits  TROPONIN I (HIGH SENSITIVITY)  TROPONIN I (HIGH SENSITIVITY)    EKG EKG Interpretation  Date/Time:  Monday December 05 2021 07:50:53 EDT Ventricular Rate:  67 PR Interval:  204 QRS Duration: 76 QT Interval:  410 QTC Calculation: 433 R  Axis:   73 Text Interpretation: Normal sinus rhythm Normal ECG When compared with ECG of 05-Apr-2017 11:46, No significant change was found when compared to prior, similar to prior ECGs. No STEMI Confirmed by Antony Blackbird [phone]) on 12/05/2021 9:25:04 AM  Radiology CT Cervical Spine Wo Contrast  Result Date: 12/05/2021 CLINICAL DATA:  Cervical radiculopathy, prior cervical surgery. Previous neck surgery with pain going down left arm with tingling coming from neck. Spurling positive. Rule out acute changes in cervical spine. EXAM: CT CERVICAL SPINE WITHOUT CONTRAST TECHNIQUE: Multidetector CT imaging of the cervical spine was performed without intravenous contrast.  Multiplanar CT image reconstructions were also generated. RADIATION DOSE REDUCTION: This exam was performed according to the departmental dose-optimization program which includes automated exposure control, adjustment of the mA and/or kV according to patient size and/or use of iterative reconstruction technique. COMPARISON:  Cervical spine radiographs 04/12/2021. Cervical spine MRI 08/15/2020. FINDINGS: Alignment: Straightening of the normal cervical lordosis. No significant listhesis. Skull base and vertebrae: No acute fracture or suspicious osseous lesion. C4-5 and C5-6 ACDF with a small amount of bridging bone across the C5-6 disc space anteriorly and without any solid bridging bone being evident at C4-5. Soft tissues and spinal canal: No prevertebral fluid or swelling. No visible canal hematoma. Disc levels: C2-3: Broad, partially calcified central disc protrusion results in mild-to-moderate spinal stenosis, similar to the prior MRI. No significant neural foraminal stenosis. C3-4: Left eccentric disc bulging and uncovertebral spurring result in mild-to-moderate spinal stenosis and mild-to-moderate left neural foraminal stenosis, similar to the prior MRI. C4-5: Interval ACDF since the prior MRI. At most mild residual spinal and neural  foraminal stenosis. C5-6: Interval ACDF since the prior MRI. Broad-based posterior disc osteophyte complex and posterior longitudinal ligament ossification result in mild to moderate residual left-sided spinal stenosis at the C6 vertebral body level and mild-to-moderate bilateral neural foraminal stenosis. C6-7: Degenerative fusion across the disc space. A broad-based posterior disc osteophyte complex results in mild spinal stenosis and mild left greater than right neural foraminal stenosis, similar to the prior MRI. C7-T1: Disc bulging, uncovertebral spurring, and a possible chronic right foraminal disc protrusion result in chronic right neural foraminal stenosis which is suboptimally evaluated by CT. Grossly patent spinal canal and left neural foramen. Upper chest: Paraseptal and centrilobular emphysema and mild scarring in the lung apices. Other: Mild carotid atherosclerosis. IMPRESSION: 1. No acute osseous abnormality. 2. C4-5 and C5-6 ACDF. Minimal bridging bone at C5-6 and none at C4-5. 3. Mild-to-moderate multilevel spinal and neural foraminal stenosis as above. 4.  Emphysema (ICD10-J43.9). Electronically Signed   By: Sebastian AcheAllen  Grady M.D.   On: 12/05/2021 11:24   DG Chest 2 View  Result Date: 12/05/2021 CLINICAL DATA:  Left arm tingling EXAM: CHEST - 2 VIEW COMPARISON:  09/21/2020 FINDINGS: The heart size and mediastinal contours are within normal limits. Both lungs are clear. The visualized skeletal structures are unremarkable. IMPRESSION: No active cardiopulmonary disease. Electronically Signed   By: Elige KoHetal  Patel M.D.   On: 12/05/2021 10:24    Procedures Procedures    Medications Ordered in ED Medications  fentaNYL (SUBLIMAZE) injection 50 mcg (50 mcg Intravenous Given 12/05/21 1144)  oxyCODONE-acetaminophen (PERCOCET/ROXICET) 5-325 MG per tablet 1 tablet (1 tablet Oral Given 12/05/21 1448)    ED Course/ Medical Decision Making/ A&P                           Medical Decision Making Amount  and/or Complexity of Data Reviewed Labs: ordered. Radiology: ordered.  Risk Prescription drug management.    Carlus PavlovDianne Bethea-Wilson is a 76 y.o. female with a past medical history significant for hypertension, hyperlipidemia, diabetes, atrial fibrillation/flutter on Eliquis, previous cervical spine surgery 2 years ago, hypothyroidism, degenerative disease, asthma, aortic aneurysm, and peripheral vascular disease who presents with pain and tingling in her left arm.  Patient reports that she had read about arm pain and tingling be related to cardiac disease and wanted to get checked out to make sure she does not have a heart attack.  She reports no history of other cardiac disease.  She reports the pain started yesterday in the afternoon but then improved but then this morning it started again.  It is a soreness and throbbing in her left arm going to her neck.  Is associated with tingling/mild numbness but is not persistent numbness.  She denies any weakness.  Denies any history of this.  She does report she had neck surgery before and the pain does go towards her left neck.  She denies any actual chest pain or chest pressure and denies any new shortness of breath.  She reports chronic shortness of breath that is unchanged from baseline.  She reports some soreness in her legs that is also been chronic and denies any abdominal pain, nausea, or vomiting.  Denies any back pain or flank pain otherwise.  Denies any neck injury or trauma.  Denies any recent chiropractor use, massage, or neck manipulation.  She reports the pain is a 6 out of 10 in severity down from an 8 out of 10 initially.  On exam, lungs clear and chest nontender.  No murmur.  Abdomen nontender.  Mid and low back nontender.  Left neck is slightly tender to palpation and Spurling was positive reproducing her tingling and pain going down the left arm when I pushed on her head.  Midline neck was nontender.  Intact sensation, strength, and pulses in  upper extremities.  Hoffmann negative in hand for myelopathy.  Chest nontender.  Exam otherwise unremarkable.  EKG appeared similar to prior.  She is in sinus rhythm and there was no STEMI.  Clinically I suspect patient has some radicular pain coming from her left neck going down her arm that is new.  Given her history of cervical spine surgery 2 years ago, we will get a CT to look for acute abnormality or change.  However given the patient's age, comorbidities, and the pain going down the left arm with some tingling, we will also get cardiac enzymes and chest x-ray and labs as her primary concern today was ruling out a cardiac cause of symptoms.  If work-up is reassuring, anticipate initiation of some steroids and having her follow-up with her neck doctor to discuss outpatient imaging and further management.  Patient agrees with this plan.  CT scan showed hardware appears intact with some degenerative changes.  No acute fracture seen.  Troponin negative x2 and rest of exam only revealed evidence of AKI.  We discussed giving her IV fluids but patient was able to tolerate p.o. and rather stay hydrated at home.  She will call her PCP to get seen in the neck several days.  Clinically have low suspicion of her discomfort being cardiac given her reassuring cardiac enzymes and similar EKG however I suspect this is a radicular pain from the neck given the.  Patient will be given a prescription for Medrol Dosepak as well as pain medicine and will follow-up with her neck doctor.  Patient agrees and understands return precautions.  She will also follow-up with PCP for the AKI.  She had no other questions or concerns and was discharged in good condition.        Final Clinical Impression(s) / ED Diagnoses Final diagnoses:  Radicular pain in left arm  Neck pain on left side  AKI (acute kidney injury) (HCC)    Rx / DC Orders ED Discharge Orders          Ordered    oxyCODONE-acetaminophen  (PERCOCET/ROXICET) 5-325 MG tablet  Every 4 hours PRN  12/05/21 1508    methylPREDNISolone (MEDROL DOSEPAK) 4 MG TBPK tablet        12/05/21 1508            Clinical Impression: 1. Radicular pain in left arm   2. Neck pain on left side   3. AKI (acute kidney injury) (HCC)     Disposition: Discharge  Condition: Good  I have discussed the results, Dx and Tx plan with the pt(& family if present). He/she/they expressed understanding and agree(s) with the plan. Discharge instructions discussed at great length. Strict return precautions discussed and pt &/or family have verbalized understanding of the instructions. No further questions at time of discharge.    Discharge Medication List as of 12/05/2021  3:09 PM     START taking these medications   Details  methylPREDNISolone (MEDROL DOSEPAK) 4 MG TBPK tablet Please follow directions on Dosepak, Normal    oxyCODONE-acetaminophen (PERCOCET/ROXICET) 5-325 MG tablet Take 1 tablet by mouth every 4 (four) hours as needed for severe pain., Starting Mon 12/05/2021, Normal        Follow Up: your neck doctor     Sigmund Hazel, MD [address] [place] [address] [phone]         , Canary Brim, MD 12/05/21 [phone]

## 2021-12-05 NOTE — Discharge Instructions (Signed)
Your history, exam, evaluation today revealed pain going from her neck down her arm with a tingling related to suspected nerve pain.  As she had previous neck surgery who did a CT scan that shows intact hardware and no acute fractures.  We do want you to follow-up with your neck doctor but please take the steroid taper and pain medicine to help with symptoms.  Please rest and stay hydrated.  Your kidney function was also increased from prior as we discussed but given your otherwise well appearance we did not feel he needs to be admitted and you agreed.  Please see your PCP for recheck of your kidney function and push hydration.  If any symptoms change or worsen acutely, please return to the nearest Emergency Department.

## 2022-01-03 ENCOUNTER — Ambulatory Visit: Payer: Medicare Other | Admitting: Cardiovascular Disease

## 2022-01-24 ENCOUNTER — Ambulatory Visit: Payer: Medicare Other | Admitting: Interventional Cardiology

## 2022-02-21 ENCOUNTER — Ambulatory Visit: Payer: Medicare Other | Attending: Cardiovascular Disease | Admitting: Cardiovascular Disease

## 2022-02-21 NOTE — Progress Notes (Deleted)
Cardiology Office Note   Date:  02/21/2022   ID:  Brittany Ford, DOB 05/02/1945, MRN [id number]  PCP:  Kathyrn Lass, MD  Cardiologist:  Dr. Tamala Julian  No chief complaint on file.     History of Present Illness: Brittany Ford is a 77 y.o. female who is here today for follow-up visit regarding peripheral arterial disease.    She has history of paroxysmal atrial fibrillation on flecainide, type 2 diabetes, hyperlipidemia with poor tolerance to statins, chronic back pain, GERD and obesity.  She is not a smoker. She was seen recently for atypical left leg claudication but her symptoms were felt to be multifactorial overall with contribution from her spine disease and as well arthritis.  She underwent lower extremity arterial Doppler in June which showed an ABI of 0.84 on the right and 0.65 on the left.  Duplex showed moderate SFA disease on the right side and significant proximal/mid SFA disease on the left.  She was initially treated medically for claudication but she returned with worsening left leg symptoms.  Thus, I proceeded with angiography last month which showed medium size abdominal aortic aneurysm above the iliac bifurcation, mild to moderate iliac disease bilaterally and severe diffuse SFA disease with one-vessel runoff below the knee via the peroneal artery.  I performed successful directional atherectomy and drug-coated balloon angioplasty to the whole length of the left SFA.  Postprocedure ABI improved to 0.97 on the left with patent left SFA.  She only reports mild improvement in left leg symptoms.  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  She took Plavix for 1 month.   Past Medical History:  Diagnosis Date   Atrial fibrillation (HCC)    Atrial flutter (HCC)    Chest pain    with typical and atypical qualities   Chronic low back pain    Diabetes mellitus without complication (HCC)    Diet Controlled    Dysrhythmia    ATRIAL FIBRILATION   GERD (gastroesophageal reflux disease)    Obesity     Past Surgical History:  Procedure Laterality Date   ABDOMINAL AORTOGRAM W/LOWER EXTREMITY Left 03/23/2021   Procedure: ABDOMINAL AORTOGRAM W/LOWER EXTREMITY;  Surgeon: Wellington Hampshire, MD;  Location: [facility];  Service: Cardiovascular;  Laterality: Left;   BREAST EXCISIONAL BIOPSY Right 1990s   benign   BREAST EXCISIONAL BIOPSY Left 1990s   benign   COLONOSCOPY  03/01/2012   Procedure: COLONOSCOPY;  Surgeon: Missy Sabins, MD;  Location: [facility];  Service: Endoscopy;  Laterality: N/A;   ESOPHAGOGASTRODUODENOSCOPY  02/29/2012   Procedure: ESOPHAGOGASTRODUODENOSCOPY (EGD);  Surgeon: Missy Sabins, MD;  Location: [facility];  Service: Endoscopy;  Laterality: N/A;   FOOT SURGERY     x2   HYSTEROSCOPY WITH D & C N/A 02/09/2017   Procedure: DILATATION AND CURETTAGE /HYSTEROSCOPY;  Surgeon: Christophe Louis, MD;  Location: [facility];  Service: Gynecology;  Laterality: N/A;  w/Myosure for Polyp   PERIPHERAL VASCULAR ATHERECTOMY Left 03/23/2021   Procedure: PERIPHERAL VASCULAR ATHERECTOMY;  Surgeon: Wellington Hampshire, MD;  Location: [facility];  Service: Cardiovascular;  Laterality: Left;  SFA   TONSILLECTOMY       Current Outpatient Medications  Medication Sig Dispense Refill   Accu-Chek FastClix Lancets MISC See admin instructions.     ACCU-CHEK GUIDE test strip      acetaminophen (TYLENOL) 650 MG CR tablet Take 650-1,300 mg by mouth every 8 (eight)  hours as needed for pain.     apixaban (ELIQUIS) 5 MG TABS tablet Take 1 tablet (5 mg total) by mouth 2 (two) times daily. 180 tablet 3   Blood Glucose Monitoring Suppl (ACCU-CHEK AVIVA) device by Other route. Use as instructed     Cholecalciferol (VITAMIN D) 2000 units CAPS Take 4,000 Units by mouth daily.     ferrous sulfate 325 (65 FE) MG tablet Take 325 mg by mouth daily.     flecainide (TAMBOCOR) 50 MG tablet TAKE 1 TABLET BY MOUTH TWICE  A DAY 60 tablet 6   glipiZIDE (GLUCOTROL) 5 MG tablet TAKE 0.5 TABLETS (2.5 MG TOTAL) BY MOUTH DAILY BEFORE BREAKFAST. 30 tablet 0   glucose blood test strip Use to check blood sugars     lisinopril-hydrochlorothiazide (ZESTORETIC) 10-12.5 MG tablet Take 1 tablet by mouth daily.     magnesium oxide (MAG-OX) 400 MG tablet Take 400 mg by mouth daily.     metFORMIN (GLUCOPHAGE) 500 MG tablet Take 250 mg by mouth 2 (two) times daily.     methylPREDNISolone (MEDROL DOSEPAK) 4 MG TBPK tablet Please follow directions on Dosepak 21 each 0   metoprolol tartrate (LOPRESSOR) 25 MG tablet TAKE 1 TABLET BY MOUTH TWICE A DAY 60 tablet 6   Multiple Vitamin (MULTIVITAMIN WITH MINERALS) TABS tablet Take 1 tablet by mouth in the morning.     oxyCODONE-acetaminophen (PERCOCET/ROXICET) 5-325 MG tablet Take 1 tablet by mouth every 4 (four) hours as needed for severe pain. 15 tablet 0   traMADol (ULTRAM) 50 MG tablet Take 100 mg by mouth 2 (two) times daily.     Zinc 50 MG TABS 1 tablet     No current facility-administered medications for this visit.    Allergies:   Bee venom, Influenza virus vaccine, Atorvastatin, Crestor [rosuvastatin], Hydrocodone bit-homatrop mbr, and Xarelto [rivaroxaban]    Social History:  The patient  reports that she quit smoking about 16 years ago. Her smoking use included cigarettes. She has a 10.00 pack-year smoking history. She has never used smokeless tobacco. She reports that she does not drink alcohol and does not use drugs.   Family History:  The patient's family history includes Breast cancer in her mother; COPD in her father; Heart attack in her brother and father; Heart disease in her sister; Lung cancer in her father.    ROS:  Please see the history of present illness.   Otherwise, review of systems are positive for none.   All other systems are reviewed and negative.    PHYSICAL EXAM: VS:  There were no vitals taken for this visit. , BMI There is no height or weight on  file to calculate BMI. GEN: Well nourished, well developed, in no acute distress  HEENT: normal  Neck: no JVD, carotid bruits, or masses Cardiac: RRR; no murmurs, rubs, or gallops,no edema  Respiratory:  clear to auscultation bilaterally, normal work of breathing GI: soft, nontender, nondistended, + BS MS: no deformity or atrophy  Skin: warm and dry, no rash Neuro:  Strength and sensation are intact Psych: euthymic mood, full affect Vascular: Femoral pulse: +2 bilaterally.  No groin hematoma.   EKG:  EKG is not ordered today.    Recent Labs: 12/05/2021: BUN 32; Creatinine, Ser 1.88; Hemoglobin 9.5; Platelets 329; Potassium 4.4; Sodium 137    Lipid Panel    Component Value Date/Time   CHOL 210 (H) 04/22/2013 2306   TRIG 200 (H) 04/22/2013 2306   HDL 32 (L) 04/22/2013  2306   CHOLHDL 6.6 04/22/2013 2306   VLDL 40 04/22/2013 2306   LDLCALC 138 (H) 04/22/2013 2306      Wt Readings from Last 3 Encounters:  12/05/21 188 lb (85.3 kg)  08/08/21 188 lb 3.2 oz (85.4 kg)  04/26/21 184 lb 12.8 oz (83.8 kg)            No data to display            ASSESSMENT AND PLAN:  1.  Peripheral arterial disease: Status post recent successful endovascular intervention on the left SFA.  ABI improved to normal.  Plavix was given for only 1 month and then stop given that she is on anticoagulation with Eliquis.  A lot of her residual symptoms are neuropathic.    2.  Paroxysmal atrial fibrillation: She is on long-term anticoagulation with Eliquis.  3.  Hyperlipidemia: Her LDL is usually close to 150 and she reports intolerance to rosuvastatin and atorvastatin.  I discussed with her the importance of treating her hyperlipidemia given her recent diagnosis of peripheral arterial disease and her known history of diabetes mellitus.  I offered a trial of PCSK9 inhibitors but the patient is adamant about not trying any cholesterol medications.    Disposition:   Follow-up with me in 6  months.  Signed,  Kathlyn Sacramento, MD  02/21/2022 9:49 AM    [place]

## 2022-02-21 NOTE — Telephone Encounter (Signed)
The patient no-showed to her visit with Dr. Fletcher Anon. She was given the [employer] number months ago with instruction to call if she wanted to proceed.

## 2022-04-07 ENCOUNTER — Telehealth: Payer: Self-pay | Admitting: Cardiology

## 2022-04-07 NOTE — Telephone Encounter (Signed)
Please advise. Thanks.  

## 2022-04-07 NOTE — Telephone Encounter (Signed)
Patient calling the office for samples of medication:   1.  What medication and dosage are you requesting samples for? apixaban (ELIQUIS) 5 MG TABS tablet   2.  Are you currently out of this medication? Not yet.   She states she forgot to fill out [facility] paperwork, she states she will bring it by next week, but she needs samples in the meantime.

## 2022-04-10 NOTE — Telephone Encounter (Signed)
**Note De-Identified  Obfuscation** The pt states that she has her [employer]MS[employer] application for Eliquis assitance ready and that ran out of Eliquis last night.  I asked her if she can bring the application to [place] and she stated that she will ASAP.  She is aware that we are leaving her 2 weeks of Eliquis 5 mg in the front office for her to pick up when she drops off her application at the front desk.  She is requesting that we leave her entire application is the front office once we fax it to [employer] so she can keep for her records.  She thanked me for our assistance.

## 2022-04-10 NOTE — Telephone Encounter (Signed)
Leaving pt 2 boxes of Eliquis 5 mg tablets samples at [facility] [address] front desk for pt to pick up.  Exp:  07/2023   Lot:  [id number]

## 2022-04-10 NOTE — Telephone Encounter (Signed)
Patient calling back for update. Please advise

## 2022-04-11 NOTE — Telephone Encounter (Signed)
Assistance forms printed off and signed by Dr. Johney Frame.  Forms faxed to the contact information provided on cover sheet.   Confirmation fax received.   Will endorse to Prior Auth Nurse of completion.

## 2022-04-11 NOTE — Telephone Encounter (Signed)
**Note De-Identified  Obfuscation** The pts completed BMSPA[employer] application for Eliquis assistance was left at the office.  I have completed the providers page of her application and have e-mailed all to Dr Jacolyn Reedy nurse so she can obtain her signature, date it, and to then fax all to [facility] at the fax number written on the cover letter included.

## 2022-04-14 NOTE — Telephone Encounter (Addendum)
**Note De-Identified  Obfuscation** Letter received from [facility]  fax stating that they have denied the pt Eliquis assistance at this time. Reason: Documentation of 3% out of pocket RX expenses, based on the households adjusted gross income, not met. Pt ID: [id number]  The letter states that they have advised the pt of this denial as well.

## 2022-04-17 ENCOUNTER — Other Ambulatory Visit: Payer: Self-pay | Admitting: *Deleted

## 2022-04-17 MED ORDER — METOPROLOL TARTRATE 25 MG PO TABS: 25.0000 mg | ORAL_TABLET | Freq: Two times a day (BID) | ORAL | 0 refills | Status: DC

## 2022-04-17 MED ORDER — FLECAINIDE ACETATE 50 MG PO TABS: 50.0000 mg | ORAL_TABLET | Freq: Two times a day (BID) | ORAL | 0 refills | Status: DC

## 2022-04-19 ENCOUNTER — Other Ambulatory Visit: Payer: Self-pay

## 2022-04-19 MED ORDER — FLECAINIDE ACETATE 50 MG PO TABS: 50.0000 mg | ORAL_TABLET | Freq: Two times a day (BID) | ORAL | 1 refills | Status: DC

## 2022-04-19 MED ORDER — METOPROLOL TARTRATE 25 MG PO TABS: 25.0000 mg | ORAL_TABLET | Freq: Two times a day (BID) | ORAL | 1 refills | Status: DC

## 2022-04-19 NOTE — Addendum Note (Signed)
Addended by: Carter Kitten D on: 04/19/2022 11:53 AM   Modules accepted: Orders

## 2022-05-09 ENCOUNTER — Telehealth: Payer: Self-pay | Admitting: Cardiology

## 2022-05-09 DIAGNOSIS — I480 Paroxysmal atrial fibrillation: Secondary | ICD-10-CM

## 2022-05-09 NOTE — Telephone Encounter (Signed)
  Patient calling the office for samples of medication:   1.  What medication and dosage are you requesting samples for?apixaban (ELIQUIS) 5 MG TABS tablet   2.  Are you currently out of this medication? Yes  Pt is out of meds and bristol myers squibb denied her application. She also requesting to get a call back from [place]

## 2022-05-09 NOTE — Telephone Encounter (Signed)
Pt last saw Dr Johney Frame 08/08/21, last labs 12/05/21 Creat 1.88, age 77, weight 85.3kg, based on specified criteria pt is on appropriate dosage of Eliquis 5mg  BID for afib.  Pt requesting samples and call back from [place], will forward to her to address pt assistance and samples.

## 2022-05-10 ENCOUNTER — Other Ambulatory Visit: Payer: Self-pay

## 2022-05-10 DIAGNOSIS — I480 Paroxysmal atrial fibrillation: Secondary | ICD-10-CM

## 2022-05-10 MED ORDER — APIXABAN 5 MG PO TABS: 5.0000 mg | ORAL_TABLET | Freq: Two times a day (BID) | ORAL | 0 refills | Status: DC

## 2022-05-10 MED ORDER — APIXABAN 5 MG PO TABS: 5.0000 mg | ORAL_TABLET | Freq: Two times a day (BID) | ORAL | 2 refills | Status: DC

## 2022-05-10 NOTE — Telephone Encounter (Signed)
Pt calling back to f/u on Samples for Eliquis. Pt states that she is out of the medication and would like a callback as soon as possible. Please advise

## 2022-05-10 NOTE — Telephone Encounter (Signed)
**Note De-Identified  Obfuscation** The pt states that [employer] advised her to call Social Services to apply for "Low income subsidy" and gave her a phone number to call. She states that she called the number and was advised that she makes too much money to qualify for their assistance.  I gave her the local phone number for [place] at [phone] ext: [phone] and advised her to call them and to specify that she wants to apply for [facility] assistance.  Just in case she is advised that she is not eligible for approval in the [facility] program: Per her request I have e-scribed her Eliquis RX to [facility] [place] to fill for a 30 day supply as she states that they advised her that the 90 day supply will cost her $147 and she cannot afford that but she does want to try to pay for her Eliquis until she meets her out of pocket RX amount of $566.17.  She states that she cannot purchase her Eliquis refill at this time but will be able to when she receives her check later this month. She is aware that we are leaving her 2 weeks of  Eliquis 5 mg samples in the front office for her to pick up and she states that this should be enough to last until she can pay for her refill.  She thanked me for our assistance.

## 2022-05-10 NOTE — Telephone Encounter (Signed)
**Note De-Identified  Obfuscation** The pt was denied Eliquis assistance because she has not met her 3% out of pocket for her mediations yet this year (2024).  I called [employer] and was advised by Jaticia that the pts 3% out of pocket RX cost is $566.17. I called the pt and she states that has not paid for her Eliquis at all this year because she cannot afford it.  She states that the same thing happened in May of 2023 when she applied for pt assistance through BMSPAFbut that she called them and they approved her. I advised her to call them now to see if she can get an approval now and to call me back ASAP as she states that she has been out of Eliquis for 2 days and is requesting samples. I advised her that once she calls me back we will have a better understanding of how to proceed.  We discussed switching to Warfarin and she states that she does not have a problem with that.

## 2022-05-11 NOTE — Telephone Encounter (Signed)
Rodman Key, RN, has taken care of this matter. Thanks  [employer]

## 2022-06-27 ENCOUNTER — Ambulatory Visit: Payer: Medicare Other | Admitting: Cardiovascular Disease

## 2022-07-31 ENCOUNTER — Other Ambulatory Visit: Payer: Self-pay | Admitting: Family Medicine

## 2022-07-31 ENCOUNTER — Ambulatory Visit
Admission: RE | Admit: 2022-07-31 | Discharge: 2022-07-31 | Disposition: A | Discharge status: Home / Self Care | Payer: Medicare Other | Source: Ambulatory Visit | Attending: Family Medicine | Admitting: Family Medicine

## 2022-07-31 DIAGNOSIS — M25551 Pain in right hip: Secondary | ICD-10-CM

## 2022-07-31 DIAGNOSIS — R0789 Other chest pain: Secondary | ICD-10-CM

## 2022-08-01 ENCOUNTER — Ambulatory Visit: Payer: Medicare Other | Admitting: Cardiovascular Disease

## 2022-08-01 ENCOUNTER — Ambulatory Visit: Payer: Medicare Other | Attending: Cardiovascular Disease | Admitting: Cardiovascular Disease

## 2022-08-01 ENCOUNTER — Encounter: Payer: Self-pay | Admitting: Cardiovascular Disease

## 2022-08-01 VITALS — BP 142/70 | HR 59 | Ht 71.0 in | Wt 192.6 lb

## 2022-08-01 DIAGNOSIS — E785 Hyperlipidemia, unspecified: Secondary | ICD-10-CM

## 2022-08-01 DIAGNOSIS — I739 Peripheral vascular disease, unspecified: Secondary | ICD-10-CM | POA: Diagnosis not present

## 2022-08-01 DIAGNOSIS — I480 Paroxysmal atrial fibrillation: Secondary | ICD-10-CM | POA: Diagnosis not present

## 2022-08-01 DIAGNOSIS — R0989 Other specified symptoms and signs involving the circulatory and respiratory systems: Secondary | ICD-10-CM | POA: Diagnosis not present

## 2022-08-01 NOTE — Progress Notes (Unsigned)
Cardiology Office Note   Date:  08/01/2022   ID:  Brittany Ford, DOB 1945/06/05, MRN [id number]  PCP:  Sigmund Hazel, MD  Cardiologist:  Dr. Kirke Corin  No chief complaint on file.     History of Present Illness: Brittany Ford is a 77 y.o. female who is here today for follow-up visit regarding peripheral arterial disease.    She has history of paroxysmal atrial fibrillation on flecainide, type 2 diabetes, hyperlipidemia with poor tolerance to statins, chronic back pain, GERD and obesity.  She is not a smoker. She was seen in 2022 for atypical left leg claudication but her symptoms were felt to be multifactorial overall with contribution from her spine disease and as well arthritis.  She underwent lower extremity arterial Doppler in June of 2022 which showed an ABI of 0.84 on the right and 0.65 on the left.  Duplex showed moderate SFA disease on the right side and significant proximal/mid SFA disease on the left.  She was initially treated medically for claudication but she returned with worsening left leg symptoms.  Angiography was performed in February 2023 which showed medium size abdominal aortic aneurysm above the iliac bifurcation, mild to moderate iliac disease bilaterally and severe diffuse SFA disease with one-vessel runoff below the knee via the peroneal artery.  I performed successful directional atherectomy and drug-coated balloon angioplasty to the whole length of the left SFA.  Postprocedure ABI improved to 0.97 on the left with patent left SFA. She reported only minimal improvement in left leg symptoms.  She returns now and reports frequent falls over the last 6 months.  She had 7 falls in total and overall does not really know what happens during these episodes.  There is possibly loss of consciousness.  No chest pain or shortness of breath.  Past Medical History:  Diagnosis Date   Atrial fibrillation (HCC)    Atrial flutter (HCC)    Chest pain    with typical  and atypical qualities   Chronic low back pain    Diabetes mellitus without complication (HCC)    Diet Controlled   Dysrhythmia    ATRIAL FIBRILATION   GERD (gastroesophageal reflux disease)    Obesity     Past Surgical History:  Procedure Laterality Date   ABDOMINAL AORTOGRAM W/LOWER EXTREMITY Left 03/23/2021   Procedure: ABDOMINAL AORTOGRAM W/LOWER EXTREMITY;  Surgeon: Iran Ouch, MD;  Location: [facility] INVASIVE CV LAB;  Service: Cardiovascular;  Laterality: Left;   BREAST EXCISIONAL BIOPSY Right 1990s   benign   BREAST EXCISIONAL BIOPSY Left 1990s   benign   COLONOSCOPY  03/01/2012   Procedure: COLONOSCOPY;  Surgeon: [name], MD;  Location: [facility] ENDOSCOPY;  Service: Endoscopy;  Laterality: N/A;   ESOPHAGOGASTRODUODENOSCOPY  02/29/2012   Procedure: ESOPHAGOGASTRODUODENOSCOPY (EGD);  Surgeon: [name], MD;  Location: [facility] ENDOSCOPY;  Service: Endoscopy;  Laterality: N/A;   FOOT SURGERY     x2   HYSTEROSCOPY WITH D & C N/A 02/09/2017   Procedure: DILATATION AND CURETTAGE /HYSTEROSCOPY;  Surgeon: Gerald Leitz, MD;  Location: [facility] ORS;  Service: Gynecology;  Laterality: N/A;  w/Myosure for Polyp   PERIPHERAL VASCULAR ATHERECTOMY Left 03/23/2021   Procedure: PERIPHERAL VASCULAR ATHERECTOMY;  Surgeon: Iran Ouch, MD;  Location: [facility] INVASIVE CV LAB;  Service: Cardiovascular;  Laterality: Left;  SFA   TONSILLECTOMY       Current Outpatient Medications  Medication Sig Dispense Refill   Accu-Chek FastClix Lancets MISC See admin instructions.  ACCU-CHEK GUIDE test strip      acetaminophen (TYLENOL) 650 MG CR tablet Take 650-1,300 mg by mouth every 8 (eight) hours as needed for pain.     apixaban (ELIQUIS) 5 MG TABS tablet Take 1 tablet (5 mg total) by mouth 2 (two) times daily. 28 tablet 0   Blood Glucose Monitoring Suppl (ACCU-CHEK AVIVA) device by Other route. Use as instructed     Cholecalciferol (VITAMIN D) 2000 units CAPS Take 4,000 Units by mouth daily.     ferrous sulfate  325 (65 FE) MG tablet Take 325 mg by mouth daily.     flecainide (TAMBOCOR) 50 MG tablet Take 1 tablet (50 mg total) by mouth 2 (two) times daily. 180 tablet 1   gabapentin (NEURONTIN) 300 MG capsule Take 600 mg by mouth 2 (two) times daily.     glipiZIDE (GLUCOTROL) 5 MG tablet TAKE 0.5 TABLETS (2.5 MG TOTAL) BY MOUTH DAILY BEFORE BREAKFAST. 30 tablet 0   glucose blood test strip Use to check blood sugars     lisinopril-hydrochlorothiazide (ZESTORETIC) 10-12.5 MG tablet Take 1 tablet by mouth daily.     magnesium oxide (MAG-OX) 400 MG tablet Take 400 mg by mouth daily.     metFORMIN (GLUCOPHAGE) 500 MG tablet Take 250 mg by mouth 2 (two) times daily.     metoprolol tartrate (LOPRESSOR) 25 MG tablet Take 1 tablet (25 mg total) by mouth 2 (two) times daily. 180 tablet 1   Multiple Vitamin (MULTIVITAMIN WITH MINERALS) TABS tablet Take 1 tablet by mouth in the morning.     traMADol (ULTRAM) 50 MG tablet Take 100 mg by mouth 2 (two) times daily.     Zinc 50 MG TABS 1 tablet     methylPREDNISolone (MEDROL DOSEPAK) 4 MG TBPK tablet Please follow directions on Dosepak (Patient not taking: Reported on 08/01/2022) 21 each 0   oxyCODONE-acetaminophen (PERCOCET/ROXICET) 5-325 MG tablet Take 1 tablet by mouth every 4 (four) hours as needed for severe pain. (Patient not taking: Reported on 08/01/2022) 15 tablet 0   No current facility-administered medications for this visit.    Allergies:   Bee venom, Influenza virus vaccine, Atorvastatin, Crestor [rosuvastatin], Hydrocodone bit-homatrop mbr, and Xarelto [rivaroxaban]    Social History:  The patient  reports that she quit smoking about 17 years ago. Her smoking use included cigarettes. She has a 10.00 pack-year smoking history. She has never used smokeless tobacco. She reports that she does not drink alcohol and does not use drugs.   Family History:  The patient's family history includes Breast cancer in her mother; COPD in her father; Heart attack in her  brother and father; Heart disease in her sister; Lung cancer in her father.    ROS:  Please see the history of present illness.   Otherwise, review of systems are positive for none.   All other systems are reviewed and negative.    PHYSICAL EXAM: VS:  BP (!) 142/70 (BP Location: Left Arm, Patient Position: Sitting, Cuff Size: Normal)   Pulse (!) 59   Ht 5\' 11"  (1.803 m)   Wt 192 lb 9.6 oz (87.4 kg)   SpO2 97%   BMI 26.86 kg/m  , BMI Body mass index is 26.86 kg/m. GEN: Well nourished, well developed, in no acute distress  HEENT: normal  Neck: no JVD, or masses.  Left carotid bruit Cardiac: RRR; no murmurs, rubs, or gallops,no edema  Respiratory:  clear to auscultation bilaterally, normal work of breathing GI: soft, nontender, nondistended, +  BS MS: no deformity or atrophy  Skin: warm and dry, no rash Neuro:  Strength and sensation are intact Psych: euthymic mood, full affect    EKG:  EKG is ordered today. EKG showed sinus bradycardia with first-degree AV block.  No significant ST changes.   Recent Labs: 12/05/2021: BUN 32; Creatinine, Ser 1.88; Hemoglobin 9.5; Platelets 329; Potassium 4.4; Sodium 137    Lipid Panel    Component Value Date/Time   CHOL 210 (H) 04/22/2013 2306   TRIG 200 (H) 04/22/2013 2306   HDL 32 (L) 04/22/2013 2306   CHOLHDL 6.6 04/22/2013 2306   VLDL 40 04/22/2013 2306   LDLCALC 138 (H) 04/22/2013 2306      Wt Readings from Last 3 Encounters:  08/01/22 192 lb 9.6 oz (87.4 kg)  12/05/21 188 lb (85.3 kg)  08/08/21 188 lb 3.2 oz (85.4 kg)            No data to display            ASSESSMENT AND PLAN:  1.  Peripheral arterial disease: Status post endovascular intervention on the left SFA.  ABI improved to normal.  No recurrent claudication.  Continue medical therapy.  2.  Paroxysmal atrial fibrillation: She is on long-term anticoagulation with Eliquis.  I am concerned about her episodes of frequent falls with possible loss of  consciousness.  I requested a 2 weeks ZIO monitor.  In addition, I am going to refer her to Dr. Lalla Brothers to discuss Watchman device to see if we can eliminate the need for anticoagulation.  3.  Hyperlipidemia: Her LDL is usually close to 150 and she reports intolerance to rosuvastatin and atorvastatin.  I discussed with her the importance of treating her hyperlipidemia given her recent diagnosis of peripheral arterial disease and her known history of diabetes mellitus.  I offered a trial of PCSK9 inhibitors but the patient is adamant about not trying any cholesterol medications.  4.  Left carotid bruit: I requested carotid Doppler.    Disposition:   Follow-up with me in 6 months.  Signed,  Lorine Bears, MD  08/01/2022 11:17 AM    [facility]

## 2022-08-01 NOTE — Patient Instructions (Signed)
Medication Instructions:  No changes *If you need a refill on your cardiac medications before your next appointment, please call your pharmacy*   Lab Work: None ordered If you have labs (blood work) drawn today and your tests are completely normal, you will receive your results only by: MyChart Message (if you have MyChart) OR A paper copy in the mail If you have any lab test that is abnormal or we need to change your treatment, we will call you to review the results.   Testing/Procedures: Your physician has requested that you have a carotid duplex. This test is an ultrasound of the carotid arteries in your neck. It looks at blood flow through these arteries that supply the brain with blood. Allow one hour for this exam. There are no restrictions or special instructions. This will take place at [address], [address].    Follow-Up: At [facility], you and your health needs are our priority.  As part of our continuing mission to provide you with exceptional heart care, we have created designated Provider Care Teams.  These Care Teams include your primary Cardiologist (physician) and Advanced Practice Providers (APPs -  Physician Assistants and Nurse Practitioners) who all work together to provide you with the care you need, when you need it.  We recommend signing up for the patient portal called "MyChart".  Sign up information is provided on this After Visit Summary.  MyChart is used to connect with patients for Virtual Visits (Telemedicine).  Patients are able to view lab/test results, encounter notes, upcoming appointments, etc.  Non-urgent messages can be sent to your provider as well.   To learn more about what you can do with MyChart, go to [email or web address].    Your next appointment:   6 month(s)  Provider:   Dr. Kirke Corin  A referral has been placed to Dr. Lalla Brothers Other Instructions ZIO XT- Long Term Monitor Instructions  Your physician has requested you  wear a ZIO patch monitor for 14 days.  This is a single patch monitor. [employer] supplies one patch monitor per enrollment. Additional stickers are not available. Please do not apply patch if you will be having a Nuclear Stress Test,  Echocardiogram, Cardiac CT, MRI, or Chest Xray during the period you would be wearing the  monitor. The patch cannot be worn during these tests. You cannot remove and re-apply the  ZIO XT patch monitor.  Your ZIO patch monitor will be mailed 3 day USPS to your address on file. It may take 3-5 days  to receive your monitor after you have been enrolled.  Once you have received your monitor, please review the enclosed instructions. Your monitor  has already been registered assigning a specific monitor serial # to you.  Billing and Patient Assistance Program Information  We have supplied [employer] with any of your insurance information on file for billing purposes. [employer] offers a sliding scale Patient Assistance Program for patients that do not have  insurance, or whose insurance does not completely cover the cost of the ZIO monitor.  You must apply for the Patient Assistance Program to qualify for this discounted rate.  To apply, please call [employer] at [phone], select option 4, select option 2, ask to apply for  Patient Assistance Program. Meredeth Ide will ask your household income, and how many people  are in your household. They will quote your out-of-pocket cost based on that information.  [employer] will also be able to set up a 45-month, interest-free payment plan if  needed.  Applying the monitor   Shave hair from upper left chest.  Hold abrader disc by orange tab. Rub abrader in 40 strokes over the upper left chest as  indicated in your monitor instructions.  Clean area with 4 enclosed alcohol pads. Let dry.  Apply patch as indicated in monitor instructions. Patch will be placed under collarbone on left  side of chest with arrow pointing upward.  Rub patch  adhesive wings for 2 minutes. Remove white label marked "1". Remove the white  label marked "2". Rub patch adhesive wings for 2 additional minutes.  While looking in a mirror, press and release button in center of patch. A small green light will  flash 3-4 times. This will be your only indicator that the monitor has been turned on.  Do not shower for the first 24 hours. You may shower after the first 24 hours.  Press the button if you feel a symptom. You will hear a small click. Record Date, Time and  Symptom in the Patient Logbook.  When you are ready to remove the patch, follow instructions on the last 2 pages of Patient  Logbook. Stick patch monitor onto the last page of Patient Logbook.  Place Patient Logbook in the blue and white box. Use locking tab on box and tape box closed  securely. The blue and white box has prepaid postage on it. Please place it in the mailbox as  soon as possible. Your physician should have your test results approximately 7 days after the  monitor has been mailed back to [facility].  Call [facility] Customer Care at [phone] if you have questions regarding  your ZIO XT patch monitor. Call them immediately if you see an orange light blinking on your  monitor.  If your monitor falls off in less than 4 days, contact our Monitor department at [phone].  If your monitor becomes loose or falls off after 4 days call [employer] at [phone] for  suggestions on securing your monitor

## 2022-08-02 ENCOUNTER — Ambulatory Visit: Payer: Medicare Other | Attending: Cardiovascular Disease

## 2022-08-02 DIAGNOSIS — I480 Paroxysmal atrial fibrillation: Secondary | ICD-10-CM

## 2022-08-02 NOTE — Progress Notes (Unsigned)
Enrolled patient for a 14 day Zio XT  monitor to be mailed to patients home  °

## 2022-08-03 ENCOUNTER — Ambulatory Visit (HOSPITAL_COMMUNITY)
Admission: RE | Admit: 2022-08-03 | Discharge: 2022-08-03 | Disposition: A | Discharge status: Home / Self Care | Payer: Medicare Other | Source: Ambulatory Visit | Attending: Cardiovascular Disease | Admitting: Cardiovascular Disease

## 2022-08-03 DIAGNOSIS — R0989 Other specified symptoms and signs involving the circulatory and respiratory systems: Secondary | ICD-10-CM

## 2022-08-05 DIAGNOSIS — I480 Paroxysmal atrial fibrillation: Secondary | ICD-10-CM

## 2022-08-09 ENCOUNTER — Telehealth: Payer: Self-pay

## 2022-08-09 NOTE — Telephone Encounter (Signed)
Per Dr. Kirke Corin, attempted to call patient to arrange Watchman consult with Dr. Lalla Brothers.  Left message to call back.

## 2022-08-11 NOTE — Telephone Encounter (Signed)
Scheduled the patient for Watchman consult with Dr. Lalla Brothers 11/22/2022. She was grateful for call and agreed with plan. Appointment info mailed per request.

## 2022-08-16 ENCOUNTER — Other Ambulatory Visit: Payer: Self-pay | Admitting: Family Medicine

## 2022-08-16 DIAGNOSIS — M064 Inflammatory polyarthropathy: Secondary | ICD-10-CM

## 2022-09-12 ENCOUNTER — Other Ambulatory Visit: Payer: Self-pay | Admitting: Cardiovascular Disease

## 2022-09-12 NOTE — Telephone Encounter (Signed)
Refill Request.  

## 2022-10-04 LAB — LAB REPORT - SCANNED: EGFR: 25

## 2022-10-05 ENCOUNTER — Other Ambulatory Visit: Payer: Self-pay

## 2022-10-05 ENCOUNTER — Encounter (HOSPITAL_COMMUNITY): Payer: Self-pay | Admitting: Emergency Medicine

## 2022-10-05 ENCOUNTER — Emergency Department (HOSPITAL_COMMUNITY): Payer: Medicare Other

## 2022-10-05 ENCOUNTER — Observation Stay (HOSPITAL_COMMUNITY)
Admission: EM | Admit: 2022-10-05 | Discharge: 2022-10-06 | Disposition: A | Discharge status: Home / Self Care | Payer: Medicare Other | Attending: Family Medicine | Admitting: Family Medicine

## 2022-10-05 DIAGNOSIS — E119 Type 2 diabetes mellitus without complications: Secondary | ICD-10-CM

## 2022-10-05 DIAGNOSIS — Z7901 Long term (current) use of anticoagulants: Secondary | ICD-10-CM | POA: Insufficient documentation

## 2022-10-05 DIAGNOSIS — Z87891 Personal history of nicotine dependence: Secondary | ICD-10-CM | POA: Diagnosis not present

## 2022-10-05 DIAGNOSIS — E1122 Type 2 diabetes mellitus with diabetic chronic kidney disease: Secondary | ICD-10-CM | POA: Diagnosis not present

## 2022-10-05 DIAGNOSIS — R42 Dizziness and giddiness: Secondary | ICD-10-CM

## 2022-10-05 DIAGNOSIS — N179 Acute kidney failure, unspecified: Secondary | ICD-10-CM

## 2022-10-05 DIAGNOSIS — R55 Syncope and collapse: Principal | ICD-10-CM | POA: Diagnosis present

## 2022-10-05 DIAGNOSIS — N1832 Chronic kidney disease, stage 3b: Secondary | ICD-10-CM | POA: Insufficient documentation

## 2022-10-05 DIAGNOSIS — I951 Orthostatic hypotension: Secondary | ICD-10-CM | POA: Insufficient documentation

## 2022-10-05 DIAGNOSIS — D649 Anemia, unspecified: Secondary | ICD-10-CM | POA: Insufficient documentation

## 2022-10-05 DIAGNOSIS — R1011 Right upper quadrant pain: Secondary | ICD-10-CM | POA: Insufficient documentation

## 2022-10-05 DIAGNOSIS — I10 Essential (primary) hypertension: Secondary | ICD-10-CM | POA: Diagnosis present

## 2022-10-05 DIAGNOSIS — R7989 Other specified abnormal findings of blood chemistry: Secondary | ICD-10-CM

## 2022-10-05 DIAGNOSIS — Z7984 Long term (current) use of oral hypoglycemic drugs: Secondary | ICD-10-CM | POA: Insufficient documentation

## 2022-10-05 DIAGNOSIS — I129 Hypertensive chronic kidney disease with stage 1 through stage 4 chronic kidney disease, or unspecified chronic kidney disease: Secondary | ICD-10-CM | POA: Insufficient documentation

## 2022-10-05 DIAGNOSIS — I480 Paroxysmal atrial fibrillation: Secondary | ICD-10-CM | POA: Diagnosis not present

## 2022-10-05 DIAGNOSIS — Z79899 Other long term (current) drug therapy: Secondary | ICD-10-CM | POA: Insufficient documentation

## 2022-10-05 DIAGNOSIS — E875 Hyperkalemia: Secondary | ICD-10-CM | POA: Diagnosis not present

## 2022-10-05 LAB — I-STAT CHEM 8, ED
BUN: 32 mg/dL — ABNORMAL HIGH (ref 8–23)
Calcium, Ion: 1.12 mmol/L — ABNORMAL LOW (ref 1.15–1.40)
Chloride: 110 mmol/L (ref 98–111)
Creatinine, Ser: 2.2 mg/dL — ABNORMAL HIGH (ref 0.44–1.00)
Glucose, Bld: 106 mg/dL — ABNORMAL HIGH (ref 70–99)
HCT: 28 % — ABNORMAL LOW (ref 36.0–46.0)
Hemoglobin: 9.5 g/dL — ABNORMAL LOW (ref 12.0–15.0)
Potassium: 5.6 mmol/L — ABNORMAL HIGH (ref 3.5–5.1)
Sodium: 138 mmol/L (ref 135–145)
TCO2: 21 mmol/L — ABNORMAL LOW (ref 22–32)

## 2022-10-05 LAB — CBC WITH DIFFERENTIAL/PLATELET
Abs Immature Granulocytes: 0.05 10*3/uL (ref 0.00–0.07)
Basophils Absolute: 0.1 10*3/uL (ref 0.0–0.1)
Basophils Relative: 1 %
Eosinophils Absolute: 0.3 10*3/uL (ref 0.0–0.5)
Eosinophils Relative: 4 %
HCT: 30.3 % — ABNORMAL LOW (ref 36.0–46.0)
Hemoglobin: 8.7 g/dL — ABNORMAL LOW (ref 12.0–15.0)
Immature Granulocytes: 1 %
Lymphocytes Relative: 22 %
Lymphs Abs: 1.8 10*3/uL (ref 0.7–4.0)
MCH: 23.4 pg — ABNORMAL LOW (ref 26.0–34.0)
MCHC: 28.7 g/dL — ABNORMAL LOW (ref 30.0–36.0)
MCV: 81.5 fL (ref 80.0–100.0)
Monocytes Absolute: 0.6 10*3/uL (ref 0.1–1.0)
Monocytes Relative: 8 %
Neutro Abs: 5.3 10*3/uL (ref 1.7–7.7)
Neutrophils Relative %: 64 %
Platelets: 343 10*3/uL (ref 150–400)
RBC: 3.72 MIL/uL — ABNORMAL LOW (ref 3.87–5.11)
RDW: 16.2 % — ABNORMAL HIGH (ref 11.5–15.5)
WBC: 8.2 10*3/uL (ref 4.0–10.5)
nRBC: 0 % (ref 0.0–0.2)

## 2022-10-05 LAB — COMPREHENSIVE METABOLIC PANEL
ALT: 7 U/L (ref 0–44)
AST: 17 U/L (ref 15–41)
Albumin: 3.4 g/dL — ABNORMAL LOW (ref 3.5–5.0)
Alkaline Phosphatase: 77 U/L (ref 38–126)
Anion gap: 8 (ref 5–15)
BUN: 32 mg/dL — ABNORMAL HIGH (ref 8–23)
CO2: 21 mmol/L — ABNORMAL LOW (ref 22–32)
Calcium: 8.6 mg/dL — ABNORMAL LOW (ref 8.9–10.3)
Chloride: 107 mmol/L (ref 98–111)
Creatinine, Ser: 2.01 mg/dL — ABNORMAL HIGH (ref 0.44–1.00)
GFR, Estimated: 25 mL/min — ABNORMAL LOW (ref >60)
Glucose, Bld: 103 mg/dL — ABNORMAL HIGH (ref 70–99)
Potassium: 5.5 mmol/L — ABNORMAL HIGH (ref 3.5–5.1)
Sodium: 136 mmol/L (ref 135–145)
Total Bilirubin: 0.3 mg/dL (ref 0.3–1.2)
Total Protein: 6.9 g/dL (ref 6.5–8.1)

## 2022-10-05 LAB — TROPONIN I (HIGH SENSITIVITY): Troponin I (High Sensitivity): 4 ng/L (ref <18)

## 2022-10-05 MED ORDER — LACTATED RINGERS IV BOLUS
1000.0000 mL | Freq: Once | INTRAVENOUS | Status: AC
  Administered 2022-10-05: 1000 mL via INTRAVENOUS

## 2022-10-05 MED ORDER — SODIUM ZIRCONIUM CYCLOSILICATE 10 G PO PACK
10.0000 g | PACK | Freq: Once | ORAL | Status: AC
  Administered 2022-10-05: 10 g via ORAL
  Filled 2022-10-05: qty 1

## 2022-10-05 NOTE — ED Provider Notes (Signed)
[facility] Emergency Department Provider Note MRN:  [id number]  Arrival date & time: 10/05/22     Chief Complaint   Abnormal Labs   History of Present Illness   Brittany Ford is a 77 y.o. year-old female presents to the ED with chief complaint of abnormal labs.  She states that she has been having increasingly frequent episodes of dizziness and syncope.  She states that she saw her doctor because she had passed out twice this week already.  She has had her BP meds adjusted and one (metoprolol) discontinued.  She takes eliquis for a-fib.  She states that she does feel SOB form time to time, but denies any chest pain.  She states that she gets extremely dizzy anytime she stands up.    History provided by patient.   Review of Systems  Pertinent positive and negative review of systems noted in HPI.    Physical Exam   Vitals:   10/05/22 2200 10/05/22 2245  BP: (!) 185/69 (!) 196/71  Pulse: (!) 54 (!) 54  Resp: 12 13  Temp:    SpO2: 100% 100%    CONSTITUTIONAL:  non toxic-appearing, NAD NEURO:  Alert and oriented x 3, CN 3-12 grossly intact EYES:  eyes equal and reactive ENT/NECK:  Supple, no stridor  CARDIO:  bradycardic, regular rhythm, appears well-perfused  PULM:  No respiratory distress, CTAB GI/GU:  non-distended,  MSK/SPINE:  No gross deformities, no edema, moves all extremities  SKIN:  no rash, atraumatic   *Additional and/or pertinent findings included in MDM below  Diagnostic and Interventional Summary    EKG Interpretation Date/Time:  Thursday October 05 2022 15:25:40 EDT Ventricular Rate:  56 PR Interval:  234 QRS Duration:  82 QT Interval:  436 QTC Calculation: 420 R Axis:   76  Text Interpretation: Sinus bradycardia with sinus arrhythmia with 1st degree A-V block Otherwise normal ECG When compared with ECG of 01-Aug-2022 10:52, PREVIOUS ECG IS PRESENT Confirmed by Virgina Norfolk [id number]) on 10/05/2022 9:27:55 PM        Labs Reviewed  COMPREHENSIVE METABOLIC PANEL - Abnormal; Notable for the following components:      Result Value   Potassium 5.5 (*)    CO2 21 (*)    Glucose, Bld 103 (*)    BUN 32 (*)    Creatinine, Ser 2.01 (*)    Calcium 8.6 (*)    Albumin 3.4 (*)    GFR, Estimated 25 (*)    All other components within normal limits  CBC WITH DIFFERENTIAL/PLATELET - Abnormal; Notable for the following components:   RBC 3.72 (*)    Hemoglobin 8.7 (*)    HCT 30.3 (*)    MCH 23.4 (*)    MCHC 28.7 (*)    RDW 16.2 (*)    All other components within normal limits  I-STAT CHEM 8, ED - Abnormal; Notable for the following components:   Potassium 5.6 (*)    BUN 32 (*)    Creatinine, Ser 2.20 (*)    Glucose, Bld 106 (*)    Calcium, Ion 1.12 (*)    TCO2 21 (*)    Hemoglobin 9.5 (*)    HCT 28.0 (*)    All other components within normal limits  TROPONIN I (HIGH SENSITIVITY)    CT HEAD WO CONTRAST ( )  Final Result      Medications  sodium zirconium cyclosilicate (LOKELMA) packet 10 g (has no administration in time range)  lactated ringers bolus 1,000 mL (  1,000 mLs Intravenous New Bag/Given 10/05/22 2238)     Procedures  /  Critical Care Procedures  ED Course and Medical Decision Making  I have reviewed the triage vital signs, the nursing notes, and pertinent available records from the EMR.  Social Determinants Affecting Complexity of Care: Patient has no clinically significant social determinants affecting this chief complaint..   ED Course:    Medical Decision Making Patient here with recurrent episodes of syncope, hyperkalemia, and elevated creatinine.  I feel that patient would benefit from admission for further workup and evaluation.  I am suspicious that her recurrent episodes of syncope could be related to being volume down, which could contribute to her elevated creatinine.  This is evidenced in her orthostatic vital signs showing a significant change between lying and  standing.  Problems Addressed: Elevated serum creatinine: undiagnosed new problem with uncertain prognosis Hyperkalemia: acute illness or injury that poses a threat to life or bodily functions Recurrent syncope: undiagnosed new problem with uncertain prognosis  Amount and/or Complexity of Data Reviewed Labs: ordered.    Details: K 5.5, Cr 2.2, will give some fluid and treat hypokalemia with lokelma Trop is 4 Radiology: ordered and independent interpretation performed.    Details: CT head shows no large intracranial bleed  Risk Prescription drug management. Decision regarding hospitalization.         Consultants: I consulted with Hospitalist, Dr. Loney Loh, who is appreciated for admitting.   Treatment and Plan: Patient's exam and diagnostic results are concerning for recurrent syncope, hyperkalemia, elevated creatinine.  Feel that patient will need admission to the hospital for further treatment and evaluation.    Final Clinical Impressions(s) / ED Diagnoses     ICD-10-CM   1. Recurrent syncope  R55     2. Hyperkalemia  E87.5     3. Elevated serum creatinine  R79.89       ED Discharge Orders     None         Discharge Instructions Discussed with and Provided to Patient:   Discharge Instructions   None      Roxy Horseman, PA-C 10/05/22 2346    Virgina Norfolk, DO 10/06/22 1708

## 2022-10-05 NOTE — ED Triage Notes (Addendum)
Pt sent from [facility] after labs yesterday that she states she was called today and told to come to the ED immediately.  Cannot remember if her potassium was really high or really low.  Pt states she has had multiple falls since January and LOC which is what she was following up for yesterday. No chest pain or shob.  No falls in the last month or so per patient.

## 2022-10-05 NOTE — H&P (Signed)
History and Physical    Lichelle Herschberger VHQ:[id number] DOB: 12-23-1945 DOA: 10/05/2022  PCP: Sigmund Hazel, MD  Patient coming from: Home  Chief Complaint: Abnormal labs  HPI: Viriginia Ford is a 77 y.o. female with medical history significant of PAD, paroxysmal A-fib/flutter on Eliquis, type 2 diabetes, hypertension, hyperlipidemia, chronic back pain, GERD, obesity sent to the ED by [facility] for evaluation of abnormal potassium level on labs and recurrent syncopal episodes since January.  In the ED, patient was slightly bradycardic with heart rate in the 50s but blood pressure elevated.  Afebrile.  Not tachypneic or hypoxic.  Orthostatic vitals checked in the ED and blood pressure dropped from 165/65 lying to 128/66 standing.  Labs notable for hemoglobin 8.7 with MCV 81.5 (baseline hemoglobin in the 9-10 range), potassium 5.5, bicarb 21, glucose 103, BUN 32, creatinine 2.0 (was 1.8 in October 2023 but previously 0.9-1.1), troponin negative.  CT head negative for acute intracranial abnormality.  EKG showing sinus bradycardia with sinus arrhythmia and first-degree AV block, baseline wander in inferior leads.  No significant change compared to previous EKG from June 2024. Patient was given Lokelma and 1 L LR.  [facility] called to admit.  Patient states she has been having fainting episodes since January.  Last episode of syncope was a month ago, however, she continues to feel dizzy every time she goes from sitting to standing position.  She takes blood pressure medications at home including metoprolol, lisinopril, and hydrochlorothiazide.  States she was seen at [facility] 2 days ago and was advised to stop taking metoprolol and cut down the dose of lisinopril and hydrochlorothiazide in half.  She also had labs drawn during this visit which resulted yesterday and she was sent to the ED for evaluation of abnormal potassium level.  Denies fevers, cough, shortness of breath, chest pain,  vomiting, abdominal pain, or diarrhea.  Review of Systems:  Review of Systems  All other systems reviewed and are negative.   Past Medical History:  Diagnosis Date   Atrial fibrillation (HCC)    Atrial flutter (HCC)    Chest pain    with typical and atypical qualities   Chronic low back pain    Diabetes mellitus without complication (HCC)    Diet Controlled   Dysrhythmia    ATRIAL FIBRILATION   GERD (gastroesophageal reflux disease)    Obesity     Past Surgical History:  Procedure Laterality Date   ABDOMINAL AORTOGRAM W/LOWER EXTREMITY Left 03/23/2021   Procedure: ABDOMINAL AORTOGRAM W/LOWER EXTREMITY;  Surgeon: Iran Ouch, MD;  [facility] IN[facility] CV [facility];  Service: Cardiovascular;  Laterality: Left;   BREAST EXCISIONAL BIOPSY Right 1990s   benign   BREAST EXCISIONAL BIOPSY Left 1990s   benign   COLONOSCOPY  03/01/2012   Procedure: COLONOSCOPY;  Surgeon: [name], MD;  Location: [facility] ENDOSCOPY;  Service: Endoscopy;  Laterality: N/A;   ESOPHAGOGASTRODUODENOSCOPY  02/29/2012   Procedure: ESOPHAGOGASTRODUODENOSCOPY (EGD);  Surgeon: [name], MD;  Location: [facility] ENDOSCOPY;  Service: Endoscopy;  Laterality: N/A;   FOOT SURGERY     x2   HYSTEROSCOPY WITH D & C N/A 02/09/2017   Procedure: DILATATION AND CURETTAGE /HYSTEROSCOPY;  Surgeon: Gerald Leitz, MD;  Location: [facility] ORS;  Service: Gynecology;  Laterality: N/A;  w/Myosure for Polyp   PERIPHERAL VASCULAR ATHERECTOMY Left 03/23/2021   Procedure: PERIPHERAL VASCULAR ATHERECTOMY;  Surgeon: Iran Ouch, MD;  [facility]: [facility];  Service: Cardiovascular;  Laterality: Left;  SFA  TONSILLECTOMY       reports that she quit smoking about 17 years ago. Her smoking use included cigarettes. She started smoking about 37 years ago. She has a 10 pack-year smoking history. She has never used smokeless tobacco. She reports that she does not drink alcohol and does not use drugs.  Allergies  Allergen Reactions   Bee Venom  Anaphylaxis   Influenza Virus Vaccine Shortness Of Breath   Atorvastatin Other (See Comments)    Muscle aches and cramping   Crestor [Rosuvastatin] Other (See Comments)    Muscle aches/cramping   Hydrocodone Bit-Homatrop Mbr Other (See Comments)    "Out of body experiences"   Xarelto [Rivaroxaban]     GI bleed    Family History  Problem Relation Age of Onset   COPD Father    Heart attack Father    Lung cancer Father    Breast cancer Mother    Heart attack Brother    Heart disease Sister     Prior to Admission medications   Medication Sig Start Date End Date Taking? Authorizing Provider  Accu-Chek FastClix Lancets MISC See admin instructions. 10/05/17   [provider]  ACCU-CHEK GUIDE test strip  11/21/19   [provider]  acetaminophen (TYLENOL) 650 MG CR tablet Take 650-1,300 mg by mouth every 8 (eight) hours as needed for pain.    [provider]  apixaban (ELIQUIS) 5 MG TABS tablet Take 1 tablet (5 mg total) by mouth 2 (two) times daily. 05/10/22   Meriam Sprague, MD  Blood Glucose Monitoring Suppl (ACCU-CHEK AVIVA) device by Other route. Use as instructed    [provider]  Cholecalciferol (VITAMIN D) 2000 units CAPS Take 4,000 Units by mouth daily.    [provider]  ferrous sulfate 325 (65 FE) MG tablet Take 325 mg by mouth daily. 10/24/21   [provider]  flecainide (TAMBOCOR) 50 MG tablet TAKE 1 TABLET BY MOUTH TWICE A DAY 09/12/22   Iran Ouch, MD  gabapentin (NEURONTIN) 300 MG capsule Take 600 mg by mouth 2 (two) times daily. 07/28/22   [provider]  glipiZIDE (GLUCOTROL) 5 MG tablet TAKE 0.5 TABLETS (2.5 MG TOTAL) BY MOUTH DAILY BEFORE BREAKFAST. 04/18/21   Shamleffer, Konrad Dolores, MD  glucose blood test strip Use to check blood sugars 07/12/12   [provider]  lisinopril-hydrochlorothiazide (ZESTORETIC) 10-12.5 MG tablet Take 1 tablet by mouth daily. 11/04/21   [provider]  magnesium oxide (MAG-OX) 400 MG tablet Take 400 mg by mouth daily.    [provider]  metFORMIN (GLUCOPHAGE) 500 MG tablet Take 250 mg by mouth 2 (two) times daily. 05/27/19   [provider]  methylPREDNISolone (MEDROL DOSEPAK) 4 MG TBPK tablet Please follow directions on Dosepak Patient not taking: Reported on 08/01/2022 12/05/21   Tegeler, Canary Brim, MD  metoprolol tartrate (LOPRESSOR) 25 MG tablet TAKE 1 TABLET BY MOUTH TWICE A DAY 09/12/22   Iran Ouch, MD  Multiple Vitamin (MULTIVITAMIN WITH MINERALS) TABS tablet Take 1 tablet by mouth in the morning.    [provider]  oxyCODONE-acetaminophen (PERCOCET/ROXICET) 5-325 MG tablet Take 1 tablet by mouth every 4 (four) hours as needed for severe pain. Patient not taking: Reported on 08/01/2022 12/05/21   Tegeler, Canary Brim, MD  traMADol (ULTRAM) 50 MG tablet Take 100 mg by mouth 2 (two) times daily. 11/04/21   [provider]  Zinc 50 MG TABS 1 tablet    [provider]    Physical Exam: Vitals:   10/05/22 2145 10/05/22 2155 10/05/22 2200 10/05/22 2245  BP:  (!) 163/73 (!) 185/69 (!) 196/71  Pulse:  (!) 57 (!) 54 (!) 54  Resp:  17 12 13   Temp: 98.2 F (36.8 C) 98.2 F (36.8 C)    TempSrc: Oral Oral    SpO2:  100% 100% 100%    Physical Exam Vitals reviewed.  Constitutional:      General: She is not in acute distress. HENT:     Head: Normocephalic and atraumatic.  Eyes:     Extraocular Movements: Extraocular movements intact.  Cardiovascular:     Rate and Rhythm: Normal rate and regular rhythm.     Pulses: Normal pulses.  Pulmonary:     Effort: Pulmonary effort is normal. No respiratory distress.     Breath sounds: Normal breath sounds. No wheezing or rales.  Abdominal:     General: Bowel sounds are normal. There is no distension.     Palpations: Abdomen is soft.     Tenderness: There is no abdominal tenderness.  Musculoskeletal:     Cervical back: Normal range  of motion.     Comments: Mild nonpitting edema at the level of ankles bilaterally  Skin:    General: Skin is warm and dry.  Neurological:     General: No focal deficit present.     Mental Status: She is alert and oriented to person, place, and time.     Labs on Admission: I have personally reviewed following labs and imaging studies  CBC: Recent Labs  Lab 10/05/22 1654 10/05/22 1701  WBC 8.2  --   NEUTROABS 5.3  --   HGB 8.7* 9.5*  HCT 30.3* 28.0*  MCV 81.5  --   PLT 343  --    Basic Metabolic Panel: Recent Labs  Lab 10/05/22 1654 10/05/22 1701  NA 136 138  K 5.5* 5.6*  CL 107 110  CO2 21*  --   GLUCOSE 103* 106*  BUN 32* 32*  CREATININE 2.01* 2.20*  CALCIUM 8.6*  --    GFR: CrCl cannot be calculated (Unknown ideal weight.). Liver Function Tests: Recent Labs  Lab 10/05/22 1654  AST 17  ALT 7  ALKPHOS 77  BILITOT 0.3  PROT 6.9  ALBUMIN 3.4*   No results for input(s): "LIPASE", "AMYLASE" in the last 168 hours. No results for input(s): "AMMONIA" in the last 168 hours. Coagulation Profile: No results for input(s): "INR", "PROTIME" in the last 168 hours. Cardiac Enzymes: No results for input(s): "CKTOTAL", "CKMB", "CKMBINDEX", "TROPONINI" in the last 168 hours. BNP (last 3 results) No results for input(s): "PROBNP" in the last 8760 hours. HbA1C: No results for input(s): "HGBA1C" in the last 72 hours. CBG: No results for input(s): "GLUCAP" in the last 168 hours. Lipid Profile: No results for input(s): "CHOL", "HDL", "LDLCALC", "TRIG", "CHOLHDL", "LDLDIRECT" in the last 72 hours. Thyroid Function Tests: No results for input(s): "TSH", "T4TOTAL", "FREET4", "T3FREE", "THYROIDAB" in the last 72 hours. Anemia Panel: No results for input(s): "VITAMINB12", "FOLATE", "FERRITIN", "TIBC", "IRON", "RETICCTPCT" in the last 72 hours. Urine analysis:    Component Value Date/Time   COLORURINE YELLOW 09/21/2020 1716   APPEARANCEUR CLEAR 09/21/2020 1716   LABSPEC  1.023 09/21/2020 1716   PHURINE 5.5 09/21/2020 1716   GLUCOSEU NEGATIVE 09/21/2020 1716   HGBUR TRACE (A) 09/21/2020 1716   BILIRUBINUR NEGATIVE 09/21/2020 1716   KETONESUR NEGATIVE 09/21/2020 1716   PROTEINUR NEGATIVE 09/21/2020 1716  UROBILINOGEN 0.2 08/13/2010 1425   NITRITE NEGATIVE 09/21/2020 1716   LEUKOCYTESUR NEGATIVE 09/21/2020 1716    Radiological Exams on Admission: CT HEAD WO CONTRAST ( )  Result Date: 10/05/2022 CLINICAL DATA:  Elevated potassium. EXAM: CT HEAD WITHOUT CONTRAST TECHNIQUE: Contiguous axial images were obtained from the base of the skull through the vertex without intravenous contrast. RADIATION DOSE REDUCTION: This exam was performed according to the departmental dose-optimization program which includes automated exposure control, adjustment of the mA and/or kV according to patient size and/or use of iterative reconstruction technique. COMPARISON:  None Available. FINDINGS: Brain: There is mild cerebral atrophy with widening of the extra-axial spaces and ventricular dilatation. There are areas of decreased attenuation within the white matter tracts of the supratentorial brain, consistent with microvascular disease changes. Several subcentimeter benign-appearing bilateral cerebellar parenchymal calcifications are seen. Vascular: No hyperdense vessel or unexpected calcification. Skull: Normal. Negative for fracture or focal lesion. Sinuses/Orbits: No acute finding. Other: None. IMPRESSION: 1. Generalized cerebral atrophy with chronic white matter small vessel ischemic changes. 2. No acute intracranial abnormality. Electronically Signed   By: Aram Candela M.D.   On: 10/05/2022 23:03    Assessment and Plan  Dizziness and syncope Patient reports episodes of dizziness and syncope since January.  Last syncopal event was a month ago.  Continues to have dizziness when going from sitting to standing position.  Slightly bradycardic with heart rate in the 50s to 60s on  metoprolol which was stopped by PCP 2 days ago.  EKG showing sinus bradycardia with sinus arrhythmia and first-degree AV block, no significant change compared to previous EKG from June 2024.  Troponin negative and patient denies chest pain.  PE less likely as she is chronically anticoagulated and not tachycardic, tachypneic, or hypoxic.  Orthostatic vitals checked in the ED and blood pressure dropped from 165/65 lying to 128/66 standing which could be contributing.  CT head negative for acute intracranial abnormality and no focal neurodeficit on exam.  Continue cardiac monitoring, IV fluid hydration, and repeat orthostatics in the morning.  Fall precautions.  Hold home metoprolol, lisinopril, and hydrochlorothiazide at this time.  Echocardiogram ordered.   She recently underwent ZIO patch monitoring from 6/29-7/8 and per report:  "Patient had a min HR of 44 bpm, max HR of 138 bpm, and avg HR of 57 bpm. Predominant underlying rhythm was Sinus Rhythm. First Degree AV Block was present. 2 Supraventricular Tachycardia runs occurred, the run with the fastest interval lasting 8 beats with a max rate of 138 bpm (avg 131 bpm); the run with the fastest interval was also the longest. Isolated SVEs were rare (<1.0%), SVE Couplets were rare (<1.0%), and SVE Triplets were rare (<1.0%). Isolated VEs were rare (<1.0%), and no VE Couplets or VE Triplets were present." Consider consulting cardiology in the morning as this problem has been ongoing since January.  AKI Likely prerenal from dehydration.  Also takes ACE inhibitor and diuretic.  BUN 32, creatinine 2.0 (was 1.8 in October 2023 but previously 0.9-1.1).  Continue IV fluid hydration and monitor renal function.  Hold lisinopril and hydrochlorothiazide.  Avoid any other nephrotoxic agents.  Mild hyperkalemia In the setting of AKI and ACE inhibitor use.  Potassium 5.5.  Patient was given [facility] in the ED.  Hold lisinopril and repeat labs in the morning.  Paroxysmal  A-fib/flutter Continue flecainide and Eliquis after pharmacy.  Last seen by her cardiologist on 08/01/2022 and was referred to Dr. Lalla Brothers to discuss Watchman device for A-fib and to eliminate  the need for chronic anticoagulation given ongoing dizziness/syncope/falls since January.  Patient states she has an appointment with Dr. Lalla Brothers in October.  Type 2 diabetes Last A1c 6.6 in April 2022, will repeat.  Sensitive sliding scale insulin ACHS.  Hypertension Holding antihypertensives at this time as mentioned above.  IV hydralazine PRN SBP >180.   Normocytic anemia Hemoglobin 8.7, baseline in the 9-10 range.  No signs or symptoms of bleeding.  Continue to monitor H&H.  PAD Hyperlipidemia Chronic back pain GERD Unable to safely order home medications at this time as pharmacy medication reconciliation is pending.  DVT prophylaxis: Continue Eliquis after pharmacy med rec is done. Code Status: DNR/DNI (discussed with the patient) Family Communication: No family available at this time. Level of care: Progressive Care Unit Admission status: It is my clinical opinion that referral for OBSERVATION is reasonable and necessary in this patient based on the above information provided. The aforementioned taken together are felt to place the patient at high risk for further clinical deterioration. However, it is anticipated that the patient may be medically stable for discharge from the hospital within 24 to 48 hours.  John Giovanni MD [facility]  If 7PM-7AM, please contact night-coverage [email or web address]  10/05/2022, 11:26 PM

## 2022-10-05 NOTE — ED Provider Triage Note (Addendum)
Emergency Medicine Provider Triage Evaluation Note  Brittany Ford , a 77 y.o. female  was evaluated in triage.  Pt complains of abnormal labs.  Patient was seen at [facility] for recurrent falls.  She was told she either has, really high potassium, or really low potassium, I do not remember".  She currently denies any chest pain or any shortness of breath.  She was seen by [facility] for syncopal episodes that she has had since January.  She has not had any falls for the past month.  Reports there were syncope with LOC again, but has not had any for the past month.  Review of Systems  Positive:  Negative:   Physical Exam  BP 129/81 (BP Location: Right Arm)   Pulse (!) 59   Temp 97.8 F (36.6 C) (Oral)   Resp 18   SpO2 98%  Gen:   Awake, no distress   Resp:  Normal effort  MSK:   Moves extremities without difficulty  Other:    Medical Decision Making  Medically screening exam initiated at 4:44 PM.  Appropriate orders placed.  Brittany Ford was informed that the remainder of the evaluation will be completed by another provider, this initial triage assessment does not replace that evaluation, and the importance of remaining in the ED until their evaluation is complete.  Labs ordered.    Brittany Rich, PA-C 10/05/22 1645    Brittany Ford, [place] 10/05/22 [phone]

## 2022-10-06 ENCOUNTER — Encounter (HOSPITAL_COMMUNITY): Payer: Self-pay | Admitting: Family Medicine

## 2022-10-06 ENCOUNTER — Other Ambulatory Visit: Payer: Self-pay | Admitting: Physician Assistant

## 2022-10-06 ENCOUNTER — Observation Stay (HOSPITAL_BASED_OUTPATIENT_CLINIC_OR_DEPARTMENT_OTHER): Payer: Medicare Other

## 2022-10-06 ENCOUNTER — Other Ambulatory Visit: Payer: Self-pay | Admitting: Cardiology

## 2022-10-06 DIAGNOSIS — I951 Orthostatic hypotension: Secondary | ICD-10-CM | POA: Diagnosis not present

## 2022-10-06 DIAGNOSIS — R55 Syncope and collapse: Secondary | ICD-10-CM

## 2022-10-06 DIAGNOSIS — N179 Acute kidney failure, unspecified: Secondary | ICD-10-CM

## 2022-10-06 DIAGNOSIS — E875 Hyperkalemia: Secondary | ICD-10-CM

## 2022-10-06 DIAGNOSIS — R42 Dizziness and giddiness: Secondary | ICD-10-CM

## 2022-10-06 LAB — BASIC METABOLIC PANEL
Anion gap: 13 (ref 5–15)
BUN: 32 mg/dL — ABNORMAL HIGH (ref 8–23)
CO2: 23 mmol/L (ref 22–32)
Calcium: 8.6 mg/dL — ABNORMAL LOW (ref 8.9–10.3)
Chloride: 101 mmol/L (ref 98–111)
Creatinine, Ser: 1.82 mg/dL — ABNORMAL HIGH (ref 0.44–1.00)
GFR, Estimated: 28 mL/min — ABNORMAL LOW (ref >60)
Glucose, Bld: 209 mg/dL — ABNORMAL HIGH (ref 70–99)
Potassium: 4.4 mmol/L (ref 3.5–5.1)
Sodium: 137 mmol/L (ref 135–145)

## 2022-10-06 LAB — GLUCOSE, CAPILLARY: Glucose-Capillary: 139 mg/dL — ABNORMAL HIGH (ref 70–99)

## 2022-10-06 LAB — ECHOCARDIOGRAM COMPLETE
Area-P 1/2: 2.16 cm2
Calc EF: 77 %
P 1/2 time: 824 msec
S' Lateral: 2.95 cm
Single Plane A2C EF: 78.8 %
Single Plane A4C EF: 75.9 %

## 2022-10-06 LAB — CBC
HCT: 28.3 % — ABNORMAL LOW (ref 36.0–46.0)
Hemoglobin: 8 g/dL — ABNORMAL LOW (ref 12.0–15.0)
MCH: 23.3 pg — ABNORMAL LOW (ref 26.0–34.0)
MCHC: 28.3 g/dL — ABNORMAL LOW (ref 30.0–36.0)
MCV: 82.5 fL (ref 80.0–100.0)
Platelets: 282 10*3/uL (ref 150–400)
RBC: 3.43 MIL/uL — ABNORMAL LOW (ref 3.87–5.11)
RDW: 16.2 % — ABNORMAL HIGH (ref 11.5–15.5)
WBC: 7.3 10*3/uL (ref 4.0–10.5)
nRBC: 0 % (ref 0.0–0.2)

## 2022-10-06 LAB — CBG MONITORING, ED
Glucose-Capillary: 200 mg/dL — ABNORMAL HIGH (ref 70–99)
Glucose-Capillary: 82 mg/dL (ref 70–99)

## 2022-10-06 LAB — TROPONIN I (HIGH SENSITIVITY): Troponin I (High Sensitivity): 5 ng/L (ref <18)

## 2022-10-06 LAB — HEMOGLOBIN A1C
Hgb A1c MFr Bld: 6.2 % — ABNORMAL HIGH (ref 4.8–5.6)
Mean Plasma Glucose: 131.24 mg/dL

## 2022-10-06 MED ORDER — ACETAMINOPHEN 650 MG RE SUPP: 650.0000 mg | Freq: Four times a day (QID) | RECTAL | Status: DC | PRN

## 2022-10-06 MED ORDER — PERFLUTREN LIPID MICROSPHERE
1.0000 mL | INTRAVENOUS | Status: AC | PRN
  Administered 2022-10-06: 2 mL via INTRAVENOUS

## 2022-10-06 MED ORDER — INSULIN ASPART 100 UNIT/ML IJ SOLN: 0.0000 [IU] | Freq: Every day | INTRAMUSCULAR | Status: DC

## 2022-10-06 MED ORDER — METOPROLOL SUCCINATE ER 25 MG PO TB24
12.5000 mg | ORAL_TABLET | Freq: Every day | ORAL | Status: DC
  Administered 2022-10-06: 12.5 mg via ORAL
  Filled 2022-10-06: qty 1

## 2022-10-06 MED ORDER — METOPROLOL SUCCINATE ER 25 MG PO TB24: 12.5000 mg | ORAL_TABLET | Freq: Every day | ORAL | 3 refills | Status: DC

## 2022-10-06 MED ORDER — ACETAMINOPHEN 325 MG PO TABS: 650.0000 mg | ORAL_TABLET | Freq: Four times a day (QID) | ORAL | Status: DC | PRN

## 2022-10-06 MED ORDER — HYDRALAZINE HCL 20 MG/ML IJ SOLN: 5.0000 mg | Freq: Four times a day (QID) | INTRAMUSCULAR | Status: DC | PRN

## 2022-10-06 MED ORDER — INSULIN ASPART 100 UNIT/ML IJ SOLN
0.0000 [IU] | Freq: Three times a day (TID) | INTRAMUSCULAR | Status: DC
  Administered 2022-10-06: 1 [IU] via SUBCUTANEOUS

## 2022-10-06 MED ORDER — SODIUM CHLORIDE 0.9 % IV SOLN
INTRAVENOUS | Status: AC
  Administered 2022-10-06: 1000 mL via INTRAVENOUS

## 2022-10-06 MED ORDER — TRAMADOL-ACETAMINOPHEN 37.5-325 MG PO TABS
1.0000 | ORAL_TABLET | Freq: Once | ORAL | Status: AC
  Administered 2022-10-06: 2 via ORAL
  Filled 2022-10-06: qty 2
  Filled 2022-10-06: qty 1

## 2022-10-06 MED ORDER — AMLODIPINE BESYLATE 2.5 MG PO TABS: 2.5000 mg | ORAL_TABLET | Freq: Every day | ORAL | 3 refills | Status: AC

## 2022-10-06 NOTE — Plan of Care (Signed)
Problem: Education: Goal: Ability to describe self-care measures that may prevent or decrease complications (Diabetes Survival Skills Education) will improve 10/06/2022 1537 by Collene Gobble, RN Outcome: Adequate for Discharge 10/06/2022 1216 by Collene Gobble, RN Outcome: Progressing Goal: Individualized Educational Video(s) 10/06/2022 1537 by Collene Gobble, RN Outcome: Adequate for Discharge 10/06/2022 1216 by Collene Gobble, RN Outcome: Progressing   Problem: Coping: Goal: Ability to adjust to condition or change in health will improve 10/06/2022 1537 by Collene Gobble, RN Outcome: Adequate for Discharge 10/06/2022 1216 by Collene Gobble, RN Outcome: Progressing   Problem: Fluid Volume: Goal: Ability to maintain a balanced intake and output will improve 10/06/2022 1537 by Collene Gobble, RN Outcome: Adequate for Discharge 10/06/2022 1216 by Collene Gobble, RN Outcome: Progressing   Problem: Health Behavior/Discharge Planning: Goal: Ability to identify and utilize available resources and services will improve 10/06/2022 1537 by Collene Gobble, RN Outcome: Adequate for Discharge 10/06/2022 1216 by Collene Gobble, RN Outcome: Progressing Goal: Ability to manage health-related needs will improve 10/06/2022 1537 by Collene Gobble, RN Outcome: Adequate for Discharge 10/06/2022 1216 by Collene Gobble, RN Outcome: Progressing   Problem: Metabolic: Goal: Ability to maintain appropriate glucose levels will improve 10/06/2022 1537 by Collene Gobble, RN Outcome: Adequate for Discharge 10/06/2022 1216 by Collene Gobble, RN Outcome: Progressing   Problem: Nutritional: Goal: Maintenance of adequate nutrition will improve 10/06/2022 1537 by Collene Gobble, RN Outcome: Adequate for Discharge 10/06/2022 1216 by Collene Gobble, RN Outcome: Progressing Goal: Progress toward achieving an optimal weight will improve 10/06/2022 1537 by Collene Gobble, RN Outcome: Adequate for  Discharge 10/06/2022 1216 by Collene Gobble, RN Outcome: Progressing   Problem: Skin Integrity: Goal: Risk for impaired skin integrity will decrease 10/06/2022 1537 by Collene Gobble, RN Outcome: Adequate for Discharge 10/06/2022 1216 by Collene Gobble, RN Outcome: Progressing   Problem: Tissue Perfusion: Goal: Adequacy of tissue perfusion will improve 10/06/2022 1537 by Collene Gobble, RN Outcome: Adequate for Discharge 10/06/2022 1216 by Collene Gobble, RN Outcome: Progressing   Problem: Education: Goal: Knowledge of General Education information will improve Description: Including pain rating scale, medication(s)/side effects and non-pharmacologic comfort measures 10/06/2022 1537 by Collene Gobble, RN Outcome: Adequate for Discharge 10/06/2022 1216 by Collene Gobble, RN Outcome: Progressing   Problem: Health Behavior/Discharge Planning: Goal: Ability to manage health-related needs will improve 10/06/2022 1537 by Collene Gobble, RN Outcome: Adequate for Discharge 10/06/2022 1216 by Collene Gobble, RN Outcome: Progressing   Problem: Clinical Measurements: Goal: Ability to maintain clinical measurements within normal limits will improve 10/06/2022 1537 by Collene Gobble, RN Outcome: Adequate for Discharge 10/06/2022 1216 by Collene Gobble, RN Outcome: Progressing Goal: Will remain free from infection 10/06/2022 1537 by Collene Gobble, RN Outcome: Adequate for Discharge 10/06/2022 1216 by Collene Gobble, RN Outcome: Progressing Goal: Diagnostic test results will improve 10/06/2022 1537 by Collene Gobble, RN Outcome: Adequate for Discharge 10/06/2022 1216 by Collene Gobble, RN Outcome: Progressing Goal: Respiratory complications will improve 10/06/2022 1537 by Collene Gobble, RN Outcome: Adequate for Discharge 10/06/2022 1216 by Collene Gobble, RN Outcome: Progressing Goal: Cardiovascular complication will be avoided 10/06/2022 1537 by Collene Gobble, RN Outcome:  Adequate for Discharge 10/06/2022 1216 by Collene Gobble, RN Outcome: Progressing   Problem: Activity: Goal: Risk for activity intolerance will decrease 10/06/2022 1537 by Collene Gobble, RN Outcome: Adequate for Discharge  10/06/2022 1216 by Collene Gobble, RN Outcome: Progressing   Problem: Nutrition: Goal: Adequate nutrition will be maintained 10/06/2022 1537 by Collene Gobble, RN Outcome: Adequate for Discharge 10/06/2022 1216 by Collene Gobble, RN Outcome: Progressing   Problem: Coping: Goal: Level of anxiety will decrease 10/06/2022 1537 by Collene Gobble, RN Outcome: Adequate for Discharge 10/06/2022 1216 by Collene Gobble, RN Outcome: Progressing   Problem: Elimination: Goal: Will not experience complications related to bowel motility 10/06/2022 1537 by Collene Gobble, RN Outcome: Adequate for Discharge 10/06/2022 1216 by Collene Gobble, RN Outcome: Progressing Goal: Will not experience complications related to urinary retention 10/06/2022 1537 by Collene Gobble, RN Outcome: Adequate for Discharge 10/06/2022 1216 by Collene Gobble, RN Outcome: Progressing   Problem: Pain Managment: Goal: General experience of comfort will improve 10/06/2022 1537 by Collene Gobble, RN Outcome: Adequate for Discharge 10/06/2022 1216 by Collene Gobble, RN Outcome: Progressing   Problem: Safety: Goal: Ability to remain free from injury will improve 10/06/2022 1537 by Collene Gobble, RN Outcome: Adequate for Discharge 10/06/2022 1216 by Collene Gobble, RN Outcome: Progressing   Problem: Skin Integrity: Goal: Risk for impaired skin integrity will decrease 10/06/2022 1537 by Collene Gobble, RN Outcome: Adequate for Discharge 10/06/2022 1216 by Collene Gobble, RN Outcome: Progressing

## 2022-10-06 NOTE — Discharge Summary (Signed)
Physician Discharge Summary   Patient: Brittany Ford MRN: [id number] DOB: 26-Apr-1945  Admit date:     10/05/2022  Discharge date: 10/06/22  Discharge Physician: Alberteen Sam   PCP: Sharmon Revere, MD     Recommendations at discharge:  Follow up with PCP Dr. Delia Chimes in 1 week for orthostatic syncope, hyperkalemia Dr. Delia Chimes:  Please check BMP in 1 week Please check BP and adjust new amlodipine as appropriate Discharge Cr 1.8 mg/dL; If SCr remains elevated at this level, please refer to nephrology Lisinopril was stopped due to hyperkalemia; hydrochlorothiazide stopped due to orthostasis --> amlodipine started  Follow up with Cardiology for hyperkalemia, orthostasis     Discharge Diagnoses: Principal Problem:   Syncope and pre-syncope due to orthostatic hypotension Active Problems:   Type 2 diabetes mellitus (HCC)   Paroxysmal A-fib (HCC)   Essential hypertension   Chronic kidney disease stage IIIb    Acute kidney injury ruled out   Hyperkalemia     Hospital Course: Brittany Ford is a 77 y.o. F with history AF, HTN who presented with near syncope and   Has been having near syncope or syncope repeatedly over months.  Seen by her Cardiologist and work up to date unremarkable.  PCP had reduced antihypertensives and obtained labs that showed hyperkalemia so she was sent to the ER.     Orthostatic syncope Admitted and orthostatic vital signs abnormal.  Given IV fluids and symptoms improved somewhat. Echo unremarkable  HCTY stopped, compression/TED hose recommended.    She will follow up with Cardiology    Hyperkalemia Due to lisinopril.  ACEi stopped.    Renal insufficiency This appears to be chronic.  Our last value in 2023 was also 1.8.  Recommend close follow up with PCP.  If her Cr is indeed 1.8 at baseline, would recommend Nehprology follow up.  Hypertension BP at rest and supine remained severely elevated in the 150s and 160s.   Cardiology recommended concurrent use of beta-blocker and flecainide, so low dose metoprolol was continued.  They added amlodipine.  Recommend close PCP follow up and titration of amlodipine as appropriate.                 The [facility] Controlled Substances Registry was reviewed for this patient prior to discharge.  Consultants: Cardiology Procedures performed: Echo  Disposition: Home Diet recommendation:  Discharge Diet Orders (From admission, onward)     Start     Ordered   10/06/22 0000  Diet - low sodium heart healthy        10/06/22 1611             DISCHARGE MEDICATION: Allergies as of 10/06/2022       Reactions   Bee Venom Anaphylaxis   Influenza Virus Vaccine Shortness Of Breath   Ace Inhibitors    Hyperkalemia   Atorvastatin Other (See Comments)   Muscle aches and cramping   Crestor [rosuvastatin] Other (See Comments)   Muscle aches/cramping   Hydrocodone Bit-homatrop Mbr Other (See Comments)   "Out of body experiences"   Xarelto [rivaroxaban]    GI bleed        Medication List     STOP taking these medications    metoprolol tartrate 25 MG tablet Commonly known as: LOPRESSOR       TAKE these medications    Accu-Chek Aviva device by Other route. Use as instructed   Accu-Chek FastClix Lancets Misc See admin instructions.   acetaminophen 650 MG CR tablet  Commonly known as: TYLENOL Take 650-1,300 mg by mouth every 8 (eight) hours as needed for pain.   amLODipine 2.5 MG tablet Commonly known as: NORVASC Take 1 tablet (2.5 mg total) by mouth daily.   apixaban 5 MG Tabs tablet Commonly known as: ELIQUIS Take 1 tablet (5 mg total) by mouth 2 (two) times daily.   ferrous sulfate 325 (65 FE) MG tablet Take 325 mg by mouth daily.   flecainide 50 MG tablet Commonly known as: TAMBOCOR TAKE 1 TABLET BY MOUTH TWICE A DAY   gabapentin 300 MG capsule Commonly known as: NEURONTIN Take 600 mg by mouth 2 (two) times daily.    glipiZIDE 5 MG tablet Commonly known as: GLUCOTROL TAKE 0.5 TABLETS (2.5 MG TOTAL) BY MOUTH DAILY BEFORE BREAKFAST.   glucose blood test strip Use to check blood sugars   Accu-Chek Guide test strip Generic drug: glucose blood   metoprolol succinate 25 MG 24 hr tablet Commonly known as: TOPROL-XL Take 0.5 tablets (12.5 mg total) by mouth daily. Start taking on: October 07, 2022   multivitamin with minerals Tabs tablet Take 1 tablet by mouth in the morning.   traMADol 50 MG tablet Commonly known as: ULTRAM Take 100 mg by mouth 2 (two) times daily.   Vitamin D 50 MCG (2000 UT) Caps Take 4,000 Units by mouth daily.        Follow-up Information     Azalee Course, [place] Follow up on 10/18/2022.   Specialties: Cardiology, Radiology Why: 2:45PM. Post hospital follow up Contact information: [address] [place] [address] [phone]         Sharmon Revere, MD. Schedule an appointment as soon as possible for a visit in 1 week(s).   Specialty: Family Medicine Why: and get labs Contact information: [address] [place] [address] [phone]                 Discharge Instructions     Diet - low sodium heart healthy   Complete by: As directed    Discharge instructions   Complete by: As directed    **IMPORTANT DISCHARGE INSTRUCTIONS**   From Dr. Maryfrances Bunnell: You were evaluated for dizziness and high potassium  For the dizziness, we think this was from chronic "orthostatic hypotension", probably exacerbated by dehdyration and some of your medicines  You were evaluated by Cardiology and monitored overnight Here, we found no evidence of arrhythmia.  Your potassium resolved with IV fluids  We recommend the following: Stop hydrochlorothiazide/HCTZ this is a diuretic and might worsen dehydration perhaps  Stop lisinopril This raises potassium  Continue metoprolol, but switch from the version you were taking to the once daily  version: Toprol XL 12.5 mg once daily starting tomorrow  START the new medicine for blood pressure amlodipine 2.5 mg daily  Go see your primary doctor  Have them check labs and check your blood pressure  Use the compression hose (or Spanks) and see how they feel  Take care with standing and look for an exit strategy (sit down) if you start to feel dizzy  Ask your primary care doctor about a kidney doctor   Increase activity slowly   Complete by: As directed        Discharge Exam: Filed Weights   10/06/22 1207  Weight: 86.5 kg    General: Pt is alert, awake, not in acute distress Cardiovascular: RRR, nl S1-S2, no murmurs appreciated.   No LE edema.   Respiratory: Normal respiratory rate and  rhythm.  CTAB without rales or wheezes. Abdominal: Abdomen soft and non-tender.  No distension or HSM.   Neuro/Psych: Strength symmetric in upper and lower extremities.  Judgment and insight appear normal.   Condition at discharge: good  The results of significant diagnostics from this hospitalization (including imaging, microbiology, ancillary and laboratory) are listed below for reference.   Imaging Studies: LONG TERM MONITOR (3-14 DAYS)  Result Date: 10/06/2022 Patch Wear Time:  9 days and 1 hours (2024-06-29T20:31:53-0400 to 2024-07-08T21:34:37-0400) Patient had a min HR of 44 bpm, max HR of 138 bpm, and avg HR of 57 bpm. Predominant underlying rhythm was Sinus Rhythm. First Degree AV Block was present. 2 Supraventricular Tachycardia runs occurred, the run with the fastest interval lasting 8 beats with a max rate of 138 bpm (avg 131 bpm); the run with the fastest interval was also the longest. Rare PACs and rare PVCs.  ECHOCARDIOGRAM COMPLETE  Result Date: 10/06/2022    ECHOCARDIOGRAM REPORT   Patient Name:   Brittany Ford Date of Exam: 10/06/2022 Medical Rec #:  [id number]            Height:       71.0 in Accession #:    [id number]           Weight:       192.6 lb Date of  Birth:  01-25-1946             BSA:          2.075 m Patient Age:    77 years             BP:           157/76 mmHg Patient Gender: F                    HR:           61 bpm. Exam Location:  Inpatient Procedure: 2D Echo, Cardiac Doppler and Color Doppler Indications:    syncope  History:        Patient has prior history of Echocardiogram examinations, most                 recent 12/31/2018. Arrythmias:Atrial Fibrillation; Risk                 Factors:Hypertension, Diabetes and Dyslipidemia.  Sonographer:    Melissa Morford RDCS (AE, PE) Referring Phys: [id number] VASUNDHRA RATHORE IMPRESSIONS  1. Left ventricular ejection fraction, by estimation, is 65 to 70%. The left ventricle has normal function. The left ventricle has no regional wall motion abnormalities. There is mild left ventricular hypertrophy. Left ventricular diastolic parameters are consistent with Grade I diastolic dysfunction (impaired relaxation). Elevated left ventricular end-diastolic pressure. The E/e' is 17.  2. Right ventricular systolic function is normal. The right ventricular size is normal. There is normal pulmonary artery systolic pressure.  3. The mitral valve is grossly normal. Trivial mitral valve regurgitation.  4. The aortic valve is tricuspid. Aortic valve regurgitation is trivial. Aortic valve sclerosis/calcification is present, without any evidence of aortic stenosis. Aortic regurgitation PHT measures 824 msec.  5. The inferior vena cava is normal in size with greater than 50% respiratory variability, suggesting right atrial pressure of 3 mmHg. Comparison(s): Changes from prior study are noted. 12/31/2018: LVEF 60-65%. FINDINGS  Left Ventricle: Left ventricular ejection fraction, by estimation, is 65 to 70%. The left ventricle has normal function. The left ventricle has no regional wall motion abnormalities. The left ventricular internal cavity  size was normal in size. There is  mild left ventricular hypertrophy. Left ventricular  diastolic parameters are consistent with Grade I diastolic dysfunction (impaired relaxation). Elevated left ventricular end-diastolic pressure. The E/e' is 46. Right Ventricle: The right ventricular size is normal. No increase in right ventricular wall thickness. Right ventricular systolic function is normal. There is normal pulmonary artery systolic pressure. The tricuspid regurgitant velocity is 2.61 m/s, and  with an assumed right atrial pressure of 3 mmHg, the estimated right ventricular systolic pressure is 30.2 mmHg. Left Atrium: Left atrial size was normal in size. Right Atrium: Right atrial size was normal in size. Pericardium: There is no evidence of pericardial effusion. Mitral Valve: The mitral valve is grossly normal. Trivial mitral valve regurgitation. Tricuspid Valve: The tricuspid valve is grossly normal. Tricuspid valve regurgitation is mild. Aortic Valve: The aortic valve is tricuspid. Aortic valve regurgitation is trivial. Aortic regurgitation PHT measures 824 msec. Aortic valve sclerosis/calcification is present, without any evidence of aortic stenosis. Pulmonic Valve: The pulmonic valve was normal in structure. Pulmonic valve regurgitation is not visualized. Aorta: The aortic root and ascending aorta are structurally normal, with no evidence of dilitation. Venous: The inferior vena cava is normal in size with greater than 50% respiratory variability, suggesting right atrial pressure of 3 mmHg. IAS/Shunts: No atrial level shunt detected by color flow Doppler.  LEFT VENTRICLE PLAX 2D LVIDd:         4.35 cm      Diastology LVIDs:         2.95 cm      LV e' medial:    5.33 cm/s LV PW:         1.05 cm      LV E/e' medial:  15.6 LV IVS:        1.20 cm      LV e' lateral:   4.57 cm/s LVOT diam:     1.90 cm      LV E/e' lateral: 18.2 LV SV:         67 LV SV Index:   32 LVOT Area:     2.84 cm  LV Volumes (MOD) LV vol d, MOD A2C: 124.0 ml LV vol d, MOD A4C: 120.0 ml LV vol s, MOD A2C: 26.3 ml LV vol s,  MOD A4C: 28.9 ml LV SV MOD A2C:     97.7 ml LV SV MOD A4C:     120.0 ml LV SV MOD BP:      97.5 ml RIGHT VENTRICLE RV S prime:     13.60 cm/s LEFT ATRIUM             Index        RIGHT ATRIUM           Index LA diam:        3.40 cm 1.64 cm/m   RA Area:     13.10 cm LA Vol (A2C):   58.9 ml 28.38 ml/m  RA Volume:   29.80 ml  14.36 ml/m LA Vol (A4C):   51.0 ml 24.58 ml/m LA Biplane Vol: 58.1 ml 28.00 ml/m  AORTIC VALVE LVOT Vmax:   115.00 cm/s LVOT Vmean:  78.900 cm/s LVOT VTI:    0.237 m AI PHT:      824 msec  AORTA Ao Root diam: 2.70 cm MITRAL VALVE                TRICUSPID VALVE MV Area (PHT): 2.16 cm     TR Peak  grad:   27.2 mmHg MV Decel Time: 351 msec     TR Vmax:        261.00 cm/s MV E velocity: 83.20 cm/s MV A velocity: 130.00 cm/s  SHUNTS MV E/A ratio:  0.64         Systemic VTI:  0.24 m                             Systemic Diam: 1.90 cm Zoila Shutter MD Electronically signed by Zoila Shutter MD Signature Date/Time: 10/06/2022/11:24:02 AM    Final    CT HEAD WO CONTRAST ( )  Result Date: 10/05/2022 CLINICAL DATA:  Elevated potassium. EXAM: CT HEAD WITHOUT CONTRAST TECHNIQUE: Contiguous axial images were obtained from the base of the skull through the vertex without intravenous contrast. RADIATION DOSE REDUCTION: This exam was performed according to the departmental dose-optimization program which includes automated exposure control, adjustment of the mA and/or kV according to patient size and/or use of iterative reconstruction technique. COMPARISON:  None Available. FINDINGS: Brain: There is mild cerebral atrophy with widening of the extra-axial spaces and ventricular dilatation. There are areas of decreased attenuation within the white matter tracts of the supratentorial brain, consistent with microvascular disease changes. Several subcentimeter benign-appearing bilateral cerebellar parenchymal calcifications are seen. Vascular: No hyperdense vessel or unexpected calcification. Skull: Normal.  Negative for fracture or focal lesion. Sinuses/Orbits: No acute finding. Other: None. IMPRESSION: 1. Generalized cerebral atrophy with chronic white matter small vessel ischemic changes. 2. No acute intracranial abnormality. Electronically Signed   By: Aram Candela M.D.   On: 10/05/2022 23:03    Microbiology: Results for orders placed or performed in visit on 02/13/20  Novel Coronavirus, NAA ([facility])     Status: None   Collection Time: 02/13/20 10:50 AM   Specimen: Nasopharyngeal(NP) swabs in vial transport medium   Nasopharynge  Screenin  Result Value Ref Range Status   SARS-CoV-2, NAA Not Detected Not Detected Final    Comment: This nucleic acid amplification test was developed and its performance characteristics determined by [employer]. Nucleic acid amplification tests include RT-PCR and TMA. This test has not been FDA cleared or approved. This test has been authorized by FDA under an Emergency Use Authorization (EUA). This test is only authorized for the duration of time the declaration that circumstances exist justifying the authorization of the emergency use of in vitro diagnostic tests for detection of SARS-CoV-2 virus and/or diagnosis of COVID-19 infection under section 564(b)(1) of the Act, 21 U.S.C. 161WRU-0(A) (1), unless the authorization is terminated or revoked sooner. When diagnostic testing is negative, the possibility of a false negative result should be considered in the context of a patient's recent exposures and the presence of clinical signs and symptoms consistent with COVID-19. An individual without symptoms of COVID-19 and who is not shedding SARS-CoV-2 virus wo uld expect to have a negative (not detected) result in this assay.     Labs: CBC: Recent Labs  Lab 10/05/22 1654 10/05/22 1701 10/06/22 0059  WBC 8.2  --  7.3  NEUTROABS 5.3  --   --   HGB 8.7* 9.5* 8.0*  HCT 30.3* 28.0* 28.3*  MCV 81.5  --  82.5  PLT 343  --  282   Basic  Metabolic Panel: Recent Labs  Lab 10/05/22 1654 10/05/22 1701 10/06/22 0059  NA 136 138 137  K 5.5* 5.6* 4.4  CL 107 110 101  CO2 21*  --  23  GLUCOSE 103* 106* 209*  BUN 32* 32* 32*  CREATININE 2.01* 2.20* 1.82*  CALCIUM 8.6*  --  8.6*   Liver Function Tests: Recent Labs  Lab 10/05/22 1654  AST 17  ALT 7  ALKPHOS 77  BILITOT 0.3  PROT 6.9  ALBUMIN 3.4*   CBG: Recent Labs  Lab 10/06/22 0057 10/06/22 0742 10/06/22 1130  GLUCAP 200* 82 139*    Discharge time spent: approximately 35 minutes spent on discharge counseling, evaluation of patient on day of discharge, and coordination of discharge planning with nursing, social work, pharmacy and case management  Signed: Alberteen Sam, MD [facility] 10/06/2022

## 2022-10-06 NOTE — ED Notes (Signed)
Taken to echo by transporter. 

## 2022-10-06 NOTE — Consult Note (Addendum)
Cardiology Consultation   Patient ID: Brittany Ford MRN: [id number]; DOB: 08/12/1945  Admit date: 10/05/2022 Date of Consult: 10/06/2022  PCP:  Brittany Hazel, MD   [facility] Providers Cardiologist:  Brittany Bears, MD        Patient Profile:   Brittany Ford is a 77 y.o. female with a hx of PAD s/p drug-coated balloon angioplasty of left SFA 03/2021, PAF on Eliquis, left subclavian artery stenosis, hypertension, hyperlipidemia, DM2 and history of GERD who is being seen 10/06/2022 for the evaluation of recurrent syncope at the request of Dr. Maryfrances Ford.  History of Present Illness:   Brittany Ford is a pleasant 77 year old female with past medical history of PAD s/p drug-coated balloon angioplasty of left SFA 03/2021, PAF on Eliquis, left subclavian artery stenosis, hypertension, hyperlipidemia, DM2 and history of GERD.  She was initially followed by Dr. Verdis Ford then later transitioned to Dr. Shari Ford.  She has been followed by Brittany Ford for her PVD issue.  She had a history of atypical left lower extremity claudications, symptom was found to be multifactorial due to her spine disease, arthritis and peripheral arterial disease.  After her left lower extremity angiography and balloon angioplasty of SFA, she only reported minimal improvement in her left leg symptom.  She was most recently seen by Brittany Ford on 08/01/2022 at which time, she mentioned frequent dizzy spell and multiple episode of brief syncope since January of this year.  She says she never had any prolonged syncope.  Symptom typically last about 1 to 2 seconds, enough for her to fall onto the ground.  Her symptom typically occurs after she changed body position.  It may occur when she changed from a sitting to a standing position or when she bent over and try to get up.  There was only 1 episode where she stood in her basement talking to her daughter when she had sudden onset of dizzy spell and presyncope.   Her symptom is usually alleviated by sitting down and resting.  She does not like to take medications and inquired about Brittany Ford procedure.  She was referred to Dr. Lalla Ford and has a consultation scheduled for October.  Because of her symptom, a carotid Doppler was obtained on 08/03/2022 that showed 1 to 59% bilateral ICA disease, stenotic left subclavian artery with antegrade flow in bilateral vertebral artery.  Heart monitor obtained in late July showed minimal heart rate 44, maximal heart rate 138, average heart rate 57 bpm, first-degree AV block, occasional transient SVT, longest episode was only 8 beats.  He had a rare PAC and PVCs.  There was no finding to explain the recent passing out spell.  Unfortunately, she also did not have any significant presyncope or syncope while on the heart monitor.  She was in the supermarket on Wednesday and was bending over to look at the cantaloupes, when she got up she became really dizzy.  She did not want to go to the emergency room, therefore with the help of the store clerk, she got back to her car.  Within 2 to 3 minutes, her symptom resolved.  She saw her PCP Brittany Ford 3 days ago who was concerned about low blood pressure.  Her metoprolol was discontinued and lisinopril-hydrochlorothiazide were cut in half.  Her last dose of lisinopril-hydrochlorothiazide was yesterday morning.  She has been having persistent right upper quadrant abdominal pain.  She had a x-ray on 07/31/2022 that was negative for rib fracture.  She continued to have  intermittent right upper quadrant abdominal pain, this is not affected by food.  She was sent to [facility], ED on 10/05/2022 due to abnormal blood work.  Her potassium was high.  On arrival, blood pressure was 196/71.  Pulse was 54.  Orthostatic vital sign was obtained in the ED, her blood pressure dropped from 165/65 lying down to 128/66 standing.  Creatinine was mildly elevated compared to baseline at 2.0.  Potassium 5.5, he was  treated with a dose of Lokelma and 1 L of lactated ringer.  Potassium normalized on repeat blood work.  All blood pressure medication has been taken off.  He was admitted to hospitalist service overnight for observation given the recent syncope.  Repeat echocardiogram showed EF 60 to 65%, no regional wall motion abnormality, grade 1 DD, trivial MR, trivial AI.  Cardiology service consulted for recurrent syncope as well.    Repeat blood pressure after she got to the floor was  Sitting 156/71, heart rate 93 Standing 119/76 heart rate 87 Standing 3-minute 120/71 heart rate 85    Past Medical History:  Diagnosis Date   Atrial fibrillation (HCC)    Atrial flutter (HCC)    Chest pain    with typical and atypical qualities   Chronic low back pain    Diabetes mellitus without complication (HCC)    Diet Controlled   Dysrhythmia    ATRIAL FIBRILATION   GERD (gastroesophageal reflux disease)    Obesity     Past Surgical History:  Procedure Laterality Date   ABDOMINAL AORTOGRAM W/LOWER EXTREMITY Left 03/23/2021   Procedure: ABDOMINAL AORTOGRAM W/LOWER EXTREMITY;  Surgeon: Brittany Ouch, MD;  Location: [facility] INVASIVE CV LAB;  Service: Cardiovascular;  Laterality: Left;   BREAST EXCISIONAL BIOPSY Right 1990s   benign   BREAST EXCISIONAL BIOPSY Left 1990s   benign   COLONOSCOPY  03/01/2012   Procedure: COLONOSCOPY;  Surgeon: [name], MD;  Location: [facility] ENDOSCOPY;  Service: Endoscopy;  Laterality: N/A;   ESOPHAGOGASTRODUODENOSCOPY  02/29/2012   Procedure: ESOPHAGOGASTRODUODENOSCOPY (EGD);  Surgeon: [name], MD;  Location[facility] END[facility]COPY;  Service: Endoscopy;  Laterality: N/A;   FOOT SURGERY     x2   HYSTEROSCOPY WITH D & C N/A 02/09/2017   Procedure: DILATATION AND CURETTAGE /HYSTEROSCOPY;  Surgeon: Gerald Leitz, MD;  Location: [facility] ORS;  Service: Gynecology;  Laterality: N/A;  w/Myosure for Polyp   PERIPHERAL VASCULAR ATHERECTOMY Left 03/23/2021   Procedure: PERIPHERAL VASCULAR ATHERECTOMY;   Surgeon: Brittany Ouch, MD;  Location: [facility] INVASIVE CV LAB;  Service: Cardiovascular;  Laterality: Left;  SFA   TONSILLECTOMY       Home Medications:  Prior to Admission medications   Medication Sig Start Date End Date Taking? Authorizing Provider  acetaminophen (TYLENOL) 650 MG CR tablet Take 650-1,300 mg by mouth every 8 (eight) hours as needed for pain.   Yes [provider]  apixaban (ELIQUIS) 5 MG TABS tablet Take 1 tablet (5 mg total) by mouth 2 (two) times daily. 05/10/22  Yes Meriam Sprague, MD  Cholecalciferol (VITAMIN D) 2000 units CAPS Take 4,000 Units by mouth daily.   Yes [provider]  ferrous sulfate 325 (65 FE) MG tablet Take 325 mg by mouth daily. 10/24/21  Yes [provider]  flecainide (TAMBOCOR) 50 MG tablet TAKE 1 TABLET BY MOUTH TWICE A DAY 09/12/22  Yes Brittany Ouch, MD  gabapentin (NEURONTIN) 300 MG capsule Take 600 mg by mouth 2 (two) times daily. 07/28/22  Yes  [provider]  glipiZIDE (GLUCOTROL) 5 MG tablet TAKE 0.5 TABLETS (2.5 MG TOTAL) BY MOUTH DAILY BEFORE BREAKFAST. 04/18/21  Yes Shamleffer, Konrad Dolores, MD  Multiple Vitamin (MULTIVITAMIN WITH MINERALS) TABS tablet Take 1 tablet by mouth in the morning.   Yes [provider]  traMADol (ULTRAM) 50 MG tablet Take 100 mg by mouth 2 (two) times daily. 11/04/21  Yes [provider]  Accu-Chek FastClix Lancets MISC See admin instructions. 10/05/17   [provider]  ACCU-CHEK GUIDE test strip  11/21/19   [provider]  Blood Glucose Monitoring Suppl (ACCU-CHEK AVIVA) device by Other route. Use as instructed    [provider]  glucose blood test strip Use to check blood sugars 07/12/12   [provider]  lisinopril-hydrochlorothiazide (ZESTORETIC) 10-12.5 MG tablet Take 1 tablet by mouth daily. 11/04/21   [provider]  metoprolol tartrate (LOPRESSOR) 25 MG tablet TAKE 1 TABLET BY MOUTH TWICE A DAY Patient  not taking: Reported on 10/06/2022 09/12/22   Brittany Ouch, MD    Inpatient Medications: Scheduled Meds:  insulin aspart  0-5 Units Subcutaneous QHS   insulin aspart  0-9 Units Subcutaneous TID WC   Continuous Infusions:  PRN Meds: acetaminophen **OR** acetaminophen, hydrALAZINE  Allergies:    Allergies  Allergen Reactions   Bee Venom Anaphylaxis   Influenza Virus Vaccine Shortness Of Breath   Atorvastatin Other (See Comments)    Muscle aches and cramping   Crestor [Rosuvastatin] Other (See Comments)    Muscle aches/cramping   Hydrocodone Bit-Homatrop Mbr Other (See Comments)    "Out of body experiences"   Xarelto [Rivaroxaban]     GI bleed    Social History:   Social History   Socioeconomic History   Marital status: Widowed    Spouse name: Not on file   Number of children: 1   Years of education: Not on file   Highest education level: Not on file  Occupational History   Not on file  Tobacco Use   Smoking status: Former    Current packs/day: 0.00    Average packs/day: 0.5 packs/day for 20.0 years (10.0 ttl pk-yrs)    Types: Cigarettes    Start date: 07/20/1985    Quit date: 07/20/2005    Years since quitting: 17.2   Smokeless tobacco: Never  Vaping Use   Vaping status: Never Used  Substance and Sexual Activity   Alcohol use: No   Drug use: No   Sexual activity: Not on file  Other Topics Concern   Not on file  Social History Narrative   Not on file   Social Determinants of Health   Financial Resource Strain: Not on file  Food Insecurity: No Food Insecurity (10/06/2022)   Hunger Vital Sign    Worried About Running Out of Food in the Last Year: Never true    Ran Out of Food in the Last Year: Never true  Transportation Needs: No Transportation Needs (10/06/2022)   PRAPARE - Administrator, Civil Service (Medical): No    Lack of Transportation (Non-Medical): No  Physical Activity: Not on file  Stress: No Stress Concern Present (11/11/2020)    Received from [facility] - Occupational Stress Questionnaire    Feeling of Stress : Only a little  Social Connections: Unknown (06/17/2021)   Received from [facility], [facility]   Social Network    Social Network: Not on file  Intimate  Partner Violence: Not At Risk (10/06/2022)   Humiliation, Afraid, Rape, and Kick questionnaire    Fear of Current or Ex-Partner: No    Emotionally Abused: No    Physically Abused: No    Sexually Abused: No    Family History:    Family History  Problem Relation Age of Onset   COPD Father    Heart attack Father    Lung cancer Father    Breast cancer Mother    Heart attack Brother    Heart disease Sister      ROS:  Please see the history of present illness.   All other ROS reviewed and negative.     Physical Exam/Data:   Vitals:   10/06/22 1116 10/06/22 1116 10/06/22 1158 10/06/22 1207  BP:   (!) 166/77   Pulse:      Resp:      Temp: 98.1 F (36.7 C)     TempSrc: Oral     SpO2:  100%    Weight:    86.5 kg  Height:    5\' 11"  (1.803 m)    Intake/Output Summary (Last 24 hours) at 10/06/2022 1343 Last data filed at 10/05/2022 2331 Gross per 24 hour  Intake 220.83 ml  Output --  Net 220.83 ml      10/06/2022   12:07 PM 08/01/2022   10:45 AM 12/05/2021    7:52 AM  Last 3 Weights  Weight (lbs) 190 lb 12.8 oz 192 lb 9.6 oz 188 lb  Weight (kg) 86.546 kg 87.363 kg 85.276 kg     Body mass index is 26.61 kg/m.  General:  Well nourished, well developed, in no acute distress HEENT: normal Neck: no JVD Vascular: No carotid bruits; Distal pulses 2+ bilaterally Cardiac:  normal S1, S2; RRR; no murmur  Lungs:  clear to auscultation bilaterally, no wheezing, rhonchi or rales  Abd: soft, nontender, no hepatomegaly  Ext: no edema Musculoskeletal:  No deformities, BUE and BLE strength normal and equal Skin: warm and dry  Neuro:  CNs 2-12 intact, no focal abnormalities  noted Psych:  Normal affect   EKG:  The EKG was personally reviewed and demonstrates: Normal sinus rhythm, no significant ST-T wave changes. Telemetry:  Telemetry was personally reviewed and demonstrates: Normal rhythm, no significant bradycardia or tachycardia, no significant pauses  Relevant CV Studies:  Echo 10/06/2022 1. Left ventricular ejection fraction, by estimation, is 65 to 70%. The  left ventricle has normal function. The left ventricle has no regional  wall motion abnormalities. There is mild left ventricular hypertrophy.  Left ventricular diastolic parameters  are consistent with Grade I diastolic dysfunction (impaired relaxation).  Elevated left ventricular end-diastolic pressure. The E/e' is 17.   2. Right ventricular systolic function is normal. The right ventricular  size is normal. There is normal pulmonary artery systolic pressure.   3. The mitral valve is grossly normal. Trivial mitral valve  regurgitation.   4. The aortic valve is tricuspid. Aortic valve regurgitation is trivial.  Aortic valve sclerosis/calcification is present, without any evidence of  aortic stenosis. Aortic regurgitation PHT measures 824 msec.   5. The inferior vena cava is normal in size with greater than 50%  respiratory variability, suggesting right atrial pressure of 3 mmHg.   Comparison(s): Changes from prior study are noted. 12/31/2018: LVEF  60-65%.    Laboratory Data:  High Sensitivity Troponin:   Recent Labs  Lab 10/05/22 2233 10/06/22 0026  TROPONINIHS 4 5     Chemistry Recent  Labs  Lab 10/05/22 1654 10/05/22 1701 10/06/22 0059  NA 136 138 137  K 5.5* 5.6* 4.4  CL 107 110 101  CO2 21*  --  23  GLUCOSE 103* 106* 209*  BUN 32* 32* 32*  CREATININE 2.01* 2.20* 1.82*  CALCIUM 8.6*  --  8.6*  GFRNONAA 25*  --  28*  ANIONGAP 8  --  13    Recent Labs  Lab 10/05/22 1654  PROT 6.9  ALBUMIN 3.4*  AST 17  ALT 7  ALKPHOS 77  BILITOT 0.3   Lipids No results for  input(s): "CHOL", "TRIG", "HDL", "LABVLDL", "LDLCALC", "CHOLHDL" in the last 168 hours.  Hematology Recent Labs  Lab 10/05/22 1654 10/05/22 1701 10/06/22 0059  WBC 8.2  --  7.3  RBC 3.72*  --  3.43*  HGB 8.7* 9.5* 8.0*  HCT 30.3* 28.0* 28.3*  MCV 81.5  --  82.5  MCH 23.4*  --  23.3*  MCHC 28.7*  --  28.3*  RDW 16.2*  --  16.2*  PLT 343  --  282   Thyroid No results for input(s): "TSH", "FREET4" in the last 168 hours.  BNPNo results for input(s): "BNP", "PROBNP" in the last 168 hours.  DDimer No results for input(s): "DDIMER" in the last 168 hours.   Radiology/Studies:  LONG TERM MONITOR (3-14 DAYS)  Result Date: 10/06/2022 Patch Wear Time:  9 days and 1 hours (2024-06-29T20:31:53-0400 to 2024-07-08T21:34:37-0400) Patient had a min HR of 44 bpm, max HR of 138 bpm, and avg HR of 57 bpm. Predominant underlying rhythm was Sinus Rhythm. First Degree AV Block was present. 2 Supraventricular Tachycardia runs occurred, the run with the fastest interval lasting 8 beats with a max rate of 138 bpm (avg 131 bpm); the run with the fastest interval was also the longest. Rare PACs and rare PVCs.  ECHOCARDIOGRAM COMPLETE  Result Date: 10/06/2022    ECHOCARDIOGRAM REPORT   Patient Name:   Brittany Ford Date of Exam: 10/06/2022 Medical Rec #:  [id number]            Height:       71.0 in Accession #:    [id number]           Weight:       192.6 lb Date of Birth:  Aug 13, 1945             BSA:          2.075 m Patient Age:    77 years             BP:           157/76 mmHg Patient Gender: F                    HR:           61 bpm. Exam Location:  Inpatient Procedure: 2D Echo, Cardiac Doppler and Color Doppler Indications:    syncope  History:        Patient has prior history of Echocardiogram examinations, most                 recent 12/31/2018. Arrythmias:Atrial Fibrillation; Risk                 Factors:Hypertension, Diabetes and Dyslipidemia.  Sonographer:    Melissa Morford RDCS (AE, PE) Referring Phys:  [id number] VASUNDHRA RATHORE IMPRESSIONS  1. Left ventricular ejection fraction, by estimation, is 65 to 70%. The left ventricle has normal function. The left ventricle  has no regional wall motion abnormalities. There is mild left ventricular hypertrophy. Left ventricular diastolic parameters are consistent with Grade I diastolic dysfunction (impaired relaxation). Elevated left ventricular end-diastolic pressure. The E/e' is 17.  2. Right ventricular systolic function is normal. The right ventricular size is normal. There is normal pulmonary artery systolic pressure.  3. The mitral valve is grossly normal. Trivial mitral valve regurgitation.  4. The aortic valve is tricuspid. Aortic valve regurgitation is trivial. Aortic valve sclerosis/calcification is present, without any evidence of aortic stenosis. Aortic regurgitation PHT measures 824 msec.  5. The inferior vena cava is normal in size with greater than 50% respiratory variability, suggesting right atrial pressure of 3 mmHg. Comparison(s): Changes from prior study are noted. 12/31/2018: LVEF 60-65%. FINDINGS  Left Ventricle: Left ventricular ejection fraction, by estimation, is 65 to 70%. The left ventricle has normal function. The left ventricle has no regional wall motion abnormalities. The left ventricular internal cavity size was normal in size. There is  mild left ventricular hypertrophy. Left ventricular diastolic parameters are consistent with Grade I diastolic dysfunction (impaired relaxation). Elevated left ventricular end-diastolic pressure. The E/e' is 31. Right Ventricle: The right ventricular size is normal. No increase in right ventricular wall thickness. Right ventricular systolic function is normal. There is normal pulmonary artery systolic pressure. The tricuspid regurgitant velocity is 2.61 m/s, and  with an assumed right atrial pressure of 3 mmHg, the estimated right ventricular systolic pressure is 30.2 mmHg. Left Atrium: Left atrial size was  normal in size. Right Atrium: Right atrial size was normal in size. Pericardium: There is no evidence of pericardial effusion. Mitral Valve: The mitral valve is grossly normal. Trivial mitral valve regurgitation. Tricuspid Valve: The tricuspid valve is grossly normal. Tricuspid valve regurgitation is mild. Aortic Valve: The aortic valve is tricuspid. Aortic valve regurgitation is trivial. Aortic regurgitation PHT measures 824 msec. Aortic valve sclerosis/calcification is present, without any evidence of aortic stenosis. Pulmonic Valve: The pulmonic valve was normal in structure. Pulmonic valve regurgitation is not visualized. Aorta: The aortic root and ascending aorta are structurally normal, with no evidence of dilitation. Venous: The inferior vena cava is normal in size with greater than 50% respiratory variability, suggesting right atrial pressure of 3 mmHg. IAS/Shunts: No atrial level shunt detected by color flow Doppler.  LEFT VENTRICLE PLAX 2D LVIDd:         4.35 cm      Diastology LVIDs:         2.95 cm      LV e' medial:    5.33 cm/s LV PW:         1.05 cm      LV E/e' medial:  15.6 LV IVS:        1.20 cm      LV e' lateral:   4.57 cm/s LVOT diam:     1.90 cm      LV E/e' lateral: 18.2 LV SV:         67 LV SV Index:   32 LVOT Area:     2.84 cm  LV Volumes (MOD) LV vol d, MOD A2C: 124.0 ml LV vol d, MOD A4C: 120.0 ml LV vol s, MOD A2C: 26.3 ml LV vol s, MOD A4C: 28.9 ml LV SV MOD A2C:     97.7 ml LV SV MOD A4C:     120.0 ml LV SV MOD BP:      97.5 ml RIGHT VENTRICLE RV S Ford:  13.60 cm/s LEFT ATRIUM             Index        RIGHT ATRIUM           Index LA diam:        3.40 cm 1.64 cm/m   RA Area:     13.10 cm LA Vol (A2C):   58.9 ml 28.38 ml/m  RA Volume:   29.80 ml  14.36 ml/m LA Vol (A4C):   51.0 ml 24.58 ml/m LA Biplane Vol: 58.1 ml 28.00 ml/m  AORTIC VALVE LVOT Vmax:   115.00 cm/s LVOT Vmean:  78.900 cm/s LVOT VTI:    0.237 m AI PHT:      824 msec  AORTA Ao Root diam: 2.70 cm MITRAL VALVE                 TRICUSPID VALVE MV Area (PHT): 2.16 cm     TR Peak grad:   27.2 mmHg MV Decel Time: 351 msec     TR Vmax:        261.00 cm/s MV E velocity: 83.20 cm/s MV A velocity: 130.00 cm/s  SHUNTS MV E/A ratio:  0.64         Systemic VTI:  0.24 m                             Systemic Diam: 1.90 cm Zoila Shutter MD Electronically signed by Zoila Shutter MD Signature Date/Time: 10/06/2022/11:24:02 AM    Final    CT HEAD WO CONTRAST ( )  Result Date: 10/05/2022 CLINICAL DATA:  Elevated potassium. EXAM: CT HEAD WITHOUT CONTRAST TECHNIQUE: Contiguous axial images were obtained from the base of the skull through the vertex without intravenous contrast. RADIATION DOSE REDUCTION: This exam was performed according to the departmental dose-optimization program which includes automated exposure control, adjustment of the mA and/or kV according to patient size and/or use of iterative reconstruction technique. COMPARISON:  None Available. FINDINGS: Brain: There is mild cerebral atrophy with widening of the extra-axial spaces and ventricular dilatation. There are areas of decreased attenuation within the white matter tracts of the supratentorial brain, consistent with microvascular disease changes. Several subcentimeter benign-appearing bilateral cerebellar parenchymal calcifications are seen. Vascular: No hyperdense vessel or unexpected calcification. Skull: Normal. Negative for fracture or focal lesion. Sinuses/Orbits: No acute finding. Other: None. IMPRESSION: 1. Generalized cerebral atrophy with chronic white matter small vessel ischemic changes. 2. No acute intracranial abnormality. Electronically Signed   By: Aram Candela M.D.   On: 10/05/2022 23:03     Assessment and Plan:   Recurrent syncope: Differential diagnosis include orthostatic hypotension and less likely arrhythmia. I suspect her orthostatic hypotension is the primary contributor behind her recurrent syncope.  Her echocardiogram was reassuring.   Recent carotid Doppler showed left subclavian artery stenosis, however no significant carotid disease or evidence of steal syndrome.  Heart monitor near the end of July was reassuring, unfortunately she did not have any recurrent syncope while wearing the heart monitor.  Her symptom typically occur right after changing the body position like when she stands up or bending over and try to get up.  Orthostatic vital sign was positive.  Recommend TED hose.  IV hydration.  Orthostatic hypotension: Likely contributing to #1.  Seen by her PCP 3 days ago who instructed her to stop the home metoprolol and cut lisinopril-HCTZ in half.  All blood pressure medications currently on hold. Avoid any diuretic.  Agree with discontinuation of HCTZ.  Obtain orthostatic vital sign on a daily basis.  Consider Florinef.  Would prefer to avoid midodrine given elevated blood pressure.   Right upper quadrant abdominal pain: Per patient has been chronic lasting several months.  No direct correlation with fatty food intake.  Does feel pain when pressing on the gallbladder region.  X-ray obtained on 07/27/2022 showed no rib fracture or acute finding.  Will defer to primary team to see if need to workup gallbladder issue.  PAD: History of left SFA balloon angioplasty in February 2023.  Postprocedure, her left lower extremity pain only showed minimal improvement.  It was suspected some of her claudication symptom was neurogenic from spinal issue and arthritis.  PAF on Eliquis: Scheduled to see Dr. Lalla Ford in October for consultation regarding Brittany Ford device.  Left subclavian artery stenosis: Denies any left upper extremity claudication symptom.  Conservative management.  Antegrade blood flow in bilateral vertebral artery, suspicion for steal syndrome fairly low.  Recommend obtaining future blood pressure in the right arm.  Hyperkalemia: Treated with Lokelma.  Acute on chronic renal insufficiency: Previous blood work in January 2023  showed creatinine 1.1.  Creatinine trended up to 1.88 in October 2023.  She arrived with creatinine of 2.01 on arrival yesterday, improved to 1.82 today.  Hyperlipidemia  DM2  Anemia: Patient has had persistent anemia since 2022.  Hemoglobin is 8.0 this morning.  She denies any active bleeding.   Risk Assessment/Risk Scores:      For questions or updates, please contact [facility] Please consult [email or web address] for contact info under    Signed, Azalee Course, [place]  10/06/2022 1:43 PM  History and all data above reviewed.  Patient examined.  I agree with the findings as above.  This lovely lady has had recurrent syncope.  She presents this time with hyperkalemia.  She has been taken off of her ACE inhibitor and was given a dose of Lokelma.  She reported orthostatic symptoms and was orthostatic as measured above.  She also has some subclavian stenosis.  It is unclear to me looking at the records how often her blood pressure staying on the right versus the left arm.  She has supine hypertension.  She has had a long history of syncope.  Extensive workup.  The only clear etiology has been orthostasis.  She otherwise does well.  She is not having any chest pressure, neck or arm discomfort.  She has not had no palpitations.  She denies any shortness of breath.  She is not anemic and has been chronically anemic.  She also has renal insufficiency is difficult to understand exactly what her baseline is.  See the readings above #8.  The patient exam reveals COR:RRR, apical systolic murmur  ,  Lungs: Clear  ,  Abd: Positive bowel sounds, no rebound no guarding, Ext diminished dorsalis pedis posttibial's bilaterally.  All available labs, radiology testing, previous records reviewed. Agree with documented assessment and plan.  Syncope: This seems to be related to orthostatic hypotension.  I had a long discussion with patient, her son and her daughter-in-law who is a physical medicine rehabilitation physician.    We discussed conservative management to include hydration, compression garments and looking for the exit strategy if she feels lightheaded.  We talked about lying down and putting her feet up when she starts to feel symptoms and avoiding sudden changes in position.  Management as needed compounded by the fact that we have to treat her supine hypertension.  Hypertension: Her blood pressure should be taking her right arm.  I am going to use a low-dose of beta-blocker since she is on flecainide and I do not want this to be unopposed.  However, I will reduce the dose.  I am going to add ACE inhibitors as an allergy and I would avoid this, ARB's and spironolactone.  Instead I will start a low-dose of amlodipine and she can keep a blood pressure diary.  CKD: We talked about oral hydration and she should follow-up of her creatinine by her primary provider.  I think she is okay for discharge.  Will schedule follow-up.   Brittany Ford  2:28 PM  10/06/2022

## 2022-10-06 NOTE — ED Notes (Signed)
Breakfast tray ordered 

## 2022-10-06 NOTE — ED Notes (Signed)
ED TO INPATIENT HANDOFF REPORT  ED Nurse Name and Phone #: [phone]  S Name/Age/Gender Brittany Ford 77 y.o. female Room/Bed: 038C/038C  Code Status   Code Status: Limited: Do not attempt resuscitation (DNR) -DNR-LIMITED -Do Not Intubate/DNI   Home/SNF/Other Home Patient oriented to: self, place, time, and situation Is this baseline? Yes   Triage Complete: Triage complete  Chief Complaint Syncope [R55]  Triage Note Pt sent from [facility] after labs yesterday that she states she was called today and told to come to the ED immediately.  Cannot remember if her potassium was really high or really low.  Pt states she has had multiple falls since January and LOC which is what she was following up for yesterday. No chest pain or shob.  No falls in the last month or so per patient.    Allergies Allergies  Allergen Reactions   Bee Venom Anaphylaxis   Influenza Virus Vaccine Shortness Of Breath   Atorvastatin Other (See Comments)    Muscle aches and cramping   Crestor [Rosuvastatin] Other (See Comments)    Muscle aches/cramping   Hydrocodone Bit-Homatrop Mbr Other (See Comments)    "Out of body experiences"   Xarelto [Rivaroxaban]     GI bleed    Level of Care/Admitting Diagnosis ED Disposition     ED Disposition  Admit   Condition  --   Comment  Hospital Area: [facility] [[id number]]  Level of Care: Telemetry Medical [104]  May place patient in observation at [facility] or [facility] if equivalent level of care is available:: No  Covid Evaluation: Asymptomatic - no recent exposure (last 10 days) testing not required  Diagnosis: Syncope [206001]  Admitting Physician: Alberteen Sam [[id number]]  Attending Physician: Alberteen Sam [[id number]]          B Medical/Surgery History Past Medical History:  Diagnosis Date   Atrial fibrillation (HCC)    Atrial flutter (HCC)    Chest pain    with typical and atypical  qualities   Chronic low back pain    Diabetes mellitus without complication (HCC)    Diet Controlled   Dysrhythmia    ATRIAL FIBRILATION   GERD (gastroesophageal reflux disease)    Obesity    Past Surgical History:  Procedure Laterality Date   ABDOMINAL AORTOGRAM W/LOWER EXTREMITY Left 03/23/2021   Procedure: ABDOMINAL AORTOGRAM W/LOWER EXTREMITY;  Surgeon: Iran Ouch, MD;  Location: [facility] INVASIVE CV LAB;  Service: Cardiovascular;  Laterality: Left;   BREAST EXCISIONAL BIOPSY Right 1990s   benign   BREAST EXCISIONAL BIOPSY Left 1990s   benign   COLONOSCOPY  03/01/2012   Procedure: COLONOSCOPY;  Surgeon: [name], MD;  Location: [facility] ENDOSCOPY;  Service: Endoscopy;  Laterality: N/A;   ESOPHAGOGASTRODUODENOSCOPY  02/29/2012   Procedure: ESOPHAGOGASTRODUODENOSCOPY (EGD);  Surgeon: [name], MD;  Location: [facility] ENDOSCOPY;  Service: Endoscopy;  Laterality: N/A;   FOOT SURGERY     x2   HYSTEROSCOPY WITH D & C N/A 02/09/2017   Procedure: DILATATION AND CURETTAGE /HYSTEROSCOPY;  Surgeon: Gerald Leitz, MD;  Location: [facility] ORS;  Service: Gynecology;  Laterality: N/A;  w/Myosure for Polyp   PERIPHERAL VASCULAR ATHERECTOMY Left 03/23/2021   Procedure: PERIPHERAL VASCULAR ATHERECTOMY;  Surgeon: Iran Ouch, MD;  Location: [facility] INVASIVE CV LAB;  Service: Cardiovascular;  Laterality: Left;  SFA   TONSILLECTOMY       A IV Location/Drains/Wounds Patient Lines/Drains/Airways Status     Active Line/Drains/Airways  Name Placement date Placement time Site Days   Peripheral IV 10/05/22 20 G 1" Anterior;Proximal;Right Forearm 10/05/22  2143  Forearm  1            Intake/Output Last 24 hours  Intake/Output Summary (Last 24 hours) at 10/06/2022 1113 Last data filed at 10/05/2022 2331 Gross per 24 hour  Intake 220.83 ml  Output --  Net 220.83 ml    Labs/Imaging Results for orders placed or performed during the hospital encounter of 10/05/22 (from the past 48 hour(s))  Comprehensive  metabolic panel     Status: Abnormal   Collection Time: 10/05/22  4:54 PM  Result Value Ref Range   Sodium 136 135 - 145 mmol/L   Potassium 5.5 (H) 3.5 - 5.1 mmol/L   Chloride 107 98 - 111 mmol/L   CO2 21 (L) 22 - 32 mmol/L   Glucose, Bld 103 (H) 70 - 99 mg/dL    Comment: Glucose reference range applies only to samples taken after fasting for at least 8 hours.   BUN 32 (H) 8 - 23 mg/dL   Creatinine, Ser 4.09 (H) 0.44 - 1.00 mg/dL   Calcium 8.6 (L) 8.9 - 10.3 mg/dL   Total Protein 6.9 6.5 - 8.1 g/dL   Albumin 3.4 (L) 3.5 - 5.0 g/dL   AST 17 15 - 41 U/L   ALT 7 0 - 44 U/L   Alkaline Phosphatase 77 38 - 126 U/L   Total Bilirubin 0.3 0.3 - 1.2 mg/dL   GFR, Estimated 25 (L) >60 mL/min    Comment: (NOTE) Calculated using the CKD-EPI Creatinine Equation (2021)    Anion gap 8 5 - 15    Comment: Performed at [facility], [address], [place], [place] [address]  CBC with Differential     Status: Abnormal   Collection Time: 10/05/22  4:54 PM  Result Value Ref Range   WBC 8.2 4.0 - 10.5 K/uL   RBC 3.72 (L) 3.87 - 5.11 MIL/uL   Hemoglobin 8.7 (L) 12.0 - 15.0 g/dL   HCT 47.8 (L) 29.5 - 62.1 %   MCV 81.5 80.0 - 100.0 fL   MCH 23.4 (L) 26.0 - 34.0 pg   MCHC 28.7 (L) 30.0 - 36.0 g/dL   RDW 30.8 (H) 65.7 - 84.6 %   Platelets 343 150 - 400 K/uL   nRBC 0.0 0.0 - 0.2 %   Neutrophils Relative % 64 %   Neutro Abs 5.3 1.7 - 7.7 K/uL   Lymphocytes Relative 22 %   Lymphs Abs 1.8 0.7 - 4.0 K/uL   Monocytes Relative 8 %   Monocytes Absolute 0.6 0.1 - 1.0 K/uL   Eosinophils Relative 4 %   Eosinophils Absolute 0.3 0.0 - 0.5 K/uL   Basophils Relative 1 %   Basophils Absolute 0.1 0.0 - 0.1 K/uL   Immature Granulocytes 1 %   Abs Immature Granulocytes 0.05 0.00 - 0.07 K/uL    Comment: Performed at [facility], [address] [place], [place] [address]  I-stat chem 8, ED (not at [facility], [facility] or [facility])     Status: Abnormal   Collection Time: 10/05/22  5:01 PM  Result Value Ref Range    Sodium 138 135 - 145 mmol/L   Potassium 5.6 (H) 3.5 - 5.1 mmol/L   Chloride 110 98 - 111 mmol/L   BUN 32 (H) 8 - 23 mg/dL   Creatinine, Ser 2.84 (H) 0.44 - 1.00 mg/dL   Glucose, Bld  106 (H) 70 - 99 mg/dL    Comment: Glucose reference range applies only to samples taken after fasting for at least 8 hours.   Calcium, Ion 1.12 (L) 1.15 - 1.40 mmol/L   TCO2 21 (L) 22 - 32 mmol/L   Hemoglobin 9.5 (L) 12.0 - 15.0 g/dL   HCT 16.1 (L) 09.6 - 04.5 %  Troponin I (High Sensitivity)     Status: None   Collection Time: 10/05/22 10:33 PM  Result Value Ref Range   Troponin I (High Sensitivity) 4 <18 ng/L    Comment: (NOTE) Elevated high sensitivity troponin I (hsTnI) values and significant  changes across serial measurements may suggest ACS but many other  chronic and acute conditions are known to elevate hsTnI results.  Refer to the "Links" section for chest pain algorithms and additional  guidance. Performed at [facility], [address] [place], [place] [address]   Troponin I (High Sensitivity)     Status: None   Collection Time: 10/06/22 12:26 AM  Result Value Ref Range   Troponin I (High Sensitivity) 5 <18 ng/L    Comment: (NOTE) Elevated high sensitivity troponin I (hsTnI) values and significant  changes across serial measurements may suggest ACS but many other  chronic and acute conditions are known to elevate hsTnI results.  Refer to the "Links" section for chest pain algorithms and additional  guidance. Performed at [facility], [address], [place], [place] [address]   CBG monitoring, ED     Status: Abnormal   Collection Time: 10/06/22 12:57 AM  Result Value Ref Range   Glucose-Capillary 200 (H) 70 - 99 mg/dL    Comment: Glucose reference range applies only to samples taken after fasting for at least 8 hours.  Basic metabolic panel     Status: Abnormal   Collection Time: 10/06/22 12:59 AM  Result Value Ref Range   Sodium 137 135 - 145 mmol/L   Potassium 4.4  3.5 - 5.1 mmol/L   Chloride 101 98 - 111 mmol/L   CO2 23 22 - 32 mmol/L   Glucose, Bld 209 (H) 70 - 99 mg/dL    Comment: Glucose reference range applies only to samples taken after fasting for at least 8 hours.   BUN 32 (H) 8 - 23 mg/dL   Creatinine, Ser 8.29 (H) 0.44 - 1.00 mg/dL   Calcium 8.6 (L) 8.9 - 10.3 mg/dL   GFR, Estimated 28 (L) >60 mL/min    Comment: (NOTE) Calculated using the CKD-EPI Creatinine Equation (2021)    Anion gap 13 5 - 15    Comment: Performed at [facility], [address] [place], [place] [address]  CBC     Status: Abnormal   Collection Time: 10/06/22 12:59 AM  Result Value Ref Range   WBC 7.3 4.0 - 10.5 K/uL   RBC 3.43 (L) 3.87 - 5.11 MIL/uL   Hemoglobin 8.0 (L) 12.0 - 15.0 g/dL   HCT 08.6 (L) 57.8 - 46.9 %   MCV 82.5 80.0 - 100.0 fL   MCH 23.3 (L) 26.0 - 34.0 pg   MCHC 28.3 (L) 30.0 - 36.0 g/dL   RDW 62.9 (H) 52.8 - 41.3 %   Platelets 282 150 - 400 K/uL   nRBC 0.0 0.0 - 0.2 %    Comment: Performed at [facility], [address], [place], [place] [address]  Hemoglobin A1c     Status: Abnormal   Collection Time: 10/06/22 12:59  AM  Result Value Ref Range   Hgb A1c MFr Bld 6.2 (H) 4.8 - 5.6 %    Comment: (NOTE) Pre diabetes:          5.7%-6.4%  Diabetes:              >6.4%  Glycemic control for   <7.0% adults with diabetes    Mean Plasma Glucose 131.24 mg/dL    Comment: Performed at [facility], [address], [place], [place] [address]  CBG monitoring, ED     Status: None   Collection Time: 10/06/22  7:42 AM  Result Value Ref Range   Glucose-Capillary 82 70 - 99 mg/dL    Comment: Glucose reference range applies only to samples taken after fasting for at least 8 hours.   CT HEAD WO CONTRAST ( )  Result Date: 10/05/2022 CLINICAL DATA:  Elevated potassium. EXAM: CT HEAD WITHOUT CONTRAST TECHNIQUE: Contiguous axial images were obtained from the base of the skull through the vertex without intravenous contrast. RADIATION  DOSE REDUCTION: This exam was performed according to the departmental dose-optimization program which includes automated exposure control, adjustment of the mA and/or kV according to patient size and/or use of iterative reconstruction technique. COMPARISON:  None Available. FINDINGS: Brain: There is mild cerebral atrophy with widening of the extra-axial spaces and ventricular dilatation. There are areas of decreased attenuation within the white matter tracts of the supratentorial brain, consistent with microvascular disease changes. Several subcentimeter benign-appearing bilateral cerebellar parenchymal calcifications are seen. Vascular: No hyperdense vessel or unexpected calcification. Skull: Normal. Negative for fracture or focal lesion. Sinuses/Orbits: No acute finding. Other: None. IMPRESSION: 1. Generalized cerebral atrophy with chronic white matter small vessel ischemic changes. 2. No acute intracranial abnormality. Electronically Signed   By: Aram Candela M.D.   On: 10/05/2022 23:03    Pending Labs Unresulted Labs (From admission, onward)    None       Vitals/Pain Today's Vitals   10/06/22 0600 10/06/22 0700 10/06/22 0800 10/06/22 0830  BP: (!) 171/68 (!) 178/71 (!) 168/85 (!) 157/76  Pulse: 60 (!) 59 60 (!) 59  Resp: 12 11 20 14   Temp: 98 F (36.7 C)     TempSrc:      SpO2: 99% 100% 100% 98%  PainSc:        Isolation Precautions No active isolations  Medications Medications  acetaminophen (TYLENOL) tablet 650 mg (has no administration in time range)    Or  acetaminophen (TYLENOL) suppository 650 mg (has no administration in time range)  0.9 %  sodium chloride infusion (1,000 mLs Intravenous New Bag/Given 10/06/22 0100)  hydrALAZINE (APRESOLINE) injection 5 mg (has no administration in time range)  insulin aspart (novoLOG) injection 0-9 Units ( Subcutaneous Not Given 10/06/22 0750)  insulin aspart (novoLOG) injection 0-5 Units ( Subcutaneous Not Given 10/06/22 0100)   perflutren lipid microspheres (DEFINITY) IV suspension (2 mLs Intravenous Given 10/06/22 0956)  lactated ringers bolus 1,000 mL (0 mLs Intravenous Stopped 10/05/22 2331)  sodium zirconium cyclosilicate (LOKELMA) packet 10 g (10 g Oral Given 10/05/22 2341)    Mobility walks with device     Focused Assessments Abnormal labs   R Recommendations: See Admitting Provider Note  Report given to:   Additional Notes:

## 2022-10-06 NOTE — Plan of Care (Signed)

## 2022-10-18 ENCOUNTER — Ambulatory Visit: Payer: Medicare Other | Admitting: Physician Assistant

## 2022-10-24 ENCOUNTER — Other Ambulatory Visit: Payer: Self-pay | Admitting: Family Medicine

## 2022-10-24 DIAGNOSIS — R1011 Right upper quadrant pain: Secondary | ICD-10-CM

## 2022-10-25 ENCOUNTER — Other Ambulatory Visit: Payer: Medicare Other

## 2022-10-27 ENCOUNTER — Ambulatory Visit
Admission: RE | Admit: 2022-10-27 | Discharge: 2022-10-27 | Disposition: A | Discharge status: Home / Self Care | Payer: Medicare Other | Source: Ambulatory Visit | Attending: Family Medicine

## 2022-10-27 DIAGNOSIS — R1011 Right upper quadrant pain: Secondary | ICD-10-CM

## 2022-11-17 ENCOUNTER — Telehealth: Payer: Self-pay

## 2022-11-17 NOTE — Telephone Encounter (Signed)
-----   Message from [place] sent at 11/17/2022  3:42 PM EDT ----- Regarding: Watchman Consult Pt calling to reschedule her Consult appt with Dr. Lalla Brothers. Please advise

## 2022-11-17 NOTE — Telephone Encounter (Signed)
Per patient request, rescheduled her Watchman consult to 02/06/2023 with Dr. Lalla Brothers.  Will send letter with new appointment info.

## 2022-11-21 ENCOUNTER — Ambulatory Visit: Payer: Medicare Other | Admitting: Cardiology

## 2022-11-22 ENCOUNTER — Ambulatory Visit: Payer: Self-pay | Admitting: Surgery

## 2022-11-22 ENCOUNTER — Ambulatory Visit: Payer: Medicare Other | Admitting: Cardiology

## 2022-11-22 NOTE — H&P (Signed)
Subjective   Chief Complaint: New Consultation (Cholecystectomy)   Cardiology - [place]   History of Present Illness: Brittany Ford is a 77 y.o. female who is seen today as an office consultation at the request of Dr. Delia Chimes for evaluation of New Consultation (Cholecystectomy) .     This is a 77 year old female on Eliquis for atrial fibrillation who presents with a one year history of right upper quadrant/ right flank pain.  She has had some nausea and diarrhea, but mainly she has constant pain in this area.  She denies any correlation to eating.  Recently, she had LFT's that were within normal limits, but Cr elevated at 2.01.  US showed cholelithiasis without evidence of acute cholecystitis.  She is referred to [place] to discuss cholecystectomy.  She was hospitalized in late August 2024 for weakness and syncope.  She has not seen Cardiology since discharge.     Review of Systems: A complete review of systems was obtained from the patient.  I have reviewed this information and discussed as appropriate with the patient.  See HPI as well for other ROS.   Review of Systems  Constitutional: Negative.   HENT: Negative.    Eyes: Negative.   Respiratory: Negative.    Cardiovascular: Negative.   Gastrointestinal:  Positive for abdominal pain, diarrhea and nausea.  Genitourinary: Negative.   Musculoskeletal: Negative.   Skin: Negative.   Neurological:  Positive for weakness.  Endo/Heme/Allergies: Negative.   Psychiatric/Behavioral: Negative.          Medical History: Past Medical History Past Medical History: Diagnosis Date  Arthritis    Diabetes mellitus without complication (CMS/HHS-HCC)    Hyperlipidemia        Problem List Patient Active Problem List Diagnosis  Hyperlipidemia, unspecified  AKI (acute kidney injury) (CMS-HCC)  Anemia associated with acute blood loss  Anemia, unspecified  Aortic aneurysm (CMS-HCC)  Arthropathy  Asthma (HHS-HCC)  Bilateral lower  extremity edema  Chronic anticoagulation  Degeneration of cervical intervertebral disc  Depression  Dizziness  Dyslipidemia  Esophageal reflux  Essential hypertension  Hyperglycemia due to type 2 diabetes mellitus (CMS/HHS-HCC)  Hyperkalemia  Hypothyroidism  Insomnia, unspecified  Iron deficiency anemia  Kidney stone  Left lower quadrant pain  Arthropathy of spinal facet joint  Major depressive disorder, recurrent episode (CMS-HCC)  Mitral regurgitation  Obstructive sleep apnea syndrome  Other intervertebral disc degeneration, lumbosacral region without mention of lumbar back pain or lower extremity pain  Paroxysmal A-fib (CMS/HHS-HCC)  Pure hypercholesterolemia  Statin myopathy  Syncope  Type 2 diabetes mellitus (CMS/HHS-HCC)  Unsatisfactory cytologic smear of vagina  Vitamin D deficiency      Past Surgical History History reviewed. No pertinent surgical history.     Allergies Allergies Allergen Reactions  Haemophilus Influenzae Type B Shortness Of Breath  Hydrocodone-Homatropine Itching  Venom-Honey Bee Anaphylaxis  Atorvastatin Other (See Comments)     Muscle aches and cramping  Rivaroxaban Other (See Comments)     GI bleed      Medications Ordered Prior to Encounter Current Outpatient Medications on File Prior to Visit Medication Sig Dispense Refill  amLODIPine (NORVASC) 2.5 MG tablet Take 2.5 mg by mouth once daily      apixaban (ELIQUIS) 5 mg tablet Take 5 mg by mouth 2 (two) times daily      ferrous sulfate 325 (65 FE) MG tablet Take 325 mg by mouth once daily      flecainide (TAMBOCOR) 50 MG tablet Take 1 tablet by mouth 2 (  two) times daily      gabapentin (NEURONTIN) 300 MG capsule Take 300 mg by mouth 4 (four) times daily      metoprolol succinate (TOPROL-XL) 25 MG XL tablet Take 12.5 mg by mouth once daily      traMADoL (ULTRAM) 50 mg tablet TAKE 2 TABLETS (100 MG) BY MOUTH TWICE A DAY FOR HIP AND BACK PAIN .        No current  facility-administered medications on file prior to visit.      Family History Family History Problem Relation Age of Onset  Breast cancer Mother    Stroke Father        Tobacco Use History Social History    Tobacco Use Smoking Status Former  Types: Cigarettes Smokeless Tobacco Not on file      Social History Social History    Socioeconomic History  Marital status: Widowed Tobacco Use  Smoking status: Former     Types: Cigarettes Vaping Use  Vaping status: Unknown Substance and Sexual Activity  Alcohol use: Not Currently  Drug use: Never    Social Drivers of Health    Food Insecurity: No Food Insecurity (10/06/2022)   Received [facility]   Hunger Vital Sign    Worried About Running Out of Food in the Last Year: Never true    Ran Out of Food in the Last Year: Never true Transportation Needs: No Transportation Needs (10/06/2022)   Received from [facility] - Transportation    Lack of Transportation (Medical): No    Lack of Transportation (Non-Medical): No Stress: No Stress Concern Present (11/11/2020)   Received from [facility] - Occupational Stress Questionnaire    Feeling of Stress : Only a little   Received from [employer]   Social Network      Objective:     Vitals:   11/22/22 1410 BP: (!) 154/80 Pulse: 79 Temp: 36.4 C (97.5 F) Weight: 85.8 kg (189 lb 3.2 oz) Height: 180.3 cm (5\' 11" ) PainSc: 10-Worst pain ever   Body mass index is 26.39 kg/m.   Physical Exam    Constitutional:  WDWN in NAD, conversant, no obvious deformities; lying in bed comfortably Eyes:  Pupils equal, round; sclera anicteric; moist conjunctiva; no lid lag HENT:  Oral mucosa moist; good dentition  Neck:  No masses palpated, trachea midline; no thyromegaly Lungs:  CTA bilaterally; normal respiratory effort CV:  Regular rate and rhythm; no murmurs; extremities well-perfused with no edema Abd:  +bowel  sounds, soft, mild RUQ tenderness radiating around to her right flank, no palpable organomegaly; no palpable hernias Musc:  slow gait; no apparent clubbing or cyanosis in extremities Lymphatic:  No palpable cervical or axillary lymphadenopathy Skin:  Warm, dry; no sign of jaundice Psychiatric - alert and oriented x 4; calm mood and affect     Labs, Imaging and Diagnostic Testing: CLINICAL DATA:  Right upper quadrant pain   EXAM: ULTRASOUND ABDOMEN LIMITED RIGHT UPPER QUADRANT   COMPARISON:  None Available.   FINDINGS: Gallbladder:   Cholelithiasis. No gallbladder wall thickening or pericholecystic fluid. Sonographer reports positive sonographic Murphy's sign.   Common bile duct:   Diameter: 3.4 mm   Liver:   No focal lesion identified. Within normal limits in parenchymal echogenicity. Portal vein is patent on color Doppler imaging with normal direction of blood flow towards the liver.   Other: Note is made of a 4.6 cm right renal cyst. No imaging  follow-up needed.   IMPRESSION: Cholelithiasis without gallbladder wall thickening or pericholecystic fluid. Sonographer reports positive sonographic Murphy's sign. Recommend clinical correlation. Consider HIDA scan as clinically warranted.   These results will be called to the ordering clinician or representative by the Radiologist Assistant, and communication documented in the PACS or [employer].     Electronically Signed   By: Annia Belt M.D.   On: 10/27/2022 09:46   Assessment and Plan: Diagnoses and all orders for this visit:   Calculus of gallbladder with chronic cholecystitis without obstruction       We will try to obtain cardiac clearance from Dr. Kirke Corin.  She will need to stop her Eliquis prior to surgery.  Recommend laparoscopic cholecystectomy with intraoperative cholangiogram.  The surgical procedure has been discussed with the patient.  Potential risks, benefits, alternative treatments, and expected  outcomes have been explained.  All of the patient's questions at this time have been answered.  The likelihood of reaching the patient's treatment goal is good.  The patient understands the proposed surgical procedure and wishes to proceed.   Due to her medical comorbidities, we will probably keep her overnight after performing her surgery at [facility].     Lissa Morales, MD  11/22/2022 9:01 PM

## 2022-11-23 ENCOUNTER — Telehealth: Payer: Self-pay

## 2022-11-23 NOTE — Telephone Encounter (Signed)
Pt has been scheduled to see [name], [facility] for pre op clearance.Brittany Ford

## 2022-11-23 NOTE — Telephone Encounter (Signed)
..  Pre-operative Risk Assessment    Patient Name: Brittany Ford  DOB: 06-Apr-1945 MRN: [id number]      Request for Surgical Clearance    Procedure:   LAPAROSCOPIC CHOLECYSTECTOMY  Date of Surgery:  Clearance TBD                                 Surgeon:  DR Manus Rudd Surgeon's Group or Practice Name:  [facility] Phone number:  [phone] Fax number:  [phone]   Type of Clearance Requested:   - Medical  - Pharmacy:  Hold Apixaban (Eliquis)     Type of Anesthesia:  General    Additional requests/questions:   LAST O/V 08/01/22 DR ARIDA, NEXT APPT 02/06/23 DR Lalla Brothers  Jola Babinski   11/23/2022, 10:45 AM

## 2022-11-23 NOTE — Telephone Encounter (Signed)
Name: Brittany Ford  DOB: 11/17/1945  MRN: [id number]  Primary Cardiologist: Lorine Bears, MD  Chart reviewed as part of pre-operative protocol coverage. Because of [name]'s past medical history and time since last visit, she will require a follow-up in-office visit in order to better assess preoperative cardiovascular risk.  Pre-op covering staff: - Please schedule appointment and call patient to inform them. If patient already had an upcoming appointment within acceptable timeframe, please add "pre-op clearance" to the appointment notes so provider is aware. - Please contact requesting surgeon's office via preferred method (i.e, phone, fax) to inform them of need for appointment prior to surgery.  Per office protocol, patient can hold Eliquis for 2-3 days prior to procedure.    Sharlene Dory, PA-C  11/23/2022, 12:52 PM

## 2022-11-23 NOTE — Telephone Encounter (Signed)
Pt is scheduled already with Dr. Kirke Corin for a 6 month f/u. I will add need pre op clearance as well to appt notes.   I will update all parties involved.

## 2022-11-23 NOTE — Telephone Encounter (Signed)
Patient with diagnosis of afib on Eliquis for anticoagulation.    Procedure: LAPAROSCOPIC CHOLECYSTECTOMY  Date of procedure: TBD  CHA2DS2-VASc Score = 6  This indicates a 9.7% annual risk of stroke. The patient's score is based upon: CHF History: 0 HTN History: 1 Diabetes History: 1 Stroke History: 0 Vascular Disease History: 1 Age Score: 2 Gender Score: 1   CrCl 22mL/min Platelet count 282K  Per office protocol, patient can hold Eliquis for 2-3 days prior to procedure.    **This guidance is not considered finalized until pre-operative APP has relayed final recommendations.**

## 2022-11-23 NOTE — Telephone Encounter (Signed)
Her appointment with me is not for another 2 months.  I do not think the surgery can wait that long.  Please schedule with an APP before that.  Thanks.

## 2022-11-24 ENCOUNTER — Ambulatory Visit: Payer: Medicare Other | Admitting: Medical

## 2022-11-24 NOTE — Progress Notes (Deleted)
Cardiology Office Note:    Date:  11/24/2022   ID:  Brittany Ford, DOB [birth year], MRN [id number]  PCP:  Sharmon Revere, MD  [facility] Cardiologist:  Lorine Bears, MD  [facility] Electrophysiologist:  Lanier Prude, MD   Referring MD: Sharmon Revere, MD   Chief Complaint: Hospital follow-up  History of Present Illness:    Brittany Ford is a 77 y.o. female with a hx of PAD s/p drug-coated balloon angioplasty of left SFE 03/2021, paroxysmal Afib on flecainide, left subclavian artery stenosis, DM2, HLD, poor intolerance to statins, chronic back pain, GERD and obesity who is being seen for hospital follow-up.  Past Medical History:  Diagnosis Date   Atrial fibrillation (HCC)    Atrial flutter (HCC)    Chest pain    with typical and atypical qualities   Chronic low back pain    Diabetes mellitus without complication (HCC)    Diet Controlled   Dysrhythmia    ATRIAL FIBRILATION   GERD (gastroesophageal reflux disease)    Obesity     Past Surgical History:  Procedure Laterality Date   ABDOMINAL AORTOGRAM W/LOWER EXTREMITY Left 03/23/2021   Procedure: ABDOMINAL AORTOGRAM W/LOWER EXTREMITY;  Surgeon: Iran Ouch, MD;  Location: [facility];  Service: Cardiovascular;  Laterality: Left;   BREAST EXCISIONAL BIOPSY Right 1990s   benign   BREAST EXCISIONAL BIOPSY Left 1990s   benign   COLONOSCOPY  03/01/2012   Procedure: COLONOSCOPY;  Surgeon: [name], MD;  Location: [facility] ENDOSCOPY;  Service: Endoscopy;  Laterality: N/A;   ESOPHAGOGASTRODUODENOSCOPY  02/29/2012   Procedure: ESOPHAGOGASTRODUODENOSCOPY (EGD);  Surgeon: [name], MD;  Location: [facility] ENDOSCOPY;  Service: Endoscopy;  Laterality: N/A;   FOOT SURGERY     x2   HYSTEROSCOPY WITH D & C N/A 02/09/2017   Procedure: DILATATION AND CURETTAGE /HYSTEROSCOPY;  Surgeon: Gerald Leitz, MD;  Location: [facility] ORS;  Service: Gynecology;  Laterality: N/A;  w/Myosure for Polyp   PERIPHERAL VASCULAR  ATHERECTOMY Left 03/23/2021   Procedure: PERIPHERAL VASCULAR ATHERECTOMY;  Surgeon: Iran Ouch, MD;  Location: [facility] CV [facility];  Service: Cardiovascular;  Laterality: Left;  SFA   TONSILLECTOMY      Current Medications: No outpatient medications have been marked as taking for the 11/24/22 encounter (Appointment) with Fransico Michael, Marcellous Snarski H, PA-C.     Allergies:   Bee venom, Influenza virus vaccine, Ace inhibitors, Atorvastatin, Crestor [rosuvastatin], Hydrocodone bit-homatrop mbr, and Xarelto [rivaroxaban]   Social History   Socioeconomic History   Marital status: Widowed    Spouse name: Not on file   Number of children: 1   Years of education: Not on file   Highest education level: Not on file  Occupational History   Not on file  Tobacco Use   Smoking status: Former    Current packs/day: 0.00    Average packs/day: 0.5 packs/day for 20.0 years (10.0 ttl pk-yrs)    Types: Cigarettes    Start date: 07/20/1985    Quit date: 07/20/2005    Years since quitting: 17.3   Smokeless tobacco: Never  Vaping Use   Vaping status: Never Used  Substance and Sexual Activity   Alcohol use: No   Drug use: No   Sexual activity: Not on file  Other Topics Concern   Not on file  Social History Narrative   Not on file   Social Determinants of Health   Financial Resource Strain: Not on file  Food Insecurity: No Food Insecurity (10/06/2022)  Hunger Vital Sign    Worried About Running Out of Food in the Last Year: Never true    Ran Out of Food in the Last Year: Never true  Transportation Needs: No Transportation Needs (10/06/2022)   PRAPARE - Administrator, Civil Service (Medical): No    Lack of Transportation (Non-Medical): No  Physical Activity: Not on file  Stress: No Stress Concern Present (11/11/2020)   Received from [facility], [facility] - Occupational Stress Questionnaire    Feeling of Stress : Only a little  Social  Connections: Unknown (06/17/2021)   Received from [facility], [facility]   Social Network    Social Network: Not on file     Family History: The patient's ***family history includes Breast cancer in her mother; COPD in her father; Heart attack in her brother and father; Heart disease in her sister; Lung cancer in her father.  ROS:   Please see the history of present illness.    *** All other systems reviewed and are negative.  EKGs/Labs/Other Studies Reviewed:    The following studies were reviewed today: ***  EKG:  EKG is *** ordered today.  The ekg ordered today demonstrates ***  Recent Labs: 10/05/2022: ALT 7 10/06/2022: BUN 32; Creatinine, Ser 1.82; Hemoglobin 8.0; Platelets 282; Potassium 4.4; Sodium 137  Recent Lipid Panel    Component Value Date/Time   CHOL 210 (H) 04/22/2013 2306   TRIG 200 (H) 04/22/2013 2306   HDL 32 (L) 04/22/2013 2306   CHOLHDL 6.6 04/22/2013 2306   VLDL 40 04/22/2013 2306   LDLCALC 138 (H) 04/22/2013 2306     Risk Assessment/Calculations:   {Does this patient have ATRIAL FIBRILLATION?:[phone]}   Physical Exam:    VS:  There were no vitals taken for this visit.    Wt Readings from Last 3 Encounters:  10/06/22 190 lb 12.8 oz (86.5 kg)  08/01/22 192 lb 9.6 oz (87.4 kg)  12/05/21 188 lb (85.3 kg)     GEN: *** Well nourished, well developed in no acute distress HEENT: Normal NECK: No JVD; No carotid bruits LYMPHATICS: No lymphadenopathy CARDIAC: ***RRR, no murmurs, rubs, gallops RESPIRATORY:  Clear to auscultation without rales, wheezing or rhonchi  ABDOMEN: Soft, non-tender, non-distended MUSCULOSKELETAL:  No edema; No deformity  SKIN: Warm and dry NEUROLOGIC:  Alert and oriented x 3 PSYCHIATRIC:  Normal affect   ASSESSMENT:    No diagnosis found. PLAN:    In order of problems listed above:  ***  Disposition: Follow up {follow up:15908} with ***   Shared Decision Making/Informed Consent   {Are you ordering a CV  Procedure (e.g. stress test, cath, DCCV, TEE, etc)?   Press F2        :[id number]}    Signed, Daniyal Tabor David Stall, PA-C  11/24/2022 7:50 AM    [facility]

## 2022-11-30 NOTE — Progress Notes (Unsigned)
Cardiology Office Note:    Date:  12/05/2022   ID:  Carlus Pavlov, DOB 1945/10/18, MRN [id number]  PCP:  Sharmon Revere, MD  Cardiologist:  Lorine Bears, MD Electrophysiologist:  Lanier Prude, MD     Referring MD: Sharmon Revere, MD   Chief Complaint: pre-op evaluation  History of Present Illness:    Brittany Ford is a 77 y.o. female with a history of paroxysmal atrial fibrillation on Flecainide and Eliquis, recurrent near syncope/ syncope felt to be due to orthostatic hypotension, PAD, hyperlipidemia intolerant to statins, type 2 diabetes mellitus, GERD, chronic back pain, and obesity who is followed by Dr. Kirke Corin and Dr. Lalla Brothers and presents today for pre-op evaluation for laparoscopic cholecystectomy.  Patient is primarily followed for paroxysmal atrial fibrillation and PAD. She was started on Flecainide for her atrial fibrillation in 12/2018. Lexiscan Myoview later that month was low risk with no evidence of ischemia. He underwent a peripheral angiogram for evaluation of PAD in 03/2021 which showed severe diffuse SFA disease and mild to moderate bilateral iliac disease s/p successful directional atherectomy and drug-coated balloon angioplasty to the whole length of the left SFA. Most recent lower extremity dopplers in 04/2021 showed widely patent left SFA without any evidence of stenosis. ABIs were normal on the left and mildly reduced on the right.   Patient was last seen by Dr. Kirke Corin in 07/2022 at which time she reported frequent falls over the last 6 months and did not know what really happened during those episodes. There was possible LOC. 2 week Zio monitor was ordered for further evaluation and she was referred to Dr. Lalla Brothers to discuss a Watchman device. A left carotid bruit was noted on exam so carotid dopplers were also ordered. Monitor showed underlying sinus rhythm (average rate of 57 bpm) with 1st degree AV block, 2short runs of SVT (longest run 8 beats),  and rare PACs/ PVCs. Carotid dopplers showed mild stenosis (1-39%) of bilateral ICAs and a stenotic left subclavian artery.   Patient was admitted in 09/2022 after presenting for further evaluation of abnormal labs (hyperkalemia) at the advice of her PCP. for further evaluation of near syncope and possible syncope. Orthostatic vital signs were positive. Echo showed LVEF of 65-70% with normal wall motion, mild LVH, and grade 1 diastolic dysfunction. She was treated with IV fluids and Lokelma for hyperkalemia. Lisinopril and HCTZ were stopped.  Compression stockings were recommended at discharge. She was discharged on low dose Amlodipine and Toprol-XL.  Patient is in need of a laparoscopic cholecystectomy and presents today for pre-op evaluation. She denies any recurrent falls, near syncope, or syncope since her hospitalization in 09/2022 when her medications were adjusted. She is stable from a cardiac standpoint. She reports some shortness of breath if she is walking long distances due to her back pain but states this is not new and is stable. She denies any chest pain or any other shortness of breath. No orthopnea or PND. She has some chronic lower extremity (left leg chronically worse than the right) but this is stable. She denies any palpitations, lightheadedness, dizziness, or syncope. Her activity is limited due to her chronic back pain but she is still able to complete >4.0 METS of physical activity. The only thing that limits her is her back. She denies any claudication but she does states that her lower extremities are chronically cold and it is hard to find pulses. She denies any slow healing wounds or skin discoloration.   EKGs/Labs/Other Studies Reviewed:  The following studies were reviewed:  Myoview 12/31/2018: There was no ST segment deviation noted during stress. Nuclear stress EF: 71%. The left ventricular ejection fraction is hyperdynamic (>65%). The study is normal. This is a low risk  study. _______________  Peripheral Angiogram 03/23/2021: 1.  Medium size abdominal aortic aneurysm above the iliac bifurcation 2.  Mild to moderate iliac disease bilaterally. 3.  Left lower extremity: Severe diffuse SFA disease with one-vessel runoff below the knee via the peroneal artery with reconstitution of the anterior tibial and posterior tibial distally. 4.  Successful directional atherectomy and drug-coated balloon angioplasty to the whole length of the left SFA.   Recommendations: Resume Eliquis tomorrow as long as no bleeding issues from the right groin. Use clopidogrel 75 mg once daily for 1 month only.  No aspirin due to increased risk of bleeding on triple therapy. The patient is not on statin therapy as she is intolerant to statins and refused other therapies. _______________  Lower Extremity Arterial Ultrasound/ ABIs 04/13/2021: Summary: Left: Marked improvement is noted compared to previous study.   Heterogenous plaque throughout.  The entire left SFA is widely patent without evidence of stenosis, s/p  atherectomy and angioplasty.   ABI Summary: Right: Resting right ankle-brachial index indicates mild right lower  extremity arterial disease (0.84). The right toe-brachial index is abnormal.   Left: Resting left ankle-brachial index is within normal range (0.97). No  evidence of significant left lower extremity arterial disease. The left  toe-brachial index is abnormal.  _______________  Carotid Dopplers 08/03/2022: Summary:  - Right Carotid: Velocities in the right ICA are consistent with a 1-39%  stenosis.  - Left Carotid: Velocities in the left ICA are consistent with a 1-39%  stenosis.  - Vertebrals: Bilateral vertebral arteries demonstrate antegrade flow.  - Subclavians: Left subclavian artery was stenotic. Normal flow hemodynamics  were seen in the right subclavian artery.  _______________  Monitor 08/05/2022 to 08/14/2022: Patient had a min HR of 44 bpm, max  HR of 138 bpm, and avg HR of 57 bpm. Predominant underlying rhythm was Sinus Rhythm. First Degree AV Block was present. 2 Supraventricular Tachycardia runs occurred, the run with the fastest interval lasting 8 beats with a max rate of 138 bpm (avg 131 bpm); the run with the fastest interval was also the longest. Rare PACs and rare PVCs. _______________  Echocardiogram 10/06/2022: Impressions:  1. Left ventricular ejection fraction, by estimation, is 65 to 70%. The  left ventricle has normal function. The left ventricle has no regional  wall motion abnormalities. There is mild left ventricular hypertrophy.  Left ventricular diastolic parameters  are consistent with Grade I diastolic dysfunction (impaired relaxation).  Elevated left ventricular end-diastolic pressure. The E/e' is 17.   2. Right ventricular systolic function is normal. The right ventricular  size is normal. There is normal pulmonary artery systolic pressure.   3. The mitral valve is grossly normal. Trivial mitral valve  regurgitation.   4. The aortic valve is tricuspid. Aortic valve regurgitation is trivial.  Aortic valve sclerosis/calcification is present, without any evidence of  aortic stenosis. Aortic regurgitation PHT measures 824 msec.   5. The inferior vena cava is normal in size with greater than 50%  respiratory variability, suggesting right atrial pressure of 3 mmHg.   EKG:  EKG ordered today.  EKG Interpretation Date/Time:  Tuesday December 05 2022 10:41:58 EDT Ventricular Rate:  60 PR Interval:  242 QRS Duration:  90 QT Interval:  420 QTC Calculation: 420 R  Axis:   21  Text Interpretation: Sinus rhythm with 1st degree A-V block Nonspecific T wave abnormality  Underlying baseline wandering/ artifact Confirmed by Marjie Skiff [phone]) on 12/05/2022 11:03:25 AM    Recent Labs: 10/05/2022: ALT 7 10/06/2022: BUN 32; Creatinine, Ser 1.82; Hemoglobin 8.0; Platelets 282; Potassium 4.4; Sodium 137  Recent Lipid  Panel    Component Value Date/Time   CHOL 210 (H) 04/22/2013 2306   TRIG 200 (H) 04/22/2013 2306   HDL 32 (L) 04/22/2013 2306   CHOLHDL 6.6 04/22/2013 2306   VLDL 40 04/22/2013 2306   LDLCALC 138 (H) 04/22/2013 2306    Physical Exam:    Vital Signs: BP (!) 142/60   Pulse 60   Ht 5\' 11"  (1.803 m)   Wt 186 lb 6.4 oz (84.6 kg)   SpO2 96%   BMI 26.00 kg/m     Wt Readings from Last 3 Encounters:  12/05/22 186 lb 6.4 oz (84.6 kg)  10/06/22 190 lb 12.8 oz (86.5 kg)  08/01/22 192 lb 9.6 oz (87.4 kg)     General: 77 y.o. African-American female in no acute distress. HEENT: Normocephalic and atraumatic. Sclera clear.  Neck: Supple. No carotid bruits. No JVD. Heart: RRR. I-II/VI systolic murmur noted.  Lungs: No increased work of breathing. Clear to ausculation bilaterally. No wheezes, rhonchi, or rales.  Abdomen: Soft, non-distended, and non-tender to palpation.  Extremities: Trace to 1+ pitting edema of bilateral extremities (right leg chronically larger than the left)/  Skin: Warm and dry. Neuro: No focal deficits. Psych: Normal affect. Responds appropriately.   Assessment:    1. Pre-op evaluation   2. Paroxysmal atrial fibrillation (HCC)   3. PAD (peripheral artery disease) (HCC)   4. Essential hypertension   5. Orthostatic hypotension   6. Near syncope   7. Bilateral carotid artery stenosis   8. Stenosis of left subclavian artery (HCC)   9. Hyperlipidemia, unspecified hyperlipidemia type   10. Type 2 diabetes mellitus with complication, without long-term current use of insulin (HCC)     Plan:    Pre-Op Evaluation Patient is in need of a laparoscopic cholecystectomy. She is stable from a cardiac standpoint. She denies any chest pain or acute CHF symptoms. No recurrent syncope after adjustment of BP medications. She is able to complete >4.0 METS without any anginal symptoms. Per Revised Cardiac Risk Index, considered Class II risk (given intraperitoneal surgery) with a  6% chance of an adverse cardiac event perioperatively. Therefore, based on ACC/AHA guidelines, patient would be at acceptable risk for the planned procedure without further cardiovascular testing. Per Pharmacy and office protocol, patient can hold Eliquis for 2-3 days prior to procedure. Please restart this as soon as safely possible afterwards.  Would recommend continuing Flecainide and Toprol-XL perioperatively given history of atrial fibrillation. I will route this recommendation to the requesting party via Epic fax function.   Paroxysmal Atrial Fibrillation Maintaining sinus rhythm on EKG today.  - Continue Toprol-XL 12.5mg  daily.  - Continue Flecainide 50mg  twice daily. - Continue chronic anticoagulation with Eliquis 5mg  twice daily.  - Of note, she has been on Flecainide since 12/2018. She did have a Lexiscan Myoview around this time that showed no evidence of ischemia but she has never had an ETT. Prior chest CT in 11/2019 showed mild coronary artery calcifications. I do not think she would be able to walk on a treadmill with her back issues. Will defer to primary Cardiologist on whether or not any additional evaluation needs to be  done for CAD while on Flecainide given she has been on this for several years and has done well.   PAD Peripheral angiogram in 03/2021 showed severe diffuse SFA disease and mild to moderate bilateral iliac disease s/p successful directional atherectomy and drug-coated balloon angioplasty to the whole length of the left SFA. Most recent lower extremity dopplers in 04/2021 showed widely patent left SFA without any evidence of stenosis. ABIs were normal on the left and mildly reduced on the right.  - No claudication but she does report her lower extremities are constantly cold and it is difficult to find her pulses. She denies any slow healing wounds or skin discoloration.  - No Aspirin due to need for DOAC. - Intolerant to statins.  - Offered to ordered repeat ABIs and  lower extremity arterial ultrasounds but patient declined. She already had a follow-up visit with Dr. Kirke Corin scheduled for next month and will discuss this with him at that time.  Recurrent Near Syncope/ Syncope Orthostatic Hypotension Patient has had recurrent episodes of near syncope and possible syncope over the last several months. She was found to be orthostatic during an admission for this in 09/2022 which was felt to be the cause. Prior Monitor in 07/2022 showed no significant arrhythmias. Echo in 09/2022 showed 65-70% with normal wall motion, mild LVH, and grade 1 diastolic dysfunction. - No recurrence after Lisinopril and HCTZ were stopped during admission in 09/2022.  Carotid Stenosis Left Subclavian Stenosis Carotid dopplers in 07/2022 showed mild stenosis (1-39%) of bilateral ICAs and a stenotic left subclavian artery.  - No Aspirin given need for DOAC.  - Intolerant to statins.   Hypertension Patient has a history of hypertension complicated by orthostatic hypotension as described above. - BP mildly elevated at 154/76 and then 142/60 on my personal recheck at the end of visit.  - Continue Amlodipine 2.5mg  daily and Toprol-XL 12.5mg  daily.  - Asked patient to keep a log of BP/ HR and bring to follow-up visit in 01/2023. Given her history of symptomatic orthostatic hypotension with recurrent near syncope/ syncope, may need to allow for some permissive hypertension.   Hyperlipidemia Lipid panel in 06/2020 (per [facility]): Total Cholesterol 215, Triglycerides 157, HDL 38, LDL 148. - Intolerant to statins.  - Did not have time to discuss this today.  Type 2 Diabetes Mellitus Hemoglobin A1c 6.2% in 09/2022. - On Glipizide.  - Management per PCP.  Disposition: Patient already has a follow-up visit with Dr. Kirke Corin scheduled for 01/2023. Will keep this.   Signed, Corrin Parker, PA-C  12/05/2022 1:38 PM    [facility]

## 2022-12-05 ENCOUNTER — Encounter: Payer: Self-pay | Admitting: Student

## 2022-12-05 ENCOUNTER — Ambulatory Visit: Payer: Medicare Other | Attending: Medical | Admitting: Student

## 2022-12-05 VITALS — BP 142/60 | HR 60 | Ht 71.0 in | Wt 186.4 lb

## 2022-12-05 DIAGNOSIS — E118 Type 2 diabetes mellitus with unspecified complications: Secondary | ICD-10-CM

## 2022-12-05 DIAGNOSIS — I951 Orthostatic hypotension: Secondary | ICD-10-CM

## 2022-12-05 DIAGNOSIS — I480 Paroxysmal atrial fibrillation: Secondary | ICD-10-CM | POA: Diagnosis not present

## 2022-12-05 DIAGNOSIS — I10 Essential (primary) hypertension: Secondary | ICD-10-CM | POA: Diagnosis not present

## 2022-12-05 DIAGNOSIS — I739 Peripheral vascular disease, unspecified: Secondary | ICD-10-CM

## 2022-12-05 DIAGNOSIS — I6523 Occlusion and stenosis of bilateral carotid arteries: Secondary | ICD-10-CM

## 2022-12-05 DIAGNOSIS — I771 Stricture of artery: Secondary | ICD-10-CM

## 2022-12-05 DIAGNOSIS — Z01818 Encounter for other preprocedural examination: Secondary | ICD-10-CM

## 2022-12-05 DIAGNOSIS — E785 Hyperlipidemia, unspecified: Secondary | ICD-10-CM

## 2022-12-05 DIAGNOSIS — R55 Syncope and collapse: Secondary | ICD-10-CM

## 2022-12-05 NOTE — Patient Instructions (Signed)
Medication Instructions:  No Changes *If you need a refill on your cardiac medications before your next appointment, please call your pharmacy*   Lab Work: No Labs If you have labs (blood work) drawn today and your tests are completely normal, you will receive your results only by: MyChart Message (if you have MyChart) OR A paper copy in the mail If you have any lab test that is abnormal or we need to change your treatment, we will call you to review the results.   Testing/Procedures: No Testing   Follow-Up: At [facility], you and your health needs are our priority.  As part of our continuing mission to provide you with exceptional heart care, we have created designated Provider Care Teams.  These Care Teams include your primary Cardiologist (physician) and Advanced Practice Providers (APPs -  Physician Assistants and Nurse Practitioners) who all work together to provide you with the care you need, when you need it.  We recommend signing up for the patient portal called "MyChart".  Sign up information is provided on this After Visit Summary.  MyChart is used to connect with patients for Virtual Visits (Telemedicine).  Patients are able to view lab/test results, encounter notes, upcoming appointments, etc.  Non-urgent messages can be sent to your provider as well.   To learn more about what you can do with MyChart, go to [email or web address].    Your next appointment:   Keep Scheduled Appointment  Provider:   Lorine Bears, MD    Other Instructions Can Hold Eliquis 2-3 Days Prio To Procedure per Surgeons Instructions.

## 2022-12-27 ENCOUNTER — Other Ambulatory Visit: Payer: Self-pay | Admitting: Nephrology

## 2022-12-27 DIAGNOSIS — N184 Chronic kidney disease, stage 4 (severe): Secondary | ICD-10-CM

## 2023-01-03 NOTE — Pre-Procedure Instructions (Signed)
Surgical Instructions   Your procedure is scheduled on Thursday, December 5th. Report to [facility] Main Entrance "A" at 05:30 A.M., then check in with the Admitting office. Any questions or running late day of surgery: call [phone]  Questions prior to your surgery date: call [phone], Monday-Friday, 8am-4pm. If you experience any cold or flu symptoms such as cough, fever, chills, shortness of breath, etc. between now and your scheduled surgery, please notify us at the above number.     Remember:  Do not eat after midnight the night before your surgery   You may drink clear liquids until 04:30 AM the morning of your surgery.   Clear liquids allowed are: Water, Non-Citrus Juices (without pulp), Carbonated Beverages, Clear Tea (no milk, honey, etc.), Black Coffee Only (NO MILK, CREAM OR POWDERED CREAMER of any kind), and Gatorade.  Patient Instructions  The night before surgery:  No food after midnight. ONLY clear liquids after midnight   The day of surgery (if you have diabetes): Drink ONE (1) 12 oz G2 given to you in your pre admission testing appointment by 04:30 AM the morning of surgery. Drink in one sitting. Do not sip.  This drink was given to you during your hospital  pre-op appointment visit.  Nothing else to drink after completing the  12 oz bottle of G2.         If you have questions, please contact your surgeon's office.    Take these medicines the morning of surgery with A SIP OF WATER  amLODipine (NORVASC)  flecainide (TAMBOCOR)  gabapentin (NEURONTIN)  metoprolol succinate (TOPROL-XL)  traMADol Janean Sark)    May take these medicines IF NEEDED: acetaminophen (TYLENOL)    Hold Eliquis 2-3 days prior to surgery. Last dose on or before 12/2.   One week prior to surgery, STOP taking any Aspirin (unless otherwise instructed by your surgeon) Aleve, Naproxen, Ibuprofen, Motrin, Advil, Goody's, BC's, all herbal medications, fish oil, and non-prescription  vitamins.  WHAT DO I DO ABOUT MY DIABETES MEDICATION?   Do not take glipiZIDE (GLUCOTROL) the morning of surgery.    HOW TO MANAGE YOUR DIABETES BEFORE AND AFTER SURGERY  Why is it important to control my blood sugar before and after surgery? Improving blood sugar levels before and after surgery helps healing and can limit problems. A way of improving blood sugar control is eating a healthy diet by:  Eating less sugar and carbohydrates  Increasing activity/exercise  Talking with your doctor about reaching your blood sugar goals High blood sugars (greater than 180 mg/dL) can raise your risk of infections and slow your recovery, so you will need to focus on controlling your diabetes during the weeks before surgery. Make sure that the doctor who takes care of your diabetes knows about your planned surgery including the date and location.  How do I manage my blood sugar before surgery? Check your blood sugar at least 4 times a day, starting 2 days before surgery, to make sure that the level is not too high or low.  Check your blood sugar the morning of your surgery when you wake up and every 2 hours until you get to the Short Stay unit.  If your blood sugar is less than 70 mg/dL, you will need to treat for low blood sugar: Do not take insulin. Treat a low blood sugar (less than 70 mg/dL) with  cup of clear juice (cranberry or apple), 4 glucose tablets, OR glucose gel. Recheck blood sugar in 15 minutes after  treatment (to make sure it is greater than 70 mg/dL). If your blood sugar is not greater than 70 mg/dL on recheck, call [phone] for further instructions. Report your blood sugar to the short stay nurse when you get to Short Stay.  If you are admitted to the hospital after surgery: Your blood sugar will be checked by the staff and you will probably be given insulin after surgery (instead of oral diabetes medicines) to make sure you have good blood sugar levels. The goal for  blood sugar control after surgery is 80-180 mg/dL.                     Do NOT Smoke (Tobacco/Vaping) for 24 hours prior to your procedure.  If you use a CPAP at night, you may bring your mask/headgear for your overnight stay.   You will be asked to remove any contacts, glasses, piercing's, hearing aid's, dentures/partials prior to surgery. Please bring cases for these items if needed.    Patients discharged the day of surgery will not be allowed to drive home, and someone needs to stay with them for 24 hours.  SURGICAL WAITING ROOM VISITATION Patients may have no more than 2 support people in the waiting area - these visitors may rotate.   Pre-op nurse will coordinate an appropriate time for 1 ADULT support person, who may not rotate, to accompany patient in pre-op.  Children under the age of 7 must have an adult with them who is not the patient and must remain in the main waiting area with an adult.  If the patient needs to stay at the hospital during part of their recovery, the visitor guidelines for inpatient rooms apply.  Please refer to the [facility] website for the visitor guidelines for any additional information.   If you received a COVID test during your pre-op visit  it is requested that you wear a mask when out in public, stay away from anyone that may not be feeling well and notify your surgeon if you develop symptoms. If you have been in contact with anyone that has tested positive in the last 10 days please notify you surgeon.      Pre-operative CHG Bathing Instructions   You can play a key role in reducing the risk of infection after surgery. Your skin needs to be as free of germs as possible. You can reduce the number of germs on your skin by washing with CHG (chlorhexidine gluconate) soap before surgery. CHG is an antiseptic soap that kills germs and continues to kill germs even after washing.   DO NOT use if you have an allergy to chlorhexidine/CHG or antibacterial  soaps. If your skin becomes reddened or irritated, stop using the CHG and notify one of our RNs at [phone].              TAKE A SHOWER THE NIGHT BEFORE SURGERY AND THE DAY OF SURGERY    Please keep in mind the following:  DO NOT shave, including legs and underarms, 48 hours prior to surgery.   You may shave your face before/day of surgery.  Place clean sheets on your bed the night before surgery Use a clean washcloth (not used since being washed) for each shower. DO NOT sleep with pet's night before surgery.  CHG Shower Instructions:  Wash your face and private area with normal soap. If you choose to wash your hair, wash first with your normal shampoo.  After you use shampoo/soap, rinse  your hair and body thoroughly to remove shampoo/soap residue.  Turn the water OFF and apply half the bottle of CHG soap to a CLEAN washcloth.  Apply CHG soap ONLY FROM YOUR NECK DOWN TO YOUR TOES (washing for 3-5 minutes)  DO NOT use CHG soap on face, private areas, open wounds, or sores.  Pay special attention to the area where your surgery is being performed.  If you are having back surgery, having someone wash your back for you may be helpful. Wait 2 minutes after CHG soap is applied, then you may rinse off the CHG soap.  Pat dry with a clean towel  Put on clean pajamas    Additional instructions for the day of surgery: DO NOT APPLY any lotions, deodorants, cologne, or perfumes.   Do not wear jewelry or makeup Do not wear nail polish, gel polish, artificial nails, or any other type of covering on natural nails (fingers and toes) Do not bring valuables to the hospital. [facility] is not responsible for valuables/personal belongings. Put on clean/comfortable clothes.  Please brush your teeth.  Ask your nurse before applying any prescription medications to the skin.

## 2023-01-05 ENCOUNTER — Other Ambulatory Visit: Payer: Self-pay

## 2023-01-05 ENCOUNTER — Encounter (HOSPITAL_COMMUNITY): Payer: Self-pay

## 2023-01-05 ENCOUNTER — Encounter (HOSPITAL_COMMUNITY)
Admission: RE | Admit: 2023-01-05 | Discharge: 2023-01-05 | Disposition: A | Discharge status: Home / Self Care | Payer: Medicare Other | Source: Ambulatory Visit | Attending: Surgery

## 2023-01-05 VITALS — BP 158/57 | HR 57 | Temp 98.2°F | Resp 18 | Ht 70.0 in | Wt 192.0 lb

## 2023-01-05 DIAGNOSIS — Z01818 Encounter for other preprocedural examination: Secondary | ICD-10-CM | POA: Diagnosis present

## 2023-01-05 DIAGNOSIS — Z01812 Encounter for preprocedural laboratory examination: Secondary | ICD-10-CM | POA: Insufficient documentation

## 2023-01-05 DIAGNOSIS — E119 Type 2 diabetes mellitus without complications: Secondary | ICD-10-CM | POA: Diagnosis not present

## 2023-01-05 HISTORY — DX: Personal history of other diseases of the digestive system: Z87.19

## 2023-01-05 HISTORY — DX: Peripheral vascular disease, unspecified: I73.9

## 2023-01-05 HISTORY — DX: Cyst of kidney, acquired: N28.1

## 2023-01-05 HISTORY — DX: Dyspnea, unspecified: R06.00

## 2023-01-05 HISTORY — DX: Personal history of urinary calculi: Z87.442

## 2023-01-05 HISTORY — DX: Essential (primary) hypertension: I10

## 2023-01-05 HISTORY — DX: Unspecified osteoarthritis, unspecified site: M19.90

## 2023-01-05 LAB — CBC
HCT: 31.7 % — ABNORMAL LOW (ref 36.0–46.0)
Hemoglobin: 9.1 g/dL — ABNORMAL LOW (ref 12.0–15.0)
MCH: 25.2 pg — ABNORMAL LOW (ref 26.0–34.0)
MCHC: 28.7 g/dL — ABNORMAL LOW (ref 30.0–36.0)
MCV: 87.8 fL (ref 80.0–100.0)
Platelets: 301 10*3/uL (ref 150–400)
RBC: 3.61 MIL/uL — ABNORMAL LOW (ref 3.87–5.11)
RDW: 15.3 % (ref 11.5–15.5)
WBC: 6.9 10*3/uL (ref 4.0–10.5)
nRBC: 0 % (ref 0.0–0.2)

## 2023-01-05 LAB — HEMOGLOBIN A1C
Hgb A1c MFr Bld: 5.5 % (ref 4.8–5.6)
Mean Plasma Glucose: 111.15 mg/dL

## 2023-01-05 LAB — BASIC METABOLIC PANEL
Anion gap: 7 (ref 5–15)
BUN: 19 mg/dL (ref 8–23)
CO2: 22 mmol/L (ref 22–32)
Calcium: 8.6 mg/dL — ABNORMAL LOW (ref 8.9–10.3)
Chloride: 110 mmol/L (ref 98–111)
Creatinine, Ser: 1.64 mg/dL — ABNORMAL HIGH (ref 0.44–1.00)
GFR, Estimated: 32 mL/min — ABNORMAL LOW (ref >60)
Glucose, Bld: 159 mg/dL — ABNORMAL HIGH (ref 70–99)
Potassium: 4.3 mmol/L (ref 3.5–5.1)
Sodium: 139 mmol/L (ref 135–145)

## 2023-01-05 LAB — GLUCOSE, CAPILLARY: Glucose-Capillary: 137 mg/dL — ABNORMAL HIGH (ref 70–99)

## 2023-01-05 NOTE — Progress Notes (Signed)
PCP - Dr. Sharmon Revere Cardiologist - Dr. Lorine Bears EP: Dr. Steffanie Dunn Nephrologist- Dr. Terrial Rhodes  PPM/ICD - denies   Chest x-ray - 12/05/21 EKG - 12/05/22 Stress Test - 12/31/18 ECHO - 10/06/22 Cardiac Cath - denies  Sleep Study - OSA+, pt states CPAP machine never worked right, then she lost weight and no longer needed it CPAP - denies  Fasting Blood Sugar - 119-141 Checks Blood Sugar once a day  Last dose of GLP1 agonist-  n/a   Blood Thinner Instructions: Hold Eliquis 2-3 days. Last dose no later than 12/2 Aspirin Instructions: n/a  ERAS Protcol - yes PRE-SURGERY G2- given at PAT  COVID TEST- n/a   Anesthesia review: yes, cardiac hx  Patient denies shortness of breath, fever, cough and chest pain at PAT appointment   All instructions explained to the patient, with a verbal understanding of the material. Patient agrees to go over the instructions while at home for a better understanding.  The opportunity to ask questions was provided.

## 2023-01-08 ENCOUNTER — Ambulatory Visit
Admission: RE | Admit: 2023-01-08 | Discharge: 2023-01-08 | Disposition: A | Discharge status: Home / Self Care | Payer: Medicare Other | Source: Ambulatory Visit | Attending: Nephrology | Admitting: Nephrology

## 2023-01-08 DIAGNOSIS — N184 Chronic kidney disease, stage 4 (severe): Secondary | ICD-10-CM

## 2023-01-08 NOTE — Anesthesia Preprocedure Evaluation (Signed)
Anesthesia Evaluation  Patient identified by MRN, date of birth, ID band Patient awake    Reviewed: Allergy & Precautions, NPO status , Patient's Chart, lab work & pertinent test results  History of Anesthesia Complications Negative for: history of anesthetic complications  Airway Mallampati: II  TM Distance: >3 FB Neck ROM: Full    Dental  (+) Upper Dentures, Lower Dentures   Pulmonary asthma , sleep apnea , former smoker   Pulmonary exam normal breath sounds clear to auscultation       Cardiovascular hypertension, Pt. on medications and Pt. on home beta blockers + Peripheral Vascular Disease  Normal cardiovascular exam+ dysrhythmias  Rhythm:Regular Rate:Normal     Neuro/Psych  PSYCHIATRIC DISORDERS  Depression       GI/Hepatic hiatal hernia,GERD  ,,  Endo/Other  diabetes, Type 2, Oral Hypoglycemic AgentsHypothyroidism    Renal/GU   negative genitourinary   Musculoskeletal  (+) Arthritis ,    Abdominal Normal abdominal exam  (+)   Peds  Hematology  (+) Blood dyscrasia, anemia   Anesthesia Other Findings   Reproductive/Obstetrics                             Anesthesia Physical Anesthesia Plan  ASA: 3  Anesthesia Plan: General   Post-op Pain Management: Dilaudid IV   Induction: Intravenous  PONV Risk Score and Plan: 4 or greater and Ondansetron, Treatment may vary due to age or medical condition and Dexamethasone  Airway Management Planned: Oral ETT  Additional Equipment: None  Intra-op Plan:   Post-operative Plan: Extubation in OR  Informed Consent: I have reviewed the patients History and Physical, chart, labs and discussed the procedure including the risks, benefits and alternatives for the proposed anesthesia with the patient or authorized representative who has indicated his/her understanding and acceptance.     Dental advisory given  Plan Discussed with:  CRNA  Anesthesia Plan Comments: (PAT note by Antionette Poles, PA-C: 77 year old female follows with cardiology for history of paroxysmal atrial fibrillation on flecainide and Eliquis, recurrent near syncope/syncope felt to be due to orthostatic hypotension, PAD (s/p atherectomy and DES to the whole length of the left SFA), HLD intolerant to statins.  Myoview 12/2018 was low risk with no evidence of ischemia.  Event monitor June 2024 showed underlying sinus rhythm (average rate of 57 bpm) with 1st degree AV block, 2short runs of SVT (longest run 8 beats), and rare PACs/ PVCs. Carotid dopplers showed mild stenosis (1-39%) of bilateral ICAs and a stenotic left subclavian artery.  Echo 09/2022 showed LVEF of 65-70% with normal wall motion, mild LVH, and grade 1 diastolic dysfunction.   Last seen by cardiology APP Marjie Skiff, PA-C on 12/05/2022 for preop evaluation.  Per note, "Patient is in need of a laparoscopic cholecystectomy. She is stable from a cardiac standpoint. She denies any chest pain or acute CHF symptoms. No recurrent syncope after adjustment of BP medications. She is able to complete >4.0 METS without any anginal symptoms. Per Revised Cardiac Risk Index, considered Class II risk (given intraperitoneal surgery) with a 6% chance of an adverse cardiac event perioperatively. Therefore, based on ACC/AHA guidelines, patient would be at acceptable risk for the planned procedure without further cardiovascular testing. Per Pharmacy and office protocol, patient can hold Eliquis for 2-3 days prior to procedure. Please restart this as soon as safely possible afterwards.  Would recommend continuing Flecainide and Toprol-XL perioperatively given history of atrial fibrillation. I will route  this recommendation to the requesting party via Epic fax function."   Other pertinent history includes non-insulin-dependent DM2, hiatal hernia, GERD, OSA not on CPAP, CKD 3b.  Preop labs reviewed, creatinine elevated 1.64  consistent with history of CKD, chronic anemia with hemoglobin 9.1, otherwise unremarkable.  EKG 12/05/2022: Sinus rhythm with first-degree AV block.  Rate 60.  Nonspecific T wave abnormality.  Monitor 08/05/2022 to 08/14/2022: Patient had a min HR of 44 bpm, max HR of 138 bpm, and avg HR of 57 bpm. Predominant underlying rhythm was Sinus Rhythm. First Degree AV Block was present. 2 Supraventricular Tachycardia runs occurred, the run with the fastest interval lasting 8 beats with a max rate of 138 bpm (avg 131 bpm); the run with the fastest interval was also the longest. Rare PACs and rare PVCs.  Echocardiogram 10/06/2022: Impressions: 1. Left ventricular ejection fraction, by estimation, is 65 to 70%. The  left ventricle has normal function. The left ventricle has no regional  wall motion abnormalities. There is mild left ventricular hypertrophy.  Left ventricular diastolic parameters  are consistent with Grade I diastolic dysfunction (impaired relaxation).  Elevated left ventricular end-diastolic pressure. The E/e' is 17.  2. Right ventricular systolic function is normal. The right ventricular  size is normal. There is normal pulmonary artery systolic pressure.  3. The mitral valve is grossly normal. Trivial mitral valve  regurgitation.  4. The aortic valve is tricuspid. Aortic valve regurgitation is trivial.  Aortic valve sclerosis/calcification is present, without any evidence of  aortic stenosis. Aortic regurgitation PHT measures 824 msec.  5. The inferior vena cava is normal in size with greater than 50%  respiratory variability, suggesting right atrial pressure of 3 mmHg.   Myoview 12/31/2018:  There was no ST segment deviation noted during stress.  Nuclear stress EF: 71%.  The left ventricular ejection fraction is hyperdynamic (>65%).  The study is normal.  This is a low risk study.   Carotid Dopplers 08/03/2022: Summary:  - Right Carotid: Velocities in the right ICA  are consistent with a 1-39%  stenosis.  - Left Carotid: Velocities in the left ICA are consistent with a 1-39%  stenosis.  - Vertebrals:Bilateral vertebral arteries demonstrate antegrade flow.  - Subclavians: Left subclavian artery was stenotic. Normal flow hemodynamics  were seen in the right subclavian artery.    )        Anesthesia Quick Evaluation

## 2023-01-08 NOTE — Progress Notes (Signed)
Anesthesia Chart Review:  77 year old female follows with cardiology for history of paroxysmal atrial fibrillation on flecainide and Eliquis, recurrent near syncope/syncope felt to be due to orthostatic hypotension, PAD (s/p atherectomy and DES to the whole length of the left SFA), HLD intolerant to statins.  Myoview 12/2018 was low risk with no evidence of ischemia.  Event monitor June 2024 showed underlying sinus rhythm (average rate of 57 bpm) with 1st degree AV block, 2short runs of SVT (longest run 8 beats), and rare PACs/ PVCs. Carotid dopplers showed mild stenosis (1-39%) of bilateral ICAs and a stenotic left subclavian artery.  Echo 09/2022 showed LVEF of 65-70% with normal wall motion, mild LVH, and grade 1 diastolic dysfunction.   Last seen by cardiology APP Marjie Skiff, PA-C on 12/05/2022 for preop evaluation.  Per note, "Patient is in need of a laparoscopic cholecystectomy. She is stable from a cardiac standpoint. She denies any chest pain or acute CHF symptoms. No recurrent syncope after adjustment of BP medications. She is able to complete >4.0 METS without any anginal symptoms. Per Revised Cardiac Risk Index, considered Class II risk (given intraperitoneal surgery) with a 6% chance of an adverse cardiac event perioperatively. Therefore, based on ACC/AHA guidelines, patient would be at acceptable risk for the planned procedure without further cardiovascular testing. Per Pharmacy and office protocol, patient can hold Eliquis for 2-3 days prior to procedure. Please restart this as soon as safely possible afterwards.  Would recommend continuing Flecainide and Toprol-XL perioperatively given history of atrial fibrillation. I will route this recommendation to the requesting party via Epic fax function."   Other pertinent history includes non-insulin-dependent DM2, hiatal hernia, GERD, OSA not on CPAP, CKD 3b.  Preop labs reviewed, creatinine elevated 1.64 consistent with history of CKD, chronic  anemia with hemoglobin 9.1, otherwise unremarkable.  EKG 12/05/2022: Sinus rhythm with first-degree AV block.  Rate 60.  Nonspecific T wave abnormality.  Monitor 08/05/2022 to 08/14/2022: Patient had a min HR of 44 bpm, max HR of 138 bpm, and avg HR of 57 bpm. Predominant underlying rhythm was Sinus Rhythm. First Degree AV Block was present. 2 Supraventricular Tachycardia runs occurred, the run with the fastest interval lasting 8 beats with a max rate of 138 bpm (avg 131 bpm); the run with the fastest interval was also the longest. Rare PACs and rare PVCs.  Echocardiogram 10/06/2022: Impressions:  1. Left ventricular ejection fraction, by estimation, is 65 to 70%. The  left ventricle has normal function. The left ventricle has no regional  wall motion abnormalities. There is mild left ventricular hypertrophy.  Left ventricular diastolic parameters  are consistent with Grade I diastolic dysfunction (impaired relaxation).  Elevated left ventricular end-diastolic pressure. The E/e' is 17.   2. Right ventricular systolic function is normal. The right ventricular  size is normal. There is normal pulmonary artery systolic pressure.   3. The mitral valve is grossly normal. Trivial mitral valve  regurgitation.   4. The aortic valve is tricuspid. Aortic valve regurgitation is trivial.  Aortic valve sclerosis/calcification is present, without any evidence of  aortic stenosis. Aortic regurgitation PHT measures 824 msec.   5. The inferior vena cava is normal in size with greater than 50%  respiratory variability, suggesting right atrial pressure of 3 mmHg.   Myoview 12/31/2018: There was no ST segment deviation noted during stress. Nuclear stress EF: 71%. The left ventricular ejection fraction is hyperdynamic (>65%). The study is normal. This is a low risk study.    Carotid Dopplers  08/03/2022: Summary:  - Right Carotid: Velocities in the right ICA are consistent with a 1-39%  stenosis.  - Left  Carotid: Velocities in the left ICA are consistent with a 1-39%  stenosis.  - Vertebrals: Bilateral vertebral arteries demonstrate antegrade flow.  - Subclavians: Left subclavian artery was stenotic. Normal flow hemodynamics  were seen in the right subclavian artery.     Zannie Cove [facility] Short Stay Center/Anesthesiology Phone [phone] 01/08/2023 3:32 PM

## 2023-01-10 ENCOUNTER — Other Ambulatory Visit (HOSPITAL_COMMUNITY): Payer: Self-pay | Admitting: *Deleted

## 2023-01-11 ENCOUNTER — Encounter (HOSPITAL_COMMUNITY)
Admission: RE | Disposition: A | Discharge status: Home / Self Care | Payer: Self-pay | Source: Home / Self Care | Attending: Surgery

## 2023-01-11 ENCOUNTER — Encounter (HOSPITAL_COMMUNITY): Payer: Self-pay | Admitting: Surgery

## 2023-01-11 ENCOUNTER — Inpatient Hospital Stay (HOSPITAL_COMMUNITY): Admission: RE | Admit: 2023-01-11 | Payer: Medicare Other | Source: Ambulatory Visit

## 2023-01-11 ENCOUNTER — Other Ambulatory Visit: Payer: Self-pay

## 2023-01-11 ENCOUNTER — Observation Stay (HOSPITAL_COMMUNITY): Payer: Medicare Other

## 2023-01-11 ENCOUNTER — Ambulatory Visit (HOSPITAL_BASED_OUTPATIENT_CLINIC_OR_DEPARTMENT_OTHER): Payer: Medicare Other | Admitting: Anesthesiology

## 2023-01-11 ENCOUNTER — Observation Stay (HOSPITAL_COMMUNITY)
Admission: RE | Admit: 2023-01-11 | Discharge: 2023-01-12 | Disposition: A | Discharge status: Home / Self Care | Payer: Medicare Other | Attending: Surgery | Admitting: Surgery

## 2023-01-11 ENCOUNTER — Ambulatory Visit (HOSPITAL_COMMUNITY): Payer: Medicare Other | Admitting: Physician Assistant

## 2023-01-11 DIAGNOSIS — Z79899 Other long term (current) drug therapy: Secondary | ICD-10-CM | POA: Insufficient documentation

## 2023-01-11 DIAGNOSIS — I10 Essential (primary) hypertension: Secondary | ICD-10-CM | POA: Insufficient documentation

## 2023-01-11 DIAGNOSIS — Z87891 Personal history of nicotine dependence: Secondary | ICD-10-CM | POA: Diagnosis not present

## 2023-01-11 DIAGNOSIS — K8012 Calculus of gallbladder with acute and chronic cholecystitis without obstruction: Principal | ICD-10-CM | POA: Insufficient documentation

## 2023-01-11 DIAGNOSIS — K8018 Calculus of gallbladder with other cholecystitis without obstruction: Secondary | ICD-10-CM

## 2023-01-11 DIAGNOSIS — E119 Type 2 diabetes mellitus without complications: Secondary | ICD-10-CM | POA: Insufficient documentation

## 2023-01-11 DIAGNOSIS — I480 Paroxysmal atrial fibrillation: Secondary | ICD-10-CM | POA: Diagnosis not present

## 2023-01-11 DIAGNOSIS — Z7901 Long term (current) use of anticoagulants: Secondary | ICD-10-CM | POA: Diagnosis not present

## 2023-01-11 DIAGNOSIS — K8010 Calculus of gallbladder with chronic cholecystitis without obstruction: Principal | ICD-10-CM | POA: Diagnosis present

## 2023-01-11 DIAGNOSIS — E039 Hypothyroidism, unspecified: Secondary | ICD-10-CM | POA: Insufficient documentation

## 2023-01-11 HISTORY — PX: CHOLECYSTECTOMY: SHX55

## 2023-01-11 LAB — GLUCOSE, CAPILLARY
Glucose-Capillary: 79 mg/dL (ref 70–99)
Glucose-Capillary: 132 mg/dL — ABNORMAL HIGH (ref 70–99)
Glucose-Capillary: 169 mg/dL — ABNORMAL HIGH (ref 70–99)
Glucose-Capillary: 193 mg/dL — ABNORMAL HIGH (ref 70–99)

## 2023-01-11 SURGERY — LAPAROSCOPIC CHOLECYSTECTOMY WITH INTRAOPERATIVE CHOLANGIOGRAM
Anesthesia: General

## 2023-01-11 MED ORDER — CHLORHEXIDINE GLUCONATE 0.12 % MT SOLN
15.0000 mL | Freq: Once | OROMUCOSAL | Status: AC
Start: 2023-01-11 — End: 2023-01-11
  Administered 2023-01-11: 15 mL via OROMUCOSAL
  Filled 2023-01-11: qty 15

## 2023-01-11 MED ORDER — LACTATED RINGERS IV SOLN
INTRAVENOUS | Status: DC
Start: 2023-01-11 — End: 2023-01-11

## 2023-01-11 MED ORDER — ROCURONIUM BROMIDE 10 MG/ML (PF) SYRINGE
PREFILLED_SYRINGE | INTRAVENOUS | Status: AC
  Filled 2023-01-11: qty 10

## 2023-01-11 MED ORDER — HYDROMORPHONE HCL 1 MG/ML IJ SOLN
0.5000 mg | INTRAMUSCULAR | Status: DC | PRN
  Administered 2023-01-11 (×2): 0.5 mg via INTRAVENOUS
  Filled 2023-01-11 (×3): qty 0.5

## 2023-01-11 MED ORDER — MORPHINE SULFATE (PF) 4 MG/ML IV SOLN: 4.0000 mg | INTRAVENOUS | Status: DC | PRN

## 2023-01-11 MED ORDER — HYDROMORPHONE HCL 1 MG/ML IJ SOLN
INTRAMUSCULAR | Status: DC | PRN
  Administered 2023-01-11: .5 mg via INTRAVENOUS

## 2023-01-11 MED ORDER — ONDANSETRON HCL 4 MG/2ML IJ SOLN: 4.0000 mg | Freq: Once | INTRAMUSCULAR | Status: DC | PRN

## 2023-01-11 MED ORDER — ORAL CARE MOUTH RINSE: 15.0000 mL | Freq: Once | OROMUCOSAL | Status: AC

## 2023-01-11 MED ORDER — CHLORHEXIDINE GLUCONATE CLOTH 2 % EX PADS: 6.0000 | MEDICATED_PAD | Freq: Once | CUTANEOUS | Status: DC

## 2023-01-11 MED ORDER — FLECAINIDE ACETATE 50 MG PO TABS
50.0000 mg | ORAL_TABLET | Freq: Two times a day (BID) | ORAL | Status: DC
  Administered 2023-01-11 – 2023-01-12 (×3): 50 mg via ORAL
  Filled 2023-01-11 (×3): qty 1

## 2023-01-11 MED ORDER — SODIUM CHLORIDE 0.9 % IV SOLN
INTRAVENOUS | Status: DC | PRN
  Administered 2023-01-11: 15 mL

## 2023-01-11 MED ORDER — ACETAMINOPHEN 500 MG PO TABS
1000.0000 mg | ORAL_TABLET | Freq: Four times a day (QID) | ORAL | Status: DC
  Administered 2023-01-11 – 2023-01-12 (×3): 1000 mg via ORAL
  Filled 2023-01-11 (×3): qty 2

## 2023-01-11 MED ORDER — LISINOPRIL-HYDROCHLOROTHIAZIDE 10-12.5 MG PO TABS: 1.0000 | ORAL_TABLET | Freq: Every day | ORAL | Status: DC

## 2023-01-11 MED ORDER — GABAPENTIN 300 MG PO CAPS
600.0000 mg | ORAL_CAPSULE | Freq: Two times a day (BID) | ORAL | Status: DC
  Administered 2023-01-11 – 2023-01-12 (×3): 600 mg via ORAL
  Filled 2023-01-11 (×3): qty 2

## 2023-01-11 MED ORDER — SUGAMMADEX SODIUM 200 MG/2ML IV SOLN
INTRAVENOUS | Status: DC | PRN
  Administered 2023-01-11: 200 mg via INTRAVENOUS

## 2023-01-11 MED ORDER — MIDAZOLAM HCL 2 MG/2ML IJ SOLN
INTRAMUSCULAR | Status: AC
  Filled 2023-01-11: qty 2

## 2023-01-11 MED ORDER — ONDANSETRON HCL 4 MG/2ML IJ SOLN
INTRAMUSCULAR | Status: DC | PRN
  Administered 2023-01-11: 4 mg via INTRAVENOUS

## 2023-01-11 MED ORDER — ACETAMINOPHEN 10 MG/ML IV SOLN: 1000.0000 mg | Freq: Once | INTRAVENOUS | Status: DC | PRN

## 2023-01-11 MED ORDER — FENTANYL CITRATE (PF) 250 MCG/5ML IJ SOLN
INTRAMUSCULAR | Status: AC
  Filled 2023-01-11: qty 5

## 2023-01-11 MED ORDER — PROPOFOL 10 MG/ML IV BOLUS
INTRAVENOUS | Status: DC | PRN
  Administered 2023-01-11: 50 mg via INTRAVENOUS
  Administered 2023-01-11: 100 mg via INTRAVENOUS

## 2023-01-11 MED ORDER — CEFAZOLIN SODIUM-DEXTROSE 2-4 GM/100ML-% IV SOLN
2.0000 g | INTRAVENOUS | Status: AC
  Administered 2023-01-11: 2 g via INTRAVENOUS
  Filled 2023-01-11: qty 100

## 2023-01-11 MED ORDER — HYDROMORPHONE HCL 1 MG/ML IJ SOLN
INTRAMUSCULAR | Status: AC
  Filled 2023-01-11: qty 1

## 2023-01-11 MED ORDER — TRAMADOL HCL 50 MG PO TABS
50.0000 mg | ORAL_TABLET | Freq: Four times a day (QID) | ORAL | Status: DC | PRN
Start: 2023-01-11 — End: 2023-01-12

## 2023-01-11 MED ORDER — ONDANSETRON HCL 4 MG/2ML IJ SOLN: 4.0000 mg | Freq: Four times a day (QID) | INTRAMUSCULAR | Status: DC | PRN

## 2023-01-11 MED ORDER — HYDROMORPHONE HCL 1 MG/ML IJ SOLN
0.2500 mg | INTRAMUSCULAR | Status: DC | PRN
  Administered 2023-01-11 (×3): 0.5 mg via INTRAVENOUS

## 2023-01-11 MED ORDER — LISINOPRIL 10 MG PO TABS
10.0000 mg | ORAL_TABLET | Freq: Every day | ORAL | Status: DC
  Administered 2023-01-11 – 2023-01-12 (×2): 10 mg via ORAL
  Filled 2023-01-11 (×2): qty 1

## 2023-01-11 MED ORDER — SODIUM CHLORIDE 0.9 % IV SOLN: INTRAVENOUS | Status: DC | PRN

## 2023-01-11 MED ORDER — METOPROLOL SUCCINATE ER 25 MG PO TB24
12.5000 mg | ORAL_TABLET | Freq: Every day | ORAL | Status: DC
  Administered 2023-01-12: 12.5 mg via ORAL
  Filled 2023-01-11: qty 1

## 2023-01-11 MED ORDER — FENTANYL CITRATE (PF) 250 MCG/5ML IJ SOLN
INTRAMUSCULAR | Status: DC | PRN
  Administered 2023-01-11 (×3): 50 ug via INTRAVENOUS
  Administered 2023-01-11: 100 ug via INTRAVENOUS

## 2023-01-11 MED ORDER — DEXAMETHASONE SODIUM PHOSPHATE 10 MG/ML IJ SOLN
INTRAMUSCULAR | Status: DC | PRN
  Administered 2023-01-11: 5 mg via INTRAVENOUS

## 2023-01-11 MED ORDER — MIDAZOLAM HCL 2 MG/2ML IJ SOLN
INTRAMUSCULAR | Status: DC | PRN
  Administered 2023-01-11: 1 mg via INTRAVENOUS

## 2023-01-11 MED ORDER — HYDROCHLOROTHIAZIDE 12.5 MG PO TABS
12.5000 mg | ORAL_TABLET | Freq: Every day | ORAL | Status: DC
  Administered 2023-01-11 – 2023-01-12 (×2): 12.5 mg via ORAL
  Filled 2023-01-11 (×2): qty 1

## 2023-01-11 MED ORDER — BUPIVACAINE-EPINEPHRINE 0.25% -1:200000 IJ SOLN
INTRAMUSCULAR | Status: DC | PRN
  Administered 2023-01-11: 12 mL

## 2023-01-11 MED ORDER — LIDOCAINE 2% (20 MG/ML) 5 ML SYRINGE
INTRAMUSCULAR | Status: DC | PRN
  Administered 2023-01-11: 100 mg via INTRAVENOUS

## 2023-01-11 MED ORDER — AMLODIPINE BESYLATE 2.5 MG PO TABS
2.5000 mg | ORAL_TABLET | Freq: Every day | ORAL | Status: DC
  Administered 2023-01-11 – 2023-01-12 (×2): 2.5 mg via ORAL
  Filled 2023-01-11 (×2): qty 1

## 2023-01-11 MED ORDER — LIDOCAINE 2% (20 MG/ML) 5 ML SYRINGE
INTRAMUSCULAR | Status: AC
  Filled 2023-01-11: qty 5

## 2023-01-11 MED ORDER — OXYCODONE HCL 5 MG PO TABS
5.0000 mg | ORAL_TABLET | ORAL | Status: DC | PRN
  Administered 2023-01-11: 10 mg via ORAL
  Filled 2023-01-11: qty 2

## 2023-01-11 MED ORDER — PROPOFOL 10 MG/ML IV BOLUS
INTRAVENOUS | Status: AC
  Filled 2023-01-11: qty 20

## 2023-01-11 MED ORDER — ROCURONIUM BROMIDE 10 MG/ML (PF) SYRINGE
PREFILLED_SYRINGE | INTRAVENOUS | Status: DC | PRN
  Administered 2023-01-11: 60 mg via INTRAVENOUS
  Administered 2023-01-11: 10 mg via INTRAVENOUS

## 2023-01-11 MED ORDER — SODIUM CHLORIDE 0.9 % IV SOLN: 510.0000 mg | INTRAVENOUS | Status: DC

## 2023-01-11 MED ORDER — INSULIN ASPART 100 UNIT/ML IJ SOLN: 0.0000 [IU] | Freq: Three times a day (TID) | INTRAMUSCULAR | Status: DC

## 2023-01-11 MED ORDER — DIPHENHYDRAMINE HCL 50 MG/ML IJ SOLN: 12.5000 mg | Freq: Four times a day (QID) | INTRAMUSCULAR | Status: DC | PRN

## 2023-01-11 MED ORDER — ACETAMINOPHEN 10 MG/ML IV SOLN
INTRAVENOUS | Status: AC
  Filled 2023-01-11: qty 100

## 2023-01-11 MED ORDER — ONDANSETRON HCL 4 MG/2ML IJ SOLN
INTRAMUSCULAR | Status: AC
  Filled 2023-01-11: qty 2

## 2023-01-11 MED ORDER — DIPHENHYDRAMINE HCL 12.5 MG/5ML PO ELIX: 12.5000 mg | ORAL_SOLUTION | Freq: Four times a day (QID) | ORAL | Status: DC | PRN

## 2023-01-11 MED ORDER — EPHEDRINE SULFATE-NACL 50-0.9 MG/10ML-% IV SOSY
PREFILLED_SYRINGE | INTRAVENOUS | Status: DC | PRN
  Administered 2023-01-11 (×2): 5 mg via INTRAVENOUS

## 2023-01-11 MED ORDER — DEXAMETHASONE SODIUM PHOSPHATE 10 MG/ML IJ SOLN
INTRAMUSCULAR | Status: AC
  Filled 2023-01-11: qty 1

## 2023-01-11 MED ORDER — ONDANSETRON 4 MG PO TBDP: 4.0000 mg | ORAL_TABLET | Freq: Four times a day (QID) | ORAL | Status: DC | PRN

## 2023-01-11 MED ORDER — EPHEDRINE 5 MG/ML INJ
INTRAVENOUS | Status: AC
  Filled 2023-01-11: qty 5

## 2023-01-11 MED ORDER — HYDROMORPHONE HCL 1 MG/ML IJ SOLN
INTRAMUSCULAR | Status: AC
  Filled 2023-01-11: qty 0.5

## 2023-01-11 MED ORDER — GLIPIZIDE 2.5 MG HALF TABLET
2.5000 mg | ORAL_TABLET | Freq: Every day | ORAL | Status: DC
  Filled 2023-01-11: qty 1

## 2023-01-11 MED ORDER — ACETAMINOPHEN 500 MG PO TABS
1000.0000 mg | ORAL_TABLET | ORAL | Status: AC
  Administered 2023-01-11: 1000 mg via ORAL
  Filled 2023-01-11: qty 2

## 2023-01-11 SURGICAL SUPPLY — 47 items
APPLIER CLIP ROT 10 11.4 M/L (STAPLE) ×1
BAG COUNTER SPONGE SURGICOUNT (BAG) ×1 IMPLANT
BENZOIN TINCTURE PRP APPL 2/3 (GAUZE/BANDAGES/DRESSINGS) ×1 IMPLANT
BLADE CLIPPER SURG (BLADE) IMPLANT
CANISTER SUCT 3000ML PPV (MISCELLANEOUS) ×1 IMPLANT
CHLORAPREP W/TINT 26 (MISCELLANEOUS) ×1 IMPLANT
CLIP APPLIE ROT 10 11.4 M/L (STAPLE) ×1 IMPLANT
COVER MAYO STAND STRL (DRAPES) ×1 IMPLANT
COVER SURGICAL LIGHT HANDLE (MISCELLANEOUS) ×1 IMPLANT
DRAPE C-ARM 42X120 X-RAY (DRAPES) ×1 IMPLANT
DRSG TEGADERM 2-3/8X2-3/4 SM (GAUZE/BANDAGES/DRESSINGS) ×3 IMPLANT
DRSG TEGADERM 4X4.75 (GAUZE/BANDAGES/DRESSINGS) ×1 IMPLANT
ELECT PAD DSPR THERM+ ADLT (MISCELLANEOUS) IMPLANT
ELECT REM PT RETURN 9FT ADLT (ELECTROSURGICAL) ×1
ELECTRODE REM PT RTRN 9FT ADLT (ELECTROSURGICAL) ×1 IMPLANT
GAUZE SPONGE 2X2 8PLY STRL LF (GAUZE/BANDAGES/DRESSINGS) ×1 IMPLANT
GLOVE BIO SURGEON STRL SZ7 (GLOVE) ×1 IMPLANT
GLOVE BIOGEL PI IND STRL 7.5 (GLOVE) ×1 IMPLANT
GOWN STRL REUS W/ TWL LRG LVL3 (GOWN DISPOSABLE) ×3 IMPLANT
HEMOSTAT SNOW SURGICEL 2X4 (HEMOSTASIS) IMPLANT
IRRIG SUCT STRYKERFLOW 2 WTIP (MISCELLANEOUS) ×1
IRRIGATION SUCT STRKRFLW 2 WTP (MISCELLANEOUS) ×1 IMPLANT
KIT BASIN OR (CUSTOM PROCEDURE TRAY) ×1 IMPLANT
KIT IMAGING PINPOINTPAQ (MISCELLANEOUS) IMPLANT
KIT TURNOVER KIT B (KITS) ×1 IMPLANT
NS IRRIG 1000ML POUR BTL (IV SOLUTION) ×1 IMPLANT
PAD ARMBOARD 7.5X6 YLW CONV (MISCELLANEOUS) ×1 IMPLANT
POUCH RETRIEVAL ECOSAC 10 (ENDOMECHANICALS) IMPLANT
PROBE LAPAROSCOPIC 5MM W/FTSWT (MISCELLANEOUS) IMPLANT
SCISSORS LAP 5X35 DISP (ENDOMECHANICALS) ×1 IMPLANT
SET CHOLANGIOGRAPH 5 50 .035 (SET/KITS/TRAYS/PACK) ×1 IMPLANT
SET CHOLANGIOGRAPHY FRANKLIN (SET/KITS/TRAYS/PACK) IMPLANT
SET TUBE SMOKE EVAC HIGH FLOW (TUBING) ×1 IMPLANT
SLEEVE Z-THREAD 5X100MM (TROCAR) ×1 IMPLANT
SPECIMEN JAR SMALL (MISCELLANEOUS) ×1 IMPLANT
STRIP CLOSURE SKIN 1/2X4 (GAUZE/BANDAGES/DRESSINGS) ×1 IMPLANT
SUT MNCRL AB 4-0 PS2 18 (SUTURE) ×1 IMPLANT
SYS BAG RETRIEVAL 10MM (BASKET)
SYSTEM BAG RETRIEVAL 10MM (BASKET) IMPLANT
TOWEL GREEN STERILE (TOWEL DISPOSABLE) ×1 IMPLANT
TOWEL GREEN STERILE FF (TOWEL DISPOSABLE) ×1 IMPLANT
TRAY LAPAROSCOPIC MC (CUSTOM PROCEDURE TRAY) ×1 IMPLANT
TROCAR 11X100 Z THREAD (TROCAR) ×1 IMPLANT
TROCAR BALLN 12MMX100 BLUNT (TROCAR) ×1 IMPLANT
TROCAR Z-THREAD OPTICAL 5X100M (TROCAR) ×1 IMPLANT
WARMER LAPAROSCOPE (MISCELLANEOUS) ×1 IMPLANT
WATER STERILE IRR 1000ML POUR (IV SOLUTION) ×1 IMPLANT

## 2023-01-11 NOTE — Anesthesia Procedure Notes (Signed)

## 2023-01-11 NOTE — Anesthesia Postprocedure Evaluation (Signed)
Anesthesia Post Note  Patient: Brittany Ford  Procedure(s) Performed: LAPAROSCOPIC CHOLECYSTECTOMY WITH INTRAOPERATIVE CHOLANGIOGRAM     Patient location during evaluation: PACU Anesthesia Type: General Level of consciousness: awake Pain management: pain level controlled Vital Signs Assessment: post-procedure vital signs reviewed and stable Respiratory status: spontaneous breathing Cardiovascular status: stable Postop Assessment: no apparent nausea or vomiting Anesthetic complications: no  No notable events documented.  Last Vitals:  Vitals:   01/11/23 1100 01/11/23 1115  BP: 132/60 114/63  Pulse: (!) 57 (!) 56  Resp: 10 11  Temp:    SpO2: 99% 98%    Last Pain:  Vitals:   01/11/23 1045  TempSrc:   PainSc: Asleep                 Caren Macadam

## 2023-01-11 NOTE — Op Note (Signed)
Laparoscopic Cholecystectomy with IOC Procedure Note  Indications:   This is a 77 year old female on Eliquis for atrial fibrillation who presents with a one year history of right upper quadrant/ right flank pain.  She has had some nausea and diarrhea, but mainly she has constant pain in this area.  She denies any correlation to eating.  Recently, she had LFT's that were within normal limits, but Cr elevated at 2.01.  US showed cholelithiasis without evidence of acute cholecystitis.  She is referred to [place] to discuss cholecystectomy.   Pre-operative Diagnosis: Calculus of gallbladder with other cholecystitis, without mention of obstruction  Post-operative Diagnosis: Same  Surgeon: Wynona Luna   Assistants: none  Anesthesia: General endotracheal anesthesia  ASA Class: 2  Procedure Details  The patient was seen again in the Holding Room. The risks, benefits, complications, treatment options, and expected outcomes were discussed with the patient. The possibilities of reaction to medication, pulmonary aspiration, perforation of viscus, bleeding, recurrent infection, finding a normal gallbladder, the need for additional procedures, failure to diagnose a condition, the possible need to convert to an open procedure, and creating a complication requiring transfusion or operation were discussed with the patient. The likelihood of improving the patient's symptoms with return to their baseline status is good.  The patient and/or family concurred with the proposed plan, giving informed consent. The site of surgery properly noted. The patient was taken to Operating Room, identified as Brittany Ford and the procedure verified as Laparoscopic Cholecystectomy with Intraoperative Cholangiogram. A Time Out was held and the above information confirmed.  Prior to the induction of general anesthesia, antibiotic prophylaxis was administered. General endotracheal anesthesia was then administered and tolerated  well. After the induction, the abdomen was prepped with Chloraprep and draped in the sterile fashion. The patient was positioned in the supine position.  Local anesthetic agent was injected into the skin below the umbilicus and an incision made. We dissected down to the abdominal fascia with blunt dissection.  The fascia was incised vertically and we entered the peritoneal cavity bluntly.  A pursestring suture of 0-Vicryl was placed around the fascial opening.  The Hasson cannula was inserted and secured with the stay suture.  Pneumoperitoneum was then created with CO2 and tolerated well without any adverse changes in the patient's vital signs. An 11-mm port was placed in the subxiphoid position.  Two 5-mm ports were placed in the right upper quadrant. All skin incisions were infiltrated with a local anesthetic agent before making the incision and placing the trocars.   We positioned the patient in reverse Trendelenburg, tilted slightly to the patient's left.  The gallbladder was identified, the fundus grasped and retracted cephalad. Adhesions were lysed bluntly and with the electrocautery where indicated, taking care not to injure any adjacent organs or viscus. The infundibulum was grasped and retracted laterally, exposing the peritoneum overlying the triangle of Calot. This was then divided and exposed in a blunt fashion. A critical view of the cystic duct and cystic artery was obtained.  The cystic duct was clearly identified and bluntly dissected circumferentially. The cystic duct was ligated with a clip distally.   An incision was made in the cystic duct and the [facility] cholangiogram catheter introduced. The catheter was secured using a clip. A cholangiogram was then obtained which showed good visualization of the distal and proximal biliary tree with no sign of filling defects or obstruction.  Contrast flowed easily into the duodenum. The catheter was then removed.  The cystic duct was then ligated with  clips and divided. The cystic artery was identified, dissected free, ligated with clips and divided as well.   The gallbladder was dissected from the liver bed in retrograde fashion with the electrocautery.  We encountered a venous lake in the gallbladder fossa.  We used a laparoscopic argon beam coagulator to achieve hemostasis, along with some Surgicel SNOW.  The gallbladder was removed and placed in an Eco sac. The liver bed was irrigated and inspected. No further bleeding was noted.   Copious irrigation was utilized and was repeatedly aspirated until clear.  The gallbladder and Eco sac were then removed through the umbilical port site.  The pursestring suture was used to close the umbilical fascia.    We again inspected the right upper quadrant for hemostasis.  Pneumoperitoneum was released as we removed the trocars.  4-0 Monocryl was used to close the skin.   Benzoin, steri-strips, and clean dressings were applied. The patient was then extubated and brought to the recovery room in stable condition. Instrument, sponge, and needle counts were correct at closure and at the conclusion of the case.   Findings: Cholecystitis with Cholelithiasis  Estimated Blood Loss: 200 mL         Drains: none         Specimens: Gallbladder           Complications: None; patient tolerated the procedure well.         Disposition: PACU - hemodynamically stable.         Condition: stable  [name]. Corliss Skains, MD, [facility] Surgery  General Surgery   01/11/2023 9:46 AM

## 2023-01-11 NOTE — Transfer of Care (Signed)
Immediate Anesthesia Transfer of Care Note  Patient: Brittany Ford  Procedure(s) Performed: LAPAROSCOPIC CHOLECYSTECTOMY WITH INTRAOPERATIVE CHOLANGIOGRAM  Patient Location: PACU  Anesthesia Type:General  Level of Consciousness: awake, alert , and oriented  Airway & Oxygen Therapy: Patient connected to face mask oxygen  Post-op Assessment: Report given to RN, Post -op Vital signs reviewed and stable, and Patient moving all extremities X 4  Post vital signs: Reviewed and stable  Last Vitals:  Vitals Value Taken Time  BP 147/98 01/11/23 0925  Temp    Pulse 66 01/11/23 0927  Resp 14 01/11/23 0927  SpO2 97 % 01/11/23 0927  Vitals shown include unfiled device data.  Last Pain:  Vitals:   01/11/23 0615  TempSrc: Oral  PainSc: 8          Complications: No notable events documented.

## 2023-01-11 NOTE — H&P (Signed)
Chief Complaint: New Consultation (Cholecystectomy)   Cardiology - Ar[place]   History of Present Illness: Brittany Ford is a 77 y.o. female who is seen today as an office consultation at the request of Dr. Delia Chimes for evaluation of New Consultation (Cholecystectomy) .     This is a 77 year old female on Eliquis for atrial fibrillation who presents with a one year history of right upper quadrant/ right flank pain.  She has had some nausea and diarrhea, but mainly she has constant pain in this area.  She denies any correlation to eating.  Recently, she had LFT's that were within normal limits, but Cr elevated at 2.01.  US showed cholelithiasis without evidence of acute cholecystitis.  She is referred to [place] to discuss cholecystectomy.  She was hospitalized in late August 2024 for weakness and syncope.  She has not seen Cardiology since discharge.     Review of Systems: A complete review of systems was obtained from the patient.  I have reviewed this information and discussed as appropriate with the patient.  See HPI as well for other ROS.   Review of Systems  Constitutional: Negative.   HENT: Negative.    Eyes: Negative.   Respiratory: Negative.    Cardiovascular: Negative.   Gastrointestinal:  Positive for abdominal pain, diarrhea and nausea.  Genitourinary: Negative.   Musculoskeletal: Negative.   Skin: Negative.   Neurological:  Positive for weakness.  Endo/Heme/Allergies: Negative.   Psychiatric/Behavioral: Negative.          Medical History: Past Medical History Past Medical History: DiagnosisDate            Arthritis                         Diabetes mellitus without complication (CMS/HHS-HCC)                  Hyperlipidemia                    Problem List Patient Active Problem List Diagnosis Hyperlipidemia, unspecified AKI (acute kidney injury) (CMS-HCC) Anemia associated with acute blood loss Anemia, unspecified Aortic aneurysm (CMS-HCC) Arthropathy Asthma  (HHS-HCC) Bilateral lower extremity edema Chronic anticoagulation Degeneration of cervical intervertebral disc Depression Dizziness Dyslipidemia Esophageal reflux Essential hypertension Hyperglycemia due to type 2 diabetes mellitus (CMS/HHS-HCC) Hyperkalemia Hypothyroidism Insomnia, unspecified Iron deficiency anemia Kidney stone Left lower quadrant pain Arthropathy of spinal facet joint Major depressive disorder, recurrent episode (CMS-HCC) Mitral regurgitation Obstructive sleep apnea syndrome Other intervertebral disc degeneration, lumbosacral region without mention of lumbar back pain or lower extremity pain Paroxysmal A-fib (CMS/HHS-HCC) Pure hypercholesterolemia Statin myopathy Syncope Type 2 diabetes mellitus (CMS/HHS-HCC) Unsatisfactory cytologic smear of vagina Vitamin D deficiency       Past Surgical History History reviewed. No pertinent surgical history.     Allergies Allergies AllergenReactions Haemophilus Influenzae Type BShortness Of Breath Hydrocodone-HomatropineItching Venom-Honey BeeAnaphylaxis AtorvastatinOther (See Comments)                         Muscle aches and cramping RivaroxabanOther (See Comments)                         GI bleed       Medications Ordered Prior to Encounter Current Outpatient Medications on File Prior to Visit MedicationSigDispenseRefill            amLODIPine (NORVASC) 2.5 MG tablet  Take 2.5 mg by mouth once daily                               apixaban (ELIQUIS) 5 mg tablet        Take 5 mg by mouth 2 (two) times daily                                 ferrous sulfate 325 (65 FE) MG tablet            Take 325 mg by mouth once daily                              flecainide (TAMBOCOR) 50 MG tablet          Take 1 tablet by mouth 2 (two) times daily                             gabapentin (NEURONTIN) 300 MG capsule Take 300 mg by mouth 4 (four) times daily                               metoprolol succinate (TOPROL-XL)  25 MG XL tablet           Take 12.5 mg by mouth once daily                                 traMADoL (ULTRAM) 50 mg tablet    TAKE 2 TABLETS (100 MG) BY MOUTH TWICE A DAY FOR HIP AND BACK PAIN .                              No current facility-administered medications on file prior to visit.       Family History Family History ProblemRelationAge of Onset            Breast cancer  Mother              Stroke  Father          Tobacco Use History Social History     Tobacco Use Smoking StatusFormer Types:Cigarettes Smokeless TobaccoNot on file       Social History Social History     Socioeconomic History Marital status:Widowed Tobacco Use Smoking status:Former                         Types: Cigarettes Vaping Use Vaping status:Unknown Substance and Sexual Activity Alcohol use:Not Currently Drug JXB:[id number]     Social Drivers of Health     Food Insecurity: No Food Insecurity (10/06/2022)             Received [facility]             Hunger Vital Sign                        Worried About Running Out of Food in the Last Year: Never true                        Ran Out of  Food in the Last Year: Never true Transportation Needs: No Transportation Needs (10/06/2022)             [facility] - Transportation                        Lack of Transportation (Medical): No                        Lack of Transportation (Non-Medical): No Stress: No Stress Concern Present (11/11/2020)             Received from [facility] - Occupational Stress Questionnaire                        Feeling of Stress : Only a little             Received from [facility]             Social Network       Objective:     Vitals:             11/22/22 1410 BP:(!) 154/80 Pulse:79 Temp:36.4 C (97.5 F) Weight:85.8 kg (189 lb 3.2 oz) Height:180.3 cm (5\' 11" ) PainSc:10-Worst pain ever   Body mass index is  26.39 kg/m.   Physical Exam    Constitutional:  WDWN in NAD, conversant, no obvious deformities; lying in bed comfortably Eyes:  Pupils equal, round; sclera anicteric; moist conjunctiva; no lid lag HENT:  Oral mucosa moist; good dentition  Neck:  No masses palpated, trachea midline; no thyromegaly Lungs:  CTA bilaterally; normal respiratory effort CV:  Regular rate and rhythm; no murmurs; extremities well-perfused with no edema Abd:  +bowel sounds, soft, mild RUQ tenderness radiating around to her right flank, no palpable organomegaly; no palpable hernias Musc:  slow gait; no apparent clubbing or cyanosis in extremities Lymphatic:  No palpable cervical or axillary lymphadenopathy Skin:  Warm, dry; no sign of jaundice Psychiatric - alert and oriented x 4; calm mood and affect     Labs, Imaging and Diagnostic Testing: CLINICAL DATA:  Right upper quadrant pain   EXAM: ULTRASOUND ABDOMEN LIMITED RIGHT UPPER QUADRANT   COMPARISON:  None Available.   FINDINGS: Gallbladder:   Cholelithiasis. No gallbladder wall thickening or pericholecystic fluid. Sonographer reports positive sonographic Murphy's sign.   Common bile duct:   Diameter: 3.4 mm   Liver:   No focal lesion identified. Within normal limits in parenchymal echogenicity. Portal vein is patent on color Doppler imaging with normal direction of blood flow towards the liver.   Other: Note is made of a 4.6 cm right renal cyst. No imaging follow-up needed.   IMPRESSION: Cholelithiasis without gallbladder wall thickening or pericholecystic fluid. Sonographer reports positive sonographic Murphy's sign. Recommend clinical correlation. Consider HIDA scan as clinically warranted.   These results will be called to the ordering clinician or representative by the Radiologist Assistant, and communication documented in the PACS or [employer].     Electronically Signed   By: Annia Belt M.D.   On: 10/27/2022 09:46    Assessment and Plan: Diagnoses and all orders for this visit:   Calculus of gallbladder with chronic cholecystitis without obstruction       We will try to obtain cardiac  clearance from Dr. Kirke Corin.  She will need to stop her Eliquis prior to surgery.  Recommend laparoscopic cholecystectomy with intraoperative cholangiogram.  The surgical procedure has been discussed with the patient.  Potential risks, benefits, alternative treatments, and expected outcomes have been explained.  All of the patient's questions at this time have been answered.  The likelihood of reaching the patient's treatment goal is good.  The patient understands the proposed surgical procedure and wishes to proceed.   Due to her medical comorbidities, we will probably keep her overnight after performing her surgery at [facility].    [name]. Corliss Skains, MD, [facility] Surgery  General Surgery   01/11/2023 7:15 AM

## 2023-01-12 ENCOUNTER — Encounter (HOSPITAL_COMMUNITY): Payer: Self-pay | Admitting: Surgery

## 2023-01-12 DIAGNOSIS — K8012 Calculus of gallbladder with acute and chronic cholecystitis without obstruction: Secondary | ICD-10-CM | POA: Diagnosis not present

## 2023-01-12 LAB — CBC
HCT: 23.8 % — ABNORMAL LOW (ref 36.0–46.0)
Hemoglobin: 7.3 g/dL — ABNORMAL LOW (ref 12.0–15.0)
MCH: 26.4 pg (ref 26.0–34.0)
MCHC: 30.7 g/dL (ref 30.0–36.0)
MCV: 86.2 fL (ref 80.0–100.0)
Platelets: 252 10*3/uL (ref 150–400)
RBC: 2.76 MIL/uL — ABNORMAL LOW (ref 3.87–5.11)
RDW: 15.2 % (ref 11.5–15.5)
WBC: 9.6 10*3/uL (ref 4.0–10.5)
nRBC: 0 % (ref 0.0–0.2)

## 2023-01-12 LAB — SURGICAL PATHOLOGY

## 2023-01-12 LAB — GLUCOSE, CAPILLARY: Glucose-Capillary: 186 mg/dL — ABNORMAL HIGH (ref 70–99)

## 2023-01-12 LAB — COMPREHENSIVE METABOLIC PANEL
ALT: 49 U/L — ABNORMAL HIGH (ref 0–44)
AST: 82 U/L — ABNORMAL HIGH (ref 15–41)
Albumin: 2.8 g/dL — ABNORMAL LOW (ref 3.5–5.0)
Alkaline Phosphatase: 74 U/L (ref 38–126)
Anion gap: 7 (ref 5–15)
BUN: 33 mg/dL — ABNORMAL HIGH (ref 8–23)
CO2: 20 mmol/L — ABNORMAL LOW (ref 22–32)
Calcium: 7.9 mg/dL — ABNORMAL LOW (ref 8.9–10.3)
Chloride: 104 mmol/L (ref 98–111)
Creatinine, Ser: 2.4 mg/dL — ABNORMAL HIGH (ref 0.44–1.00)
GFR, Estimated: 20 mL/min — ABNORMAL LOW (ref >60)
Glucose, Bld: 158 mg/dL — ABNORMAL HIGH (ref 70–99)
Potassium: 5.1 mmol/L (ref 3.5–5.1)
Sodium: 131 mmol/L — ABNORMAL LOW (ref 135–145)
Total Bilirubin: 0.3 mg/dL (ref <1.2)
Total Protein: 5.5 g/dL — ABNORMAL LOW (ref 6.5–8.1)

## 2023-01-12 MED ORDER — OXYCODONE HCL 5 MG PO TABS: 5.0000 mg | ORAL_TABLET | Freq: Four times a day (QID) | ORAL | 0 refills | Status: AC | PRN

## 2023-01-12 NOTE — Discharge Summary (Signed)
Physician Discharge Summary  Patient ID: Brittany Ford MRN: [id number] DOB/AGE: [id number] y.o.  Admit date: 01/11/2023 Discharge date: 01/12/2023  Admission Diagnoses:  Chronic calculus cholecystitis  Discharge Diagnoses: Same Principal Problem:   Chronic cholecystitis with calculus   Discharged Condition: good  Hospital Course: Laparoscopic cholecystectomy with [facility] 01/11/23.  She had some bleeding from a venous lake on the posterior surface of the gallbladder, which was stopped with argon.  Hemodynamically stable overnight.  Chronic anemia.  Hgb on POD #1 7.3.  Hgb 3 months ago 8.0.  Hgb last week 9.1.    No nausea or vomiting.  Pain in right shoulder and in her abdomen with movement.    Treatments: Lap chole with IOC  Discharge Exam: Blood pressure (!) 171/56, pulse 63, temperature 98.5 F (36.9 C), resp. rate 17, height 5\' 10"  (1.778 m), weight 87.5 kg, SpO2 98%. WDWN in NAD Abd - soft, incisional tenderness Dressings clean and dry  Disposition: Discharge disposition: 01-Home or Self Care       Discharge Instructions     Call MD for:  persistant nausea and vomiting   Complete by: As directed    Call MD for:  redness, tenderness, or signs of infection (pain, swelling, redness, odor or green/yellow discharge around incision site)   Complete by: As directed    Call MD for:  severe uncontrolled pain   Complete by: As directed    Call MD for:  temperature >100.4   Complete by: As directed    Diet general   Complete by: As directed    Driving Restrictions   Complete by: As directed    Do not drive while taking pain medications   Increase activity slowly   Complete by: As directed    May shower / Bathe   Complete by: As directed       Allergies as of 01/12/2023       Reactions   Bee Venom Anaphylaxis   Haemophilus B Polysaccharide Vaccine Shortness Of Breath   Hydrocodone Bit-homatrop Mbr Other (See Comments), Itching   "Out of body experiences"    Influenza Virus Vaccine Shortness Of Breath   Atorvastatin Other (See Comments)   Muscle aches and cramping Other Reaction(s): Other (See Comments)   Rivaroxaban    GI bleed Other Reaction(s): Other (See Comments)   Ace Inhibitors    Hyperkalemia   Crestor [rosuvastatin] Other (See Comments)   Muscle aches/cramping        Medication List     TAKE these medications    Accu-Chek Aviva device by Other route. Use as instructed   Accu-Chek FastClix Lancets Misc See admin instructions.   acetaminophen 650 MG CR tablet Commonly known as: TYLENOL Take 650-1,300 mg by mouth every 8 (eight) hours as needed for pain.   amLODipine 2.5 MG tablet Commonly known as: NORVASC Take 1 tablet (2.5 mg total) by mouth daily.   apixaban 5 MG Tabs tablet Commonly known as: ELIQUIS Take 1 tablet (5 mg total) by mouth 2 (two) times daily.   ferrous sulfate 325 (65 FE) MG tablet Take 325 mg by mouth 2 (two) times a week.   flecainide 50 MG tablet Commonly known as: TAMBOCOR TAKE 1 TABLET BY MOUTH TWICE A DAY   gabapentin 300 MG capsule Commonly known as: NEURONTIN Take 600 mg by mouth 2 (two) times daily.   glipiZIDE 5 MG tablet Commonly known as: GLUCOTROL TAKE 0.5 TABLETS (2.5 MG TOTAL) BY MOUTH DAILY BEFORE BREAKFAST.   glucose  blood test strip Use to check blood sugars   Accu-Chek Guide test strip Generic drug: glucose blood   lisinopril-hydrochlorothiazide 10-12.5 MG tablet Commonly known as: ZESTORETIC Take 1 tablet by mouth daily.   metoprolol succinate 25 MG 24 hr tablet Commonly known as: TOPROL-XL Take 0.5 tablets (12.5 mg total) by mouth daily.   multivitamin with minerals Tabs tablet Take 1 tablet by mouth in the morning.   oxyCODONE 5 MG immediate release tablet Commonly known as: Oxy IR/ROXICODONE Take 1 tablet (5 mg total) by mouth every 6 (six) hours as needed for moderate pain (pain score 4-6).   traMADol 50 MG tablet Commonly known as: ULTRAM Take  150 mg by mouth every 6 (six) hours.   Vitamin D 50 MCG (2000 UT) Caps Take 4,000 Units by mouth daily.        Follow-up Information     Manus Rudd, MD Follow up in 3 week(s).   Specialty: General Surgery Contact information: [address] [place] [address] [place] [address] [phone]                 Signed: Wynona Luna 01/12/2023, 8:47 AM

## 2023-01-12 NOTE — Progress Notes (Signed)
Patient loss IV access at 2300, She refused another stating she will be going home soon and doesn't want to be poked again.   Garwin Brothers RN

## 2023-01-12 NOTE — Discharge Instructions (Signed)

## 2023-01-18 ENCOUNTER — Encounter (HOSPITAL_COMMUNITY): Payer: Medicare Other

## 2023-01-21 ENCOUNTER — Other Ambulatory Visit: Payer: Self-pay | Admitting: Internal Medicine

## 2023-01-23 ENCOUNTER — Ambulatory Visit: Payer: Medicare Other | Admitting: Cardiovascular Disease

## 2023-02-05 ENCOUNTER — Telehealth: Payer: Self-pay

## 2023-02-05 NOTE — Telephone Encounter (Signed)
Upon chart review, the patient called and cancelled her 02/06/2023 [facility] consult with Dr. Lalla Brothers because "she does not need this" per scheduler documentation.

## 2023-02-06 ENCOUNTER — Ambulatory Visit: Payer: Medicare Other | Admitting: Cardiology

## 2023-02-12 NOTE — Progress Notes (Signed)
 Cardiology Office Note   Date:  02/13/2023   ID:  Brittany Ford, DOB 16-Jul-1945, MRN [id number]  PCP:  Haze Kingfisher, MD  Cardiologist:  Dr. Darron  No chief complaint on file.     History of Present Illness: Brittany Ford is a 78 y.o. female who is here today for follow-up visit regarding peripheral arterial disease.    She has history of paroxysmal atrial fibrillation on flecainide , type 2 diabetes, hyperlipidemia with poor tolerance to statins, chronic back pain, GERD and obesity.  She is not a smoker. She was seen in 2022 for atypical left leg claudication but her symptoms were felt to be multifactorial overall with contribution from her spine disease and as well arthritis.  She underwent lower extremity arterial Doppler in June of 2022 which showed an ABI of 0.84 on the right and 0.65 on the left.  Duplex showed moderate SFA disease on the right side and significant proximal/mid SFA disease on the left.  She was initially treated medically for claudication but she returned with worsening left leg symptoms.  Angiography was performed in February 2023 which showed medium size abdominal aortic aneurysm above the iliac bifurcation, mild to moderate iliac disease bilaterally and severe diffuse SFA disease with one-vessel runoff below the knee via the peroneal artery.  I performed successful directional atherectomy and drug-coated balloon angioplasty to the whole length of the left SFA.  Postprocedure ABI improved to 0.97 on the left with patent left SFA.  During last office visit with me, she was noted to have recurrent falls and dizziness.  She had outpatient monitor done which overall showed no significant arrhythmia to explain loss of consciousness.  Carotid Doppler showed mild bilateral nonobstructive disease.  She was hospitalized in August with hyperkalemia and near syncope.  She was orthostatic.  Echocardiogram showed normal LV systolic function.  She improved with  hydration.  Lisinopril  and hydrochlorothiazide  were initially discontinued but then resumed at a lower dose.  She underwent laparoscopic cholecystectomy last month with resolution of abdominal pain.  She denies chest pain or worsening dyspnea.  She does report some numbness in her left toes.  She had no recurrent falls or dizziness.  She does have chronic kidney disease and follows with nephrology.  In addition, she is anemic but no obvious signs of bleeding.  She does not like taking ferrous sulfate because of constipation.  Past Medical History:  Diagnosis Date   Arthritis    Atrial fibrillation (HCC)    Atrial flutter (HCC)    Chest pain    with typical and atypical qualities   Chronic low back pain    Diabetes mellitus without complication (HCC)    Diet Controlled   Dyspnea    with exertion (walking long distances)   Dysrhythmia    ATRIAL FIBRILATION   GERD (gastroesophageal reflux disease)    History of hiatal hernia    History of kidney stones    Hypertension    Obesity    Peripheral arterial disease (HCC)    Renal cyst, acquired, left    Renal cyst, acquired, right     Past Surgical History:  Procedure Laterality Date   ABDOMINAL AORTOGRAM W/LOWER EXTREMITY Left 03/23/2021   Procedure: ABDOMINAL AORTOGRAM W/LOWER EXTREMITY;  Surgeon: Darron Deatrice LABOR, MD;  Location: [facility];  Service: Cardiovascular;  Laterality: Left;   BREAST EXCISIONAL BIOPSY Right 1990s   benign   BREAST EXCISIONAL BIOPSY Left 1990s   benign   CHOLECYSTECTOMY N/A 01/11/2023  Procedure: LAPAROSCOPIC CHOLECYSTECTOMY WITH INTRAOPERATIVE CHOLANGIOGRAM;  Surgeon: Belinda Cough, MD;  Location: [facility] OR;  Service: General;  Laterality: N/A;   COLONOSCOPY  03/01/2012   Procedure: COLONOSCOPY;  Surgeon: Norleen JAYSON Hint, MD;  Location: [facility] ENDOSCOPY;  Service: Endoscopy;  Laterality: N/A;   ESOPHAGOGASTRODUODENOSCOPY  02/29/2012   Procedure: ESOPHAGOGASTRODUODENOSCOPY (EGD);  Surgeon: Norleen JAYSON Hint, MD;   Location: [facility] ENDOSCOPY;  Service: Endoscopy;  Laterality: N/A;   FOOT SURGERY Bilateral    x2   HYSTEROSCOPY WITH D & C N/A 02/09/2017   Procedure: DILATATION AND CURETTAGE /HYSTEROSCOPY;  Surgeon: Rosalva Sawyer, MD;  Location: [facility] ORS;  Service: Gynecology;  Laterality: N/A;  w/Myosure for Polyp   PERIPHERAL VASCULAR ATHERECTOMY Left 03/23/2021   Procedure: PERIPHERAL VASCULAR ATHERECTOMY;  Surgeon: Darron Deatrice LABOR, MD;  Location: [facility];  Service: Cardiovascular;  Laterality: Left;  SFA   TONSILLECTOMY       Current Outpatient Medications  Medication Sig Dispense Refill   Accu-Chek FastClix Lancets MISC See admin instructions.     ACCU-CHEK GUIDE test strip      acetaminophen  (TYLENOL ) 650 MG CR tablet Take 650-1,300 mg by mouth every 8 (eight) hours as needed for pain.     amLODipine  (NORVASC ) 2.5 MG tablet Take 1 tablet (2.5 mg total) by mouth daily. 30 tablet 3   apixaban  (ELIQUIS ) 5 MG TABS tablet Take 1 tablet (5 mg total) by mouth 2 (two) times daily. 28 tablet 0   Blood Glucose Monitoring Suppl (ACCU-CHEK AVIVA) device by Other route. Use as instructed     Cholecalciferol  (VITAMIN D) 2000 units CAPS Take 4,000 Units by mouth daily.     ferrous sulfate 325 (65 FE) MG tablet Take 325 mg by mouth 2 (two) times a week.     flecainide  (TAMBOCOR ) 50 MG tablet TAKE 1 TABLET BY MOUTH TWICE A DAY 180 tablet 3   gabapentin  (NEURONTIN ) 300 MG capsule Take 600 mg by mouth 2 (two) times daily.     glucose blood test strip Use to check blood sugars     lisinopril -hydrochlorothiazide  (ZESTORETIC ) 10-12.5 MG tablet Take 1 tablet by mouth daily.     metoprolol  succinate (TOPROL -XL) 25 MG 24 hr tablet Take 0.5 tablets (12.5 mg total) by mouth daily. 30 tablet 3   Multiple Vitamin (MULTIVITAMIN WITH MINERALS) TABS tablet Take 1 tablet by mouth in the morning.     oxyCODONE  (OXY IR/ROXICODONE ) 5 MG immediate release tablet Take 1 tablet (5 mg total) by mouth every 6 (six) hours as needed  for moderate pain (pain score 4-6). 30 tablet 0   traMADol  (ULTRAM ) 50 MG tablet Take 150 mg by mouth every 6 (six) hours.     glipiZIDE  (GLUCOTROL ) 5 MG tablet TAKE 0.5 TABLETS (2.5 MG TOTAL) BY MOUTH DAILY BEFORE BREAKFAST. (Patient not taking: Reported on 02/13/2023) 30 tablet 0   No current facility-administered medications for this visit.    Allergies:   Bee venom, Haemophilus b polysaccharide vaccine, Hydrocodone  bit-homatrop mbr, Influenza virus vaccine, Atorvastatin , Rivaroxaban, Ace inhibitors, and Crestor [rosuvastatin]    Social History:  The patient  reports that she quit smoking about 17 years ago. Her smoking use included cigarettes. She started smoking about 37 years ago. She has a 10 pack-year smoking history. She has never used smokeless tobacco. She reports that she does not drink alcohol and does not use drugs.   Family History:  The patient's family history includes Breast cancer in her mother; COPD in her father; Heart  attack in her brother and father; Heart disease in her sister; Lung cancer in her father.    ROS:  Please see the history of present illness.   Otherwise, review of systems are positive for none.   All other systems are reviewed and negative.    PHYSICAL EXAM: VS:  BP 132/78 (BP Location: Left Arm, Patient Position: Sitting, Cuff Size: Normal)   Pulse 65   Ht 5' 10 (1.778 m)   Wt 196 lb 12.8 oz (89.3 kg)   SpO2 98%   BMI 28.24 kg/m  , BMI Body mass index is 28.24 kg/m. GEN: Well nourished, well developed, in no acute distress  HEENT: normal  Neck: no JVD, or masses.  Left carotid bruit Cardiac: RRR; no murmurs, rubs, or gallops,no edema  Respiratory:  clear to auscultation bilaterally, normal work of breathing GI: soft, nontender, nondistended, + BS MS: no deformity or atrophy  Skin: warm and dry, no rash Neuro:  Strength and sensation are intact Psych: euthymic mood, full affect    EKG:  EKG is not ordered today.     Recent  Labs: 01/12/2023: ALT 49; BUN 33; Creatinine, Ser 2.40; Hemoglobin 7.3; Platelets 252; Potassium 5.1; Sodium 131    Lipid Panel    Component Value Date/Time   CHOL 210 (H) 04/22/2013 2306   TRIG 200 (H) 04/22/2013 2306   HDL 32 (L) 04/22/2013 2306   CHOLHDL 6.6 04/22/2013 2306   VLDL 40 04/22/2013 2306   LDLCALC 138 (H) 04/22/2013 2306      Wt Readings from Last 3 Encounters:  02/13/23 196 lb 12.8 oz (89.3 kg)  01/11/23 193 lb (87.5 kg)  01/05/23 192 lb (87.1 kg)            No data to display            ASSESSMENT AND PLAN:  1.  Peripheral arterial disease: Status post endovascular intervention on the left SFA.  She reports increased numbness in her left toes.  Pulses are difficult to palpate.  I requested follow-up ABI and duplex.  I do suspect that anemia is contributing to some of her symptoms.  2.  Paroxysmal atrial fibrillation: She is on long-term anticoagulation with Eliquis .  Given recurrent falls and anemia, she was referred to Dr. Cindie to consider Watchman device placement but she is not interested at the present time as she reports multiple health issues going on.  3.  Hyperlipidemia: Her LDL is usually close to 150 and she reports intolerance to rosuvastatin and atorvastatin .  She refuses any further treatment of hyperlipidemia.  4.  Essential hypertension: Blood pressure is controlled on current medications.  5.  Chronic kidney disease: She follows with nephrology.  6.  Anemia: This worsened after her cholecystectomy with a hemoglobin in the 7 range.  She denies active bleeding.  I encouraged her to take ferrous sulfate on a regular basis but she worries about constipation.  She is also not interested in IV iron.    Disposition:   Follow-up with me in 6 months.  Signed,  Deatrice Cage, MD  02/13/2023 9:50 AM    [facility]

## 2023-02-13 ENCOUNTER — Ambulatory Visit: Payer: Medicare Other | Attending: Cardiovascular Disease | Admitting: Cardiovascular Disease

## 2023-02-13 ENCOUNTER — Encounter: Payer: Self-pay | Admitting: Cardiovascular Disease

## 2023-02-13 VITALS — BP 132/78 | HR 65 | Ht 70.0 in | Wt 196.8 lb

## 2023-02-13 DIAGNOSIS — E785 Hyperlipidemia, unspecified: Secondary | ICD-10-CM

## 2023-02-13 DIAGNOSIS — I480 Paroxysmal atrial fibrillation: Secondary | ICD-10-CM | POA: Diagnosis not present

## 2023-02-13 DIAGNOSIS — I739 Peripheral vascular disease, unspecified: Secondary | ICD-10-CM | POA: Diagnosis not present

## 2023-02-13 DIAGNOSIS — R0989 Other specified symptoms and signs involving the circulatory and respiratory systems: Secondary | ICD-10-CM | POA: Diagnosis not present

## 2023-02-13 DIAGNOSIS — I10 Essential (primary) hypertension: Secondary | ICD-10-CM

## 2023-02-13 NOTE — Patient Instructions (Signed)
 Medication Instructions:  No changes *If you need a refill on your cardiac medications before your next appointment, please call your pharmacy*   Lab Work: None ordered If you have labs (blood work) drawn today and your tests are completely normal, you will receive your results only by: MyChart Message (if you have MyChart) OR A paper copy in the mail If you have any lab test that is abnormal or we need to change your treatment, we will call you to review the results.   Testing/Procedures: Your physician has requested that you have a lower extremity arterial duplex. During this test, ultrasound is used to evaluate arterial blood flow in the legs. Allow one hour for this exam. There are no restrictions or special instructions. This will take place at [address], [address].  Please note: We ask at that you not bring children with you during ultrasound (echo/ vascular) testing. Due to room size and safety concerns, children are not allowed in the ultrasound rooms during exams. Our front office staff cannot provide observation of children in our lobby area while testing is being conducted. An adult accompanying a patient to their appointment will only be allowed in the ultrasound room at the discretion of the ultrasound technician under special circumstances. We apologize for any inconvenience.  Your physician has requested that you have an ankle brachial index (ABI). During this test an ultrasound and blood pressure cuff are used to evaluate the arteries that supply the arms and legs with blood. Allow thirty minutes for this exam. There are no restrictions or special instructions. This will take place at [address], [address].    Follow-Up: At [facility], you and your health needs are our priority.  As part of our continuing mission to provide you with exceptional heart care, we have created designated Provider Care Teams.  These Care Teams include your primary  Cardiologist (physician) and Advanced Practice Providers (APPs -  Physician Assistants and Nurse Practitioners) who all work together to provide you with the care you need, when you need it.  We recommend signing up for the patient portal called MyChart.  Sign up information is provided on this After Visit Summary.  MyChart is used to connect with patients for Virtual Visits (Telemedicine).  Patients are able to view lab/test results, encounter notes, upcoming appointments, etc.  Non-urgent messages can be sent to your provider as well.   To learn more about what you can do with MyChart, go to [email or web address].    Your next appointment:   6 month(s)  Provider:   Deatrice Cage, MD

## 2023-03-06 ENCOUNTER — Ambulatory Visit (HOSPITAL_COMMUNITY)
Admission: RE | Admit: 2023-03-06 | Discharge: 2023-03-06 | Disposition: A | Discharge status: Home / Self Care | Payer: Medicare Other | Source: Ambulatory Visit | Attending: Cardiovascular Disease | Admitting: Cardiovascular Disease

## 2023-03-06 ENCOUNTER — Ambulatory Visit (HOSPITAL_BASED_OUTPATIENT_CLINIC_OR_DEPARTMENT_OTHER)
Admission: RE | Admit: 2023-03-06 | Discharge: 2023-03-06 | Disposition: A | Discharge status: Home / Self Care | Payer: Medicare Other | Source: Ambulatory Visit | Attending: Cardiovascular Disease | Admitting: Cardiovascular Disease

## 2023-03-06 DIAGNOSIS — I739 Peripheral vascular disease, unspecified: Secondary | ICD-10-CM | POA: Diagnosis present

## 2023-03-08 LAB — VAS US ABI WITH/WO TBI
Left ABI: 0.75
Right ABI: 0.77

## 2023-03-23 ENCOUNTER — Encounter: Payer: Self-pay | Admitting: *Deleted

## 2023-03-26 ENCOUNTER — Other Ambulatory Visit: Payer: Self-pay

## 2023-03-26 ENCOUNTER — Telehealth: Payer: Self-pay | Admitting: Cardiovascular Disease

## 2023-03-26 MED ORDER — APIXABAN 5 MG PO TABS: 5.0000 mg | ORAL_TABLET | Freq: Two times a day (BID) | ORAL | 0 refills | Status: DC

## 2023-03-26 NOTE — Telephone Encounter (Signed)
Patient calling the office for samples of medication:   1.  What medication and dosage are you requesting samples for?   apixaban (ELIQUIS) 5 MG TABS tablet    2.  Are you currently out of this medication? Yes - Need enough to last until 2/25 when she is able to pay for refill    *STAT* If patient is at the pharmacy, call can be transferred to refill team.   1. Which medications need to be refilled? (please list name of each medication and dose if known) apixaban (ELIQUIS) 5 MG TABS tablet    2. Would you like to learn more about the convenience, safety, & potential cost savings by using the [facility]? No   3. Are you open to using the [facility] (Type [facility]. ) No   4. Which pharmacy/location (including street and city if local pharmacy) is medication to be sent to? [id number] - [place], [place] - [address]     5. Do they need a 30 day or 90 day supply? 30 day

## 2023-03-26 NOTE — Telephone Encounter (Signed)
Patient identification verified by 2 forms. Shade Flood, RN     Tried calling but no answer. Patient seen recently in January, has f/u recall for and current prescription for eliquis.  Left detailed message informing patient that samples have been left at front desk for her.

## 2023-03-27 NOTE — Telephone Encounter (Signed)
 2nd attempt to call patient, no answer left message requesting a call back.

## 2023-03-28 NOTE — Telephone Encounter (Signed)
 3rd attempt to call patient, no answer, left message requesting a call back Nursing will await for patient to return call

## 2023-04-03 MED ORDER — APIXABAN 5 MG PO TABS: 5.0000 mg | ORAL_TABLET | Freq: Two times a day (BID) | ORAL | 0 refills | Status: DC

## 2023-04-03 NOTE — Addendum Note (Signed)
 Addended by: Jonah Blue on: 04/03/2023 02:04 PM   Modules accepted: Orders

## 2023-04-05 ENCOUNTER — Other Ambulatory Visit: Payer: Self-pay

## 2023-04-05 MED ORDER — APIXABAN 5 MG PO TABS: 5.0000 mg | ORAL_TABLET | Freq: Two times a day (BID) | ORAL | 5 refills | Status: DC

## 2023-04-05 NOTE — Telephone Encounter (Signed)
 Re[facility].  Pt last saw Dr Kirke Corin on 02/13/23, last labs 01/12/23 Creat 2.40, age 78, weight 89.3kg, based on specified criteria pt is on appropriate dosage of Eliquis 5mg  BID for afib.  Will refill rx.

## 2023-04-06 ENCOUNTER — Telehealth: Payer: Self-pay | Admitting: Cardiovascular Disease

## 2023-04-06 NOTE — Telephone Encounter (Signed)
 Pt c/o medication issue:  1. Name of Medication: apixaban (ELIQUIS) 5 MG TABS tablet   2. How are you currently taking this medication (dosage and times per day)? As written  3. Are you having a reaction (difficulty breathing--STAT)? no  4. What is your medication issue? Pt cannot afford medication and needs to be put on something else

## 2023-04-09 NOTE — Telephone Encounter (Signed)
Called patient left PharmD's advice on personal voice mail. 

## 2023-04-09 NOTE — Telephone Encounter (Signed)
Message sent to Pharm D. 

## 2023-04-09 NOTE — Telephone Encounter (Signed)
 Recommend she contact her insurance company to see if she can sign up for Medicare payment plan

## 2023-04-09 NOTE — Telephone Encounter (Signed)
 Patient is following up. She states she only has enough medication to last until Wednesday and an additional supply will cost her $302. Please advise.

## 2023-04-13 ENCOUNTER — Other Ambulatory Visit (HOSPITAL_COMMUNITY): Payer: Self-pay | Admitting: *Deleted

## 2023-04-16 ENCOUNTER — Inpatient Hospital Stay (HOSPITAL_COMMUNITY): Admission: RE | Admit: 2023-04-16 | Source: Ambulatory Visit

## 2023-04-17 ENCOUNTER — Encounter (HOSPITAL_COMMUNITY)
Admission: RE | Admit: 2023-04-17 | Discharge: 2023-04-17 | Disposition: A | Discharge status: Home / Self Care | Source: Ambulatory Visit | Attending: Nephrology | Admitting: Nephrology

## 2023-04-17 DIAGNOSIS — D631 Anemia in chronic kidney disease: Secondary | ICD-10-CM | POA: Diagnosis not present

## 2023-04-17 DIAGNOSIS — N189 Chronic kidney disease, unspecified: Secondary | ICD-10-CM | POA: Insufficient documentation

## 2023-04-17 MED ORDER — SODIUM CHLORIDE 0.9 % IV SOLN
510.0000 mg | INTRAVENOUS | Status: DC
  Administered 2023-04-17: 510 mg via INTRAVENOUS
  Filled 2023-04-17: qty 510

## 2023-04-19 ENCOUNTER — Other Ambulatory Visit: Payer: Medicare Other

## 2023-04-19 ENCOUNTER — Other Ambulatory Visit: Payer: Self-pay | Admitting: Family Medicine

## 2023-04-19 ENCOUNTER — Encounter: Payer: Self-pay | Admitting: Family Medicine

## 2023-04-19 DIAGNOSIS — M064 Inflammatory polyarthropathy: Secondary | ICD-10-CM

## 2023-04-23 ENCOUNTER — Encounter (HOSPITAL_COMMUNITY)

## 2023-04-24 ENCOUNTER — Encounter (HOSPITAL_COMMUNITY)
Admission: RE | Admit: 2023-04-24 | Discharge: 2023-04-24 | Disposition: A | Discharge status: Home / Self Care | Source: Ambulatory Visit | Attending: Nephrology

## 2023-04-24 ENCOUNTER — Telehealth: Payer: Self-pay | Admitting: Cardiovascular Disease

## 2023-04-24 DIAGNOSIS — N189 Chronic kidney disease, unspecified: Secondary | ICD-10-CM | POA: Diagnosis not present

## 2023-04-24 MED ORDER — SODIUM CHLORIDE 0.9 % IV SOLN
510.0000 mg | INTRAVENOUS | Status: AC
  Administered 2023-04-24: 510 mg via INTRAVENOUS
  Filled 2023-04-24: qty 510

## 2023-04-24 MED ORDER — METOPROLOL SUCCINATE ER 25 MG PO TB24: 12.5000 mg | ORAL_TABLET | Freq: Every day | ORAL | 3 refills | Status: AC

## 2023-04-24 NOTE — Telephone Encounter (Signed)
 Pt's medication was sent to pt's pharmacy as requested. Confirmation received.

## 2023-04-24 NOTE — Telephone Encounter (Signed)
*  STAT* If patient is at the pharmacy, call can be transferred to refill team.   1. Which medications need to be refilled? (please list name of each medication and dose if known) metoprolol succinate (TOPROL-XL) 25 MG 24 hr tablet   2. Which pharmacy/location (including street and city if local pharmacy) is medication to be sent to? [id number] [place] - [address] Phone: [phone]  Fax: [phone]     3. Do they need a 30 day or 90 day supply? 90

## 2023-05-24 ENCOUNTER — Ambulatory Visit: Admitting: Podiatry

## 2023-05-24 ENCOUNTER — Encounter: Payer: Self-pay | Admitting: Podiatry

## 2023-05-24 DIAGNOSIS — I739 Peripheral vascular disease, unspecified: Secondary | ICD-10-CM

## 2023-05-24 DIAGNOSIS — L609 Nail disorder, unspecified: Secondary | ICD-10-CM

## 2023-05-24 DIAGNOSIS — M79675 Pain in left toe(s): Secondary | ICD-10-CM

## 2023-05-24 DIAGNOSIS — B351 Tinea unguium: Secondary | ICD-10-CM | POA: Diagnosis not present

## 2023-05-24 DIAGNOSIS — E119 Type 2 diabetes mellitus without complications: Secondary | ICD-10-CM | POA: Diagnosis not present

## 2023-05-24 DIAGNOSIS — M79674 Pain in right toe(s): Secondary | ICD-10-CM | POA: Diagnosis not present

## 2023-05-24 NOTE — Progress Notes (Signed)
 This patient presents to the office with chief complaint of long thick painful nail left big toe.  She has also developed a nail growth on the outside second toenail right foot.  These are painful when she walks.  She has history of diabetes, coagulation defect and  PVD.  She presents to the office for evaluation and treatment.    General Appearance  Alert, conversant and in no acute stress.  Vascular  Dorsalis pedis and posterior tibial  pulses are  weakly palpable  bilaterally.  Capillary return is within normal limits  bilaterally. Temperature is within normal limits  bilaterally.  Neurologic  Senn-Weinstein monofilament wire test diminished  bilaterally. Muscle power within normal limits bilaterally.  Nails Thick disfigured discolored nails with subungual debris  from hallux to fifth toes bilaterally. No evidence of bacterial infection or drainage bilaterally.  Orthopedic  No limitations of motion  feet .  No crepitus or effusions noted.  No bony pathology or digital deformities noted.  Skin  normotropic skin with no porokeratosis noted bilaterally.  No signs of infections or ulcers noted.     Onychomycosis  B/L.  Nail Spicule second toe right foot.  IE.  Debride nails with nail nipper followed by dremel tool usage.  Nail spicule was removed and the site of nail spicule was cauterized.  RTC 3 months.  Patient says she is interested in permanent correction in the fall of this year.  Ruffin Cotton DPM

## 2023-06-19 ENCOUNTER — Ambulatory Visit: Admitting: Cardiovascular Disease

## 2023-07-10 ENCOUNTER — Ambulatory Visit: Attending: Cardiovascular Disease | Admitting: Cardiovascular Disease

## 2023-07-10 ENCOUNTER — Encounter: Payer: Self-pay | Admitting: Cardiovascular Disease

## 2023-07-10 VITALS — BP 160/80 | HR 56 | Ht 71.0 in | Wt 208.0 lb

## 2023-07-10 DIAGNOSIS — I10 Essential (primary) hypertension: Secondary | ICD-10-CM

## 2023-07-10 DIAGNOSIS — E785 Hyperlipidemia, unspecified: Secondary | ICD-10-CM

## 2023-07-10 DIAGNOSIS — I739 Peripheral vascular disease, unspecified: Secondary | ICD-10-CM

## 2023-07-10 DIAGNOSIS — I480 Paroxysmal atrial fibrillation: Secondary | ICD-10-CM | POA: Diagnosis not present

## 2023-07-10 NOTE — Progress Notes (Signed)
 Cardiology Office Note   Date:  07/10/2023   ID:  Brittany Ford, DOB 1945-10-08, MRN [id number]  PCP:  Brittany Roles, MD  Cardiologist:  Dr. Alvenia Aus  No chief complaint on file.     History of Present Illness: Brittany Ford is a 78 y.o. female who is here today for follow-up visit regarding peripheral arterial disease.    She has history of paroxysmal atrial fibrillation on flecainide , type 2 diabetes, hyperlipidemia with poor tolerance to statins, chronic back pain, GERD and obesity.  She is not a smoker. She was seen in 2022 for atypical left leg claudication but her symptoms were felt to be multifactorial overall with contribution from her spine disease and as well arthritis.  She underwent lower extremity arterial Doppler in June of 2022 which showed an ABI of 0.84 on the right and 0.65 on the left.  Duplex showed moderate SFA disease on the right side and significant proximal/mid SFA disease on the left.  She was initially treated medically for claudication but she returned with worsening left leg symptoms.  Angiography was performed in February 2023 which showed medium size abdominal aortic aneurysm above the iliac bifurcation, mild to moderate iliac disease bilaterally and severe diffuse SFA disease with one-vessel runoff below the knee via the peroneal artery.  I performed successful directional atherectomy and drug-coated balloon angioplasty to the whole length of the left SFA.  Postprocedure ABI improved to 0.97 on the left with patent left SFA.  She was evaluated in 2024 for recurrent falls and dizziness.  She had outpatient monitor done which overall showed no significant arrhythmia to explain loss of consciousness.  Carotid Doppler showed mild bilateral nonobstructive disease.  She was hospitalized in August, 2024 with hyperkalemia and near syncope.  She was orthostatic.  Echocardiogram showed normal LV systolic function.  She improved with hydration.   Lisinopril  and hydrochlorothiazide  were initially discontinued but then resumed at a lower dose.    She was recently seen by me in January.  She complained of increased numbness in the left toes.  Doppler studies showed an ABI of 0.77 on the right and 0.75 on the left.  Duplex on the left showed moderate common femoral artery stenosis and moderate proximal SFA disease.  She had issues with anemia and received IV iron.  She has been doing reasonably well and denies leg claudication.  She does report that her left foot is colder than the right.  She also has numbness likely due to neuropathy.  No chest pain or worsening dyspnea.  No significant palpitations.  Past Medical History:  Diagnosis Date   Arthritis    Atrial fibrillation (HCC)    Atrial flutter (HCC)    Chest pain    with typical and atypical qualities   Chronic low back pain    Diabetes mellitus without complication (HCC)    Diet Controlled   Dyspnea    with exertion (walking long distances)   Dysrhythmia    ATRIAL FIBRILATION   GERD (gastroesophageal reflux disease)    History of hiatal hernia    History of kidney stones    Hypertension    Obesity    Peripheral arterial disease (HCC)    Renal cyst, acquired, left    Renal cyst, acquired, right     Past Surgical History:  Procedure Laterality Date   ABDOMINAL AORTOGRAM W/LOWER EXTREMITY Left 03/23/2021   Procedure: ABDOMINAL AORTOGRAM W/LOWER EXTREMITY;  Surgeon: Brittany Hamilton, MD;  Location: [facility];  Service: Cardiovascular;  Laterality: Left;   BREAST EXCISIONAL BIOPSY Right 1990s   benign   BREAST EXCISIONAL BIOPSY Left 1990s   benign   CHOLECYSTECTOMY N/A 01/11/2023   Procedure: LAPAROSCOPIC CHOLECYSTECTOMY WITH INTRAOPERATIVE CHOLANGIOGRAM;  Surgeon: Brittany Ebbing, MD;  Location: [facility] OR;  Service: General;  Laterality: N/A;   COLONOSCOPY  03/01/2012   Procedure: COLONOSCOPY;  Surgeon: Brittany Boon, MD;  Location: [facility] ENDOSCOPY;  Service: Endoscopy;   Laterality: N/A;   ESOPHAGOGASTRODUODENOSCOPY  02/29/2012   Procedure: ESOPHAGOGASTRODUODENOSCOPY (EGD);  Surgeon: Brittany Boon, MD;  Location: [facility] ENDOSCOPY;  Service: Endoscopy;  Laterality: N/A;   FOOT SURGERY Bilateral    x2   HYSTEROSCOPY WITH D & C N/A 02/09/2017   Procedure: DILATATION AND CURETTAGE /HYSTEROSCOPY;  Surgeon: Brittany Lace, MD;  Location: [facility] ORS;  Service: Gynecology;  Laterality: N/A;  w/Myosure for Polyp   PERIPHERAL VASCULAR ATHERECTOMY Left 03/23/2021   Procedure: PERIPHERAL VASCULAR ATHERECTOMY;  Surgeon: Brittany Hamilton, MD;  Location: [facility];  Service: Cardiovascular;  Laterality: Left;  SFA   TONSILLECTOMY       Current Outpatient Medications  Medication Sig Dispense Refill   amLODipine  (NORVASC ) 2.5 MG tablet Take 1 tablet (2.5 mg total) by mouth daily. 30 tablet 3   apixaban  (ELIQUIS ) 5 MG TABS tablet Take 1 tablet (5 mg total) by mouth 2 (two) times daily. 60 tablet 5   flecainide  (TAMBOCOR ) 50 MG tablet TAKE 1 TABLET BY MOUTH TWICE A DAY 180 tablet 3   gabapentin  (NEURONTIN ) 300 MG capsule Take 600 mg by mouth 2 (two) times daily.     lisinopril -hydrochlorothiazide  (ZESTORETIC ) 10-12.5 MG tablet Take 0.5 tablets by mouth daily.     Multiple Vitamin (MULTIVITAMIN WITH MINERALS) TABS tablet Take 1 tablet by mouth in the morning.     Accu-Chek FastClix Lancets MISC See admin instructions. (Patient not taking: Reported on 07/10/2023)     ACCU-CHEK GUIDE test strip  (Patient not taking: Reported on 07/10/2023)     acetaminophen  (TYLENOL ) 650 MG CR tablet Take 650-1,300 mg by mouth every 8 (eight) hours as needed for pain. (Patient not taking: Reported on 07/10/2023)     Blood Glucose Monitoring Suppl (ACCU-CHEK AVIVA) device by Other route. Use as instructed (Patient not taking: Reported on 07/10/2023)     Cholecalciferol  (VITAMIN D) 2000 units CAPS Take 4,000 Units by mouth daily. (Patient not taking: Reported on 07/10/2023)     ferrous sulfate 325 (65 FE) MG  tablet Take 325 mg by mouth 2 (two) times a week. (Patient not taking: Reported on 07/10/2023)     glipiZIDE  (GLUCOTROL ) 5 MG tablet TAKE 0.5 TABLETS (2.5 MG TOTAL) BY MOUTH DAILY BEFORE BREAKFAST. (Patient not taking: Reported on 07/10/2023) 30 tablet 0   glucose blood test strip Use to check blood sugars (Patient not taking: Reported on 07/10/2023)     metoprolol  succinate (TOPROL -XL) 25 MG 24 hr tablet Take 0.5 tablets (12.5 mg total) by mouth daily. (Patient not taking: Reported on 07/10/2023) 45 tablet 3   oxyCODONE  (OXY IR/ROXICODONE ) 5 MG immediate release tablet Take 1 tablet (5 mg total) by mouth every 6 (six) hours as needed for moderate pain (pain score 4-6). (Patient not taking: Reported on 07/10/2023) 30 tablet 0   traMADol  (ULTRAM ) 50 MG tablet Take 150 mg by mouth every 6 (six) hours. (Patient not taking: Reported on 07/10/2023)     No current facility-administered medications for this visit.    Allergies:   Bee venom, Haemophilus  b polysaccharide vaccine, Hydrocodone  bit-homatrop mbr, Influenza virus vaccine, Atorvastatin , Rivaroxaban, Ace inhibitors, and Crestor [rosuvastatin]    Social History:  The patient  reports that she quit smoking about 17 years ago. Her smoking use included cigarettes. She started smoking about 37 years ago. She has a 10 pack-year smoking history. She has never used smokeless tobacco. She reports that she does not drink alcohol and does not use drugs.   Family History:  The patient's family history includes Breast cancer in her mother; COPD in her father; Heart attack in her brother and father; Heart disease in her sister; Lung cancer in her father.    ROS:  Please see the history of present illness.   Otherwise, review of systems are positive for none.   All other systems are reviewed and negative.    PHYSICAL EXAM: VS:  BP (!) 160/80 (BP Location: Left Arm, Patient Position: Sitting)   Pulse (!) 56   Ht 5\' 11"  (1.803 m)   Wt 208 lb (94.3 kg)   SpO2 99%    BMI 29.01 kg/m  , BMI Body mass index is 29.01 kg/m. GEN: Well nourished, well developed, in no acute distress  HEENT: normal  Neck: no JVD, or masses.  Bilateral carotid bruit louder on the left side. Cardiac: RRR; no murmurs, rubs, or gallops,no edema  Respiratory:  clear to auscultation bilaterally, normal work of breathing GI: soft, nontender, nondistended, + BS MS: no deformity or atrophy  Skin: warm and dry, no rash Neuro:  Strength and sensation are intact Psych: euthymic mood, full affect    EKG:  EKG is  ordered today. EKG showed: Sinus bradycardia with marked sinus arrhythmia with 1st degree A-V block When compared with ECG of 05-Dec-2022 10:41, Nonspecific T wave abnormality no longer evident in Inferior leads Nonspecific T wave abnormality, improved in Lateral leads      Recent Labs: 01/12/2023: ALT 49; BUN 33; Creatinine, Ser 2.40; Hemoglobin 7.3; Platelets 252; Potassium 5.1; Sodium 131    Lipid Panel    Component Value Date/Time   CHOL 210 (H) 04/22/2013 2306   TRIG 200 (H) 04/22/2013 2306   HDL 32 (L) 04/22/2013 2306   CHOLHDL 6.6 04/22/2013 2306   VLDL 40 04/22/2013 2306   LDLCALC 138 (H) 04/22/2013 2306      Wt Readings from Last 3 Encounters:  07/10/23 208 lb (94.3 kg)  04/17/23 202 lb (91.6 kg)  02/13/23 196 lb 12.8 oz (89.3 kg)            No data to display            ASSESSMENT AND PLAN:  1.  Peripheral arterial disease: Status post endovascular intervention on the left SFA.  She has moderate stenosis in the left common femoral artery and left SFA.  Recommend continuing medical therapy.  Most of her symptoms seem to be due to neuropathy at this point.  2.  Paroxysmal atrial fibrillation: She is on long-term anticoagulation with Eliquis .  The dose will be decreased to 2.5 mg twice daily once she is 81.  We did discuss Watchman device at some point but she was not interested due to having too many medical issues. She is on long-term  flecainide  50 mg twice daily which should not be increased given underlying chronic kidney disease.  3.  Hyperlipidemia: Her LDL is usually close to 150 and she reports intolerance to rosuvastatin and atorvastatin .  She refuses any further treatment of hyperlipidemia.  4.  Essential hypertension:  Blood pressure is elevated today.  She has history of orthostatic hypotension and recurrent falls.  5.  Chronic kidney disease: She follows with nephrology.  6.  Anemia: Most recent hemoglobin improved to 8.5.    Disposition:   Follow-up with me in 6 months.  Signed,  Antionette Kirks, MD  07/10/2023 8:23 AM    [facility]

## 2023-07-10 NOTE — Patient Instructions (Signed)
 Medication Instructions:  No changes *If you need a refill on your cardiac medications before your next appointment, please call your pharmacy*  Lab Work: None ordered If you have labs (blood work) drawn today and your tests are completely normal, you will receive your results only by: MyChart Message (if you have MyChart) OR A paper copy in the mail If you have any lab test that is abnormal or we need to change your treatment, we will call you to review the results.  Testing/Procedures: None ordered  Follow-Up: At [facility], you and your health needs are our priority.  As part of our continuing mission to provide you with exceptional heart care, our providers are all part of one team.  This team includes your primary Cardiologist (physician) and Advanced Practice Providers or APPs (Physician Assistants and Nurse Practitioners) who all work together to provide you with the care you need, when you need it.  Your next appointment:   6 month(s)  Provider:   Antionette Kirks, MD    We recommend signing up for the patient portal called "MyChart".  Sign up information is provided on this After Visit Summary.  MyChart is used to connect with patients for Virtual Visits (Telemedicine).  Patients are able to view lab/test results, encounter notes, upcoming appointments, etc.  Non-urgent messages can be sent to your provider as well.   To learn more about what you can do with MyChart, go to [email or web address].

## 2023-08-20 ENCOUNTER — Ambulatory Visit: Admitting: Podiatry

## 2023-09-04 ENCOUNTER — Telehealth: Payer: Self-pay | Admitting: Cardiovascular Disease

## 2023-09-04 NOTE — Telephone Encounter (Signed)
 Spoke to patient . Offered patient an earlier appt with APP . Patient decline and states she would like to wait for appointment with Dr Darron.

## 2023-09-04 NOTE — Telephone Encounter (Signed)
 Patient calling in about blockage in both of her legs, she stated that it more sore in the left leg. Wanted me to schedule her with Dr. Darron and dr darron only. Schedule for 8/26. Please advise

## 2023-09-11 IMAGING — MR MR ANKLE*L* W/O CM
4 of 5 series · 12 of 40 positions shown · non-contrast
Comparison: None.

CLINICAL DATA: Left ankle sprain 1 month ago.  Status post fall.

EXAM:
MRI OF THE LEFT ANKLE WITHOUT CONTRAST
TECHNIQUE: Multiplanar, multisequence MR imaging of the ankle was performed. No
intravenous contrast was administered.

[Series 3: PD fat-sat · axial · left · 3.0mm · 0.25mm/px · z∈[-36,+51]mm · 3 of 30 slices shown]
[im 4/30]
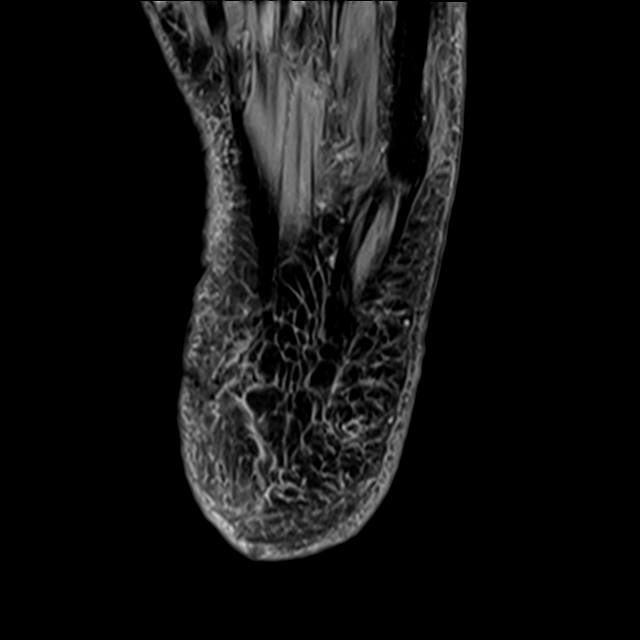
[im 15/30]
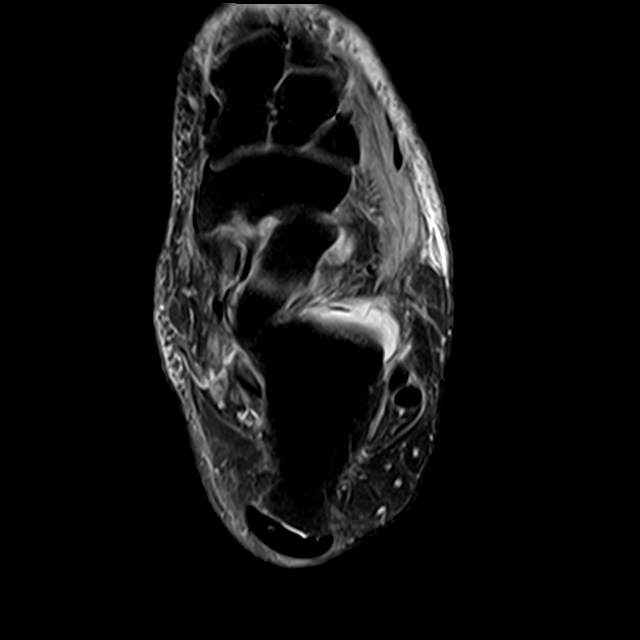
[im 26/30]
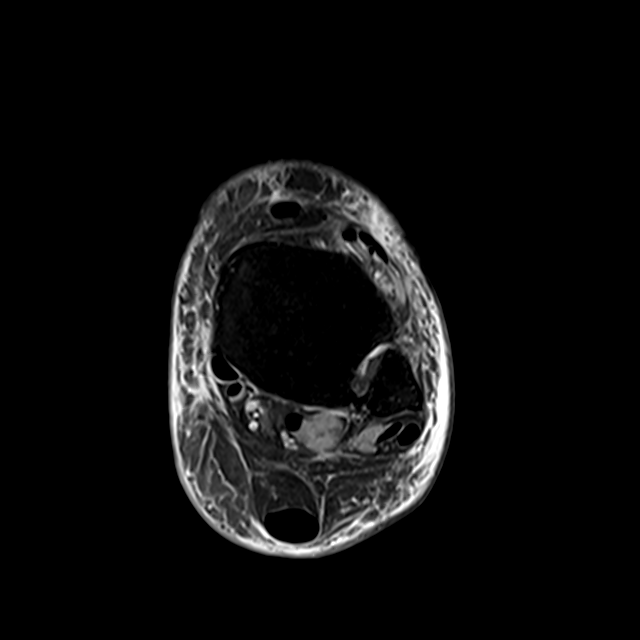

[Series 4: T2 fat-sat · axial · left · 3.0mm · 0.25mm/px · z∈[-36,+51]mm · 3 of 30 slices shown (1 of 2)]
[im 4/30]
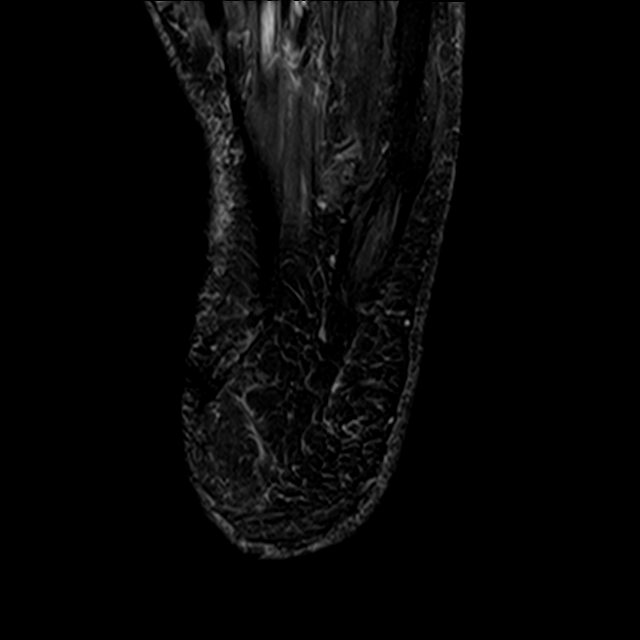
[im 15/30]
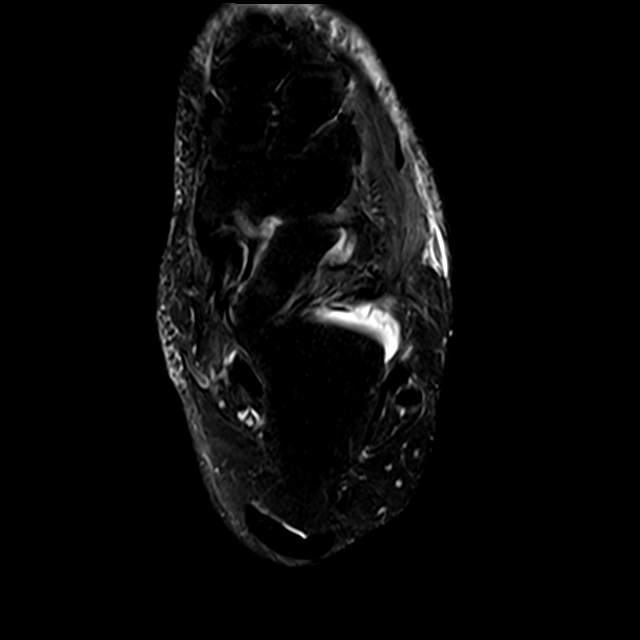
[im 26/30]
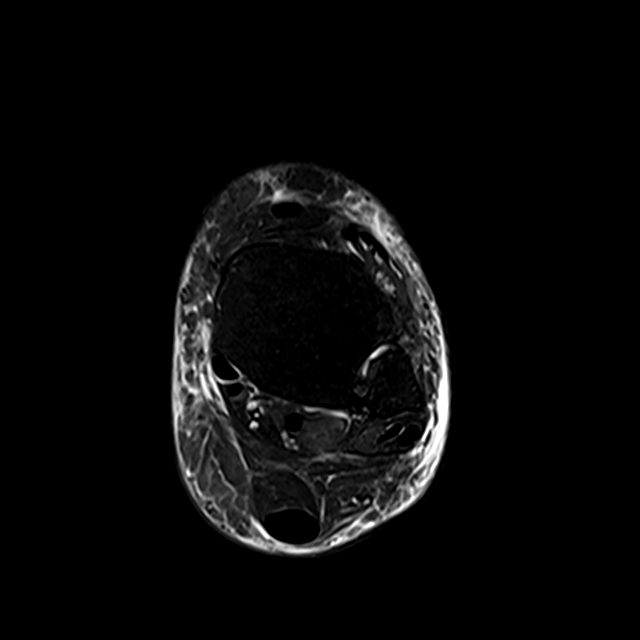

[Series 5: T1 · sagittal · left · 4.0mm · 0.27mm/px · 3 of 22 slices shown]
[im 5/22]
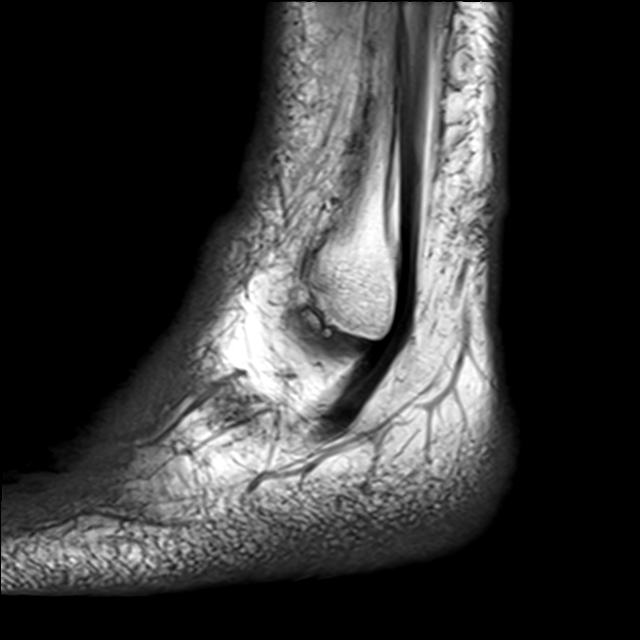
[im 13/22]
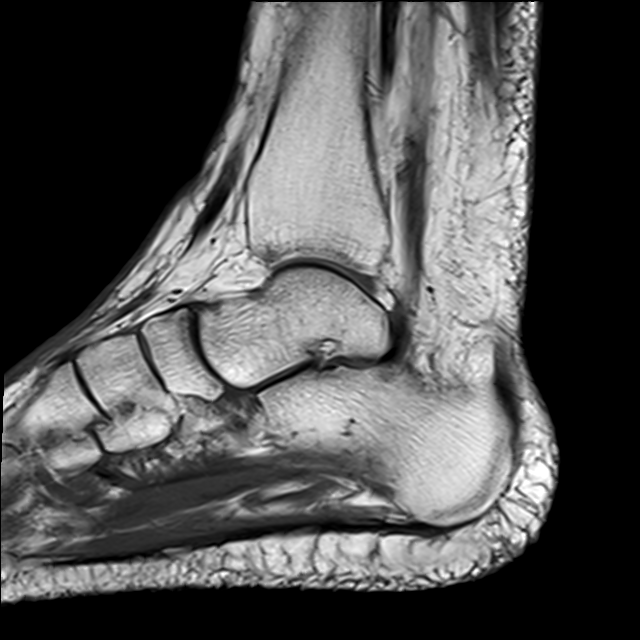
[im 22/22]
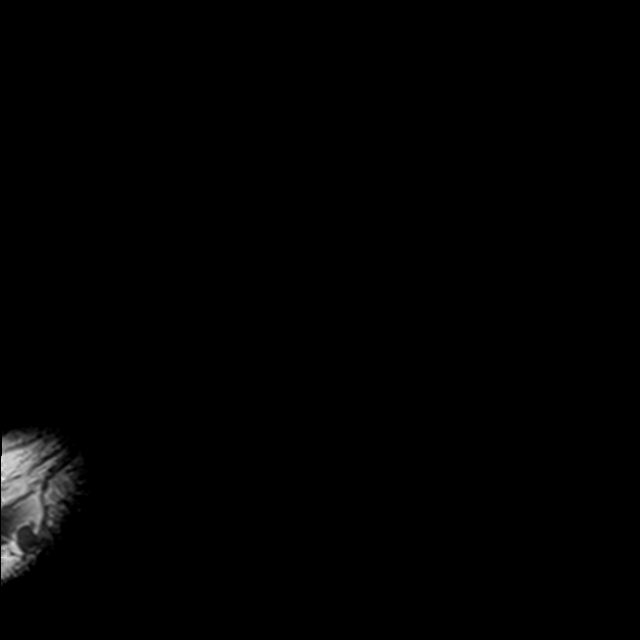

[Series 7: T2 fat-sat · coronal · left · 3.0mm · 0.25mm/px · 3 of 33 slices shown (2 of 2)]
[im 4/33]
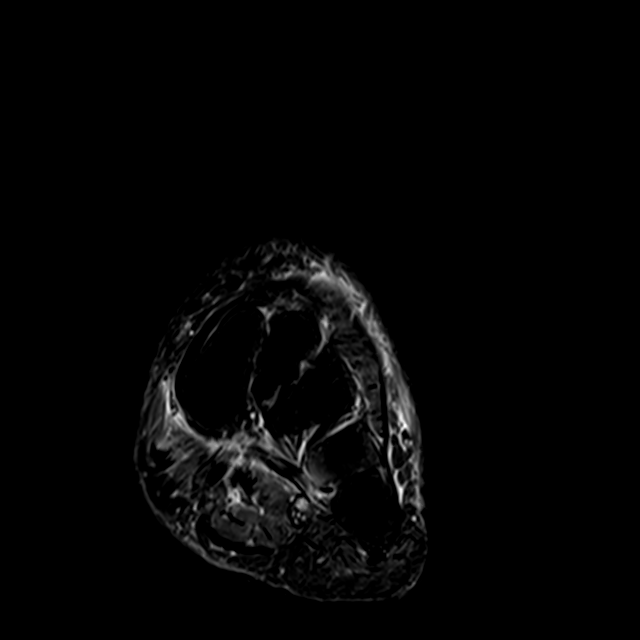
[im 18/33]
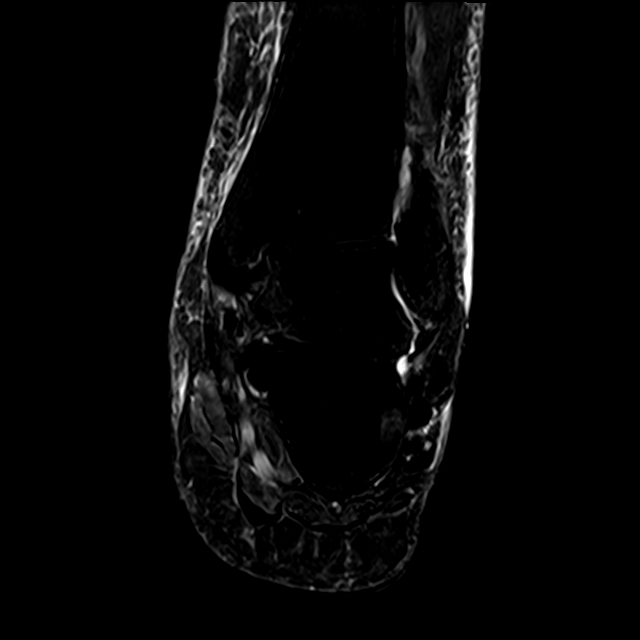
[im 29/33]
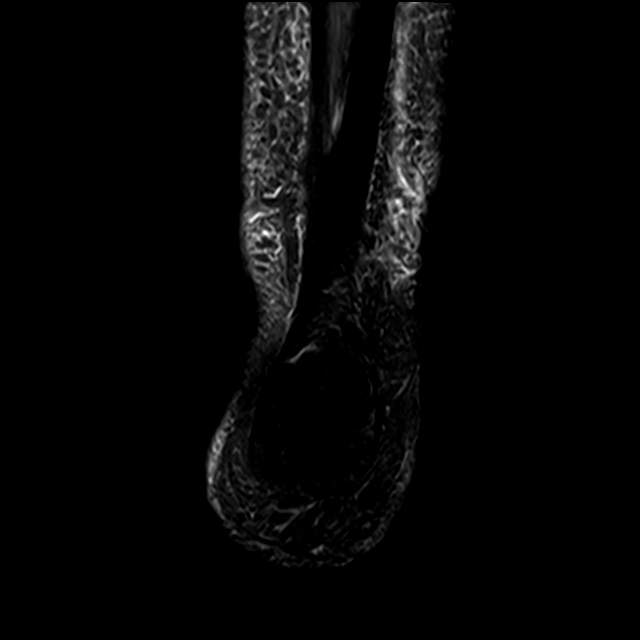

[12 of 40 positions shown; findings below may reference images not displayed]

FINDINGS: TENDONS

Peroneal: Mild tendinosis of the peroneus longus and peroneus brevis
tendons.

Posteromedial: Mild tendinosis of the posterior tibial tendon.
Flexor hallucis longus tendon intact. Flexor digitorum longus tendon
intact.

Anterior: Tibialis anterior tendon intact. Extensor hallucis longus
tendon intact Extensor digitorum longus tendon intact.

Achilles: Mild tendinosis of the distal Achilles tendon just
proximal to the calcaneal insertion. Trace amount of fluid in the
retrocalcaneal bursa.

Plantar Fascia: Intact.

LIGAMENTS

Lateral: Complete tear of the anterior talofibular ligament. Mild
increased signal in the posterior talofibular ligament likely
reflecting mild ligament strain. Calcaneofibular ligament intact.
Anterior and posterior tibiofibular ligaments intact.

Medial: Deltoid ligament intact. Spring ligament intact.

CARTILAGE

Ankle Joint: No joint effusion. Normal ankle mortise. No chondral
defect.

Subtalar Joints/Sinus Tarsi: Normal sinus tarsi. Mild osteoarthritis
of the posterior subtalar joint. Small posterior subtalar joint
effusion.

Bones: No aggressive osseous lesion. No fracture or dislocation.
Mild osteoarthritis of the calcaneocuboid joint. Mild osseous
contusion of the tip of the medial malleolus and adjacent medial
talus. Mild subcortical bone marrow edema in the lateral calcaneus
adjacent to the peroneal tendons.

Soft Tissue: No fluid collection or hematoma. Mild muscle edema in
the abductor hallucis muscle consistent with muscle strain. Tarsal
tunnel is normal.
IMPRESSION: 1. Complete tear of the anterior talofibular ligament. Mild
increased signal in the posterior talofibular ligament likely
reflecting mild ligament strain. Mild osseous contusion of the tip
of the medial malleolus and adjacent medial talus. Overall findings
are consistent with an inversion injury.
2. Mild tendinosis of the peroneus longus and peroneus brevis
tendons.
3. Mild tendinosis of the distal Achilles tendon just proximal to
the calcaneal insertion.
4. Mild tendinosis of the posterior tibial tendon.
5. Mild muscle strain of the abductor hallucis.

## 2023-09-25 ENCOUNTER — Ambulatory Visit: Admitting: Podiatry

## 2023-10-01 NOTE — Progress Notes (Unsigned)
 Cardiology Office Note   Date:  10/02/2023   ID:  Brittany Ford, DOB 04-20-1945, MRN [id number]  PCP:  Haze Kingfisher, MD  Cardiologist:  Dr. Darron  No chief complaint on file.     History of Present Illness: Brittany Ford is a 78 y.o. female who is here today for follow-up visit regarding peripheral arterial disease.    She has history of paroxysmal atrial fibrillation, type 2 diabetes, hyperlipidemia with poor tolerance to statins, chronic back pain, GERD and obesity.  She is not a smoker. She was seen in 2022 for atypical left leg claudication but her symptoms were felt to be multifactorial overall with contribution from her spine disease and as well arthritis.  She underwent lower extremity arterial Doppler in June of 2022 which showed an ABI of 0.84 on the right and 0.65 on the left.  Duplex showed moderate SFA disease on the right side and significant proximal/mid SFA disease on the left.  She was initially treated medically for claudication but she returned with worsening left leg symptoms.  Angiography was performed in February 2023 which showed medium size abdominal aortic aneurysm above the iliac bifurcation, mild to moderate iliac disease bilaterally and severe diffuse SFA disease with one-vessel runoff below the knee via the peroneal artery.  I performed successful directional atherectomy and drug-coated balloon angioplasty to the whole length of the left SFA.    She was evaluated in 2024 for recurrent falls and dizziness.  She had outpatient monitor done which overall showed no significant arrhythmia to explain loss of consciousness.  Carotid Doppler showed mild bilateral nonobstructive disease.  She was hospitalized in August, 2024 with hyperkalemia and near syncope.  She was orthostatic.  Echocardiogram showed normal LV systolic function.  She improved with hydration.  Lisinopril  and hydrochlorothiazide  were initially discontinued but then resumed at a lower  dose.    She was recently seen by me in January.  She complained of increased numbness in the left toes.  Doppler studies showed an ABI of 0.77 on the right and 0.75 on the left.  Duplex on the left showed moderate common femoral artery stenosis and moderate proximal SFA disease.  She had issues with anemia and received IV iron.  She has underlying chronic kidney disease and follows with nephrology.  Recent renal artery duplex showed no evidence of renal artery stenosis.  She complains of bilateral lower extremity swelling worse on the left side with numbness and cold sensation in her toes especially on the left.  No significant calf claudication.  She also reports worsening shortness of breath and weight gain.  She eats out frequently.  Past Medical History:  Diagnosis Date   Arthritis    Atrial fibrillation (HCC)    Atrial flutter (HCC)    Chest pain    with typical and atypical qualities   Chronic low back pain    Diabetes mellitus without complication (HCC)    Diet Controlled   Dyspnea    with exertion (walking long distances)   Dysrhythmia    ATRIAL FIBRILATION   GERD (gastroesophageal reflux disease)    History of hiatal hernia    History of kidney stones    Hypertension    Obesity    Peripheral arterial disease (HCC)    Renal cyst, acquired, left    Renal cyst, acquired, right     Past Surgical History:  Procedure Laterality Date   ABDOMINAL AORTOGRAM W/LOWER EXTREMITY Left 03/23/2021   Procedure: ABDOMINAL AORTOGRAM W/LOWER EXTREMITY;  Surgeon: Darron Deatrice LABOR, MD;  Location: [facility] INVASIVE CV LAB;  Service: Cardiovascular;  Laterality: Left;   BREAST EXCISIONAL BIOPSY Right 1990s   benign   BREAST EXCISIONAL BIOPSY Left 1990s   benign   CHOLECYSTECTOMY N/A 01/11/2023   Procedure: LAPAROSCOPIC CHOLECYSTECTOMY WITH INTRAOPERATIVE CHOLANGIOGRAM;  Surgeon: Belinda Cough, MD;  Location: [facility] OR;  Service: General;  Laterality: N/A;   COLONOSCOPY  03/01/2012   Procedure:  COLONOSCOPY;  Surgeon: Norleen JAYSON Hint, MD;  Location: [facility] ENDOSCOPY;  Service: Endoscopy;  Laterality: N/A;   ESOPHAGOGASTRODUODENOSCOPY  02/29/2012   Procedure: ESOPHAGOGASTRODUODENOSCOPY (EGD);  Surgeon: Norleen JAYSON Hint, MD;  Location: [facility] ENDOSCOPY;  Service: Endoscopy;  Laterality: N/A;   FOOT SURGERY Bilateral    x2   HYSTEROSCOPY WITH D & C N/A 02/09/2017   Procedure: DILATATION AND CURETTAGE /HYSTEROSCOPY;  Surgeon: Rosalva Sawyer, MD;  Location: [facility] ORS;  Service: Gynecology;  Laterality: N/A;  w/Myosure for Polyp   PERIPHERAL VASCULAR ATHERECTOMY Left 03/23/2021   Procedure: PERIPHERAL VASCULAR ATHERECTOMY;  Surgeon: Darron Deatrice LABOR, MD;  Location: [facility] INVASIVE CV LAB;  Service: Cardiovascular;  Laterality: Left;  SFA   TONSILLECTOMY       Current Outpatient Medications  Medication Sig Dispense Refill   Accu-Chek FastClix Lancets MISC See admin instructions.     ACCU-CHEK GUIDE test strip      acetaminophen  (TYLENOL ) 650 MG CR tablet Take 650-1,300 mg by mouth every 8 (eight) hours as needed for pain.     amLODipine  (NORVASC ) 2.5 MG tablet Take 1 tablet (2.5 mg total) by mouth daily. 30 tablet 3   apixaban  (ELIQUIS ) 5 MG TABS tablet Take 1 tablet (5 mg total) by mouth 2 (two) times daily. 60 tablet 5   Blood Glucose Monitoring Suppl (ACCU-CHEK AVIVA) device by Other route. Use as instructed     Cholecalciferol  (VITAMIN D) 2000 units CAPS Take 4,000 Units by mouth daily.     ferrous sulfate 325 (65 FE) MG tablet Take 325 mg by mouth 2 (two) times a week.     flecainide  (TAMBOCOR ) 50 MG tablet TAKE 1 TABLET BY MOUTH TWICE A DAY 180 tablet 3   gabapentin  (NEURONTIN ) 300 MG capsule Take 600 mg by mouth 2 (two) times daily.     glipiZIDE  (GLUCOTROL ) 5 MG tablet TAKE 0.5 TABLETS (2.5 MG TOTAL) BY MOUTH DAILY BEFORE BREAKFAST. 30 tablet 0   glucose blood test strip Use to check blood sugars     lisinopril -hydrochlorothiazide  (ZESTORETIC ) 10-12.5 MG tablet Take 0.5 tablets by mouth daily.      metoprolol  succinate (TOPROL -XL) 25 MG 24 hr tablet Take 0.5 tablets (12.5 mg total) by mouth daily. 45 tablet 3   Multiple Vitamin (MULTIVITAMIN WITH MINERALS) TABS tablet Take 1 tablet by mouth in the morning.     oxyCODONE  (OXY IR/ROXICODONE ) 5 MG immediate release tablet Take 1 tablet (5 mg total) by mouth every 6 (six) hours as needed for moderate pain (pain score 4-6). 30 tablet 0   traMADol  (ULTRAM ) 50 MG tablet Take 150 mg by mouth every 6 (six) hours.     No current facility-administered medications for this visit.    Allergies:   Bee venom, Haemophilus b polysaccharide vaccine, Hydrocodone  bit-homatrop mbr, Influenza virus vaccine, Atorvastatin , Rivaroxaban, Ace inhibitors, and Crestor [rosuvastatin]    Social History:  The patient  reports that she quit smoking about 18 years ago. Her smoking use included cigarettes. She started smoking about 38 years ago. She has a 10 pack-year smoking history. She  has never used smokeless tobacco. She reports that she does not drink alcohol and does not use drugs.   Family History:  The patient's family history includes Breast cancer in her mother; COPD in her father; Heart attack in her brother and father; Heart disease in her sister; Lung cancer in her father.    ROS:  Please see the history of present illness.   Otherwise, review of systems are positive for none.   All other systems are reviewed and negative.    PHYSICAL EXAM: VS:  BP 134/84   Pulse 69   Ht 5' 11 (1.803 m)   Wt 213 lb 12.8 oz (97 kg)   SpO2 95%   BMI 29.82 kg/m  , BMI Body mass index is 29.82 kg/m. GEN: Well nourished, well developed, in no acute distress  HEENT: normal  Neck: no JVD, or masses.  Bilateral carotid bruit louder on the left side. Cardiac: RRR; no rubs, or gallops, 2 out of 6 holosystolic murmur at the left sternal border.  Mild to moderate bilateral lower extremity edema worse on the left side Respiratory:  clear to auscultation bilaterally, normal work  of breathing GI: soft, nontender, nondistended, + BS MS: no deformity or atrophy  Skin: warm and dry, no rash Neuro:  Strength and sensation are intact Psych: euthymic mood, full affect    EKG:  EKG is not ordered today.    Recent Labs: 01/12/2023: ALT 49; BUN 33; Creatinine, Ser 2.40; Hemoglobin 7.3; Platelets 252; Potassium 5.1; Sodium 131    Lipid Panel    Component Value Date/Time   CHOL 210 (H) 04/22/2013 2306   TRIG 200 (H) 04/22/2013 2306   HDL 32 (L) 04/22/2013 2306   CHOLHDL 6.6 04/22/2013 2306   VLDL 40 04/22/2013 2306   LDLCALC 138 (H) 04/22/2013 2306      Wt Readings from Last 3 Encounters:  10/02/23 213 lb 12.8 oz (97 kg)  07/10/23 208 lb (94.3 kg)  04/17/23 202 lb (91.6 kg)            No data to display            ASSESSMENT AND PLAN:  1.  Peripheral arterial disease: Status post endovascular intervention on the left SFA.  She has moderate stenosis in the left common femoral artery and left SFA.  Her current symptoms are highly suggestive of peripheral neuropathy.  Will plan on repeat Doppler studies in January.  2.  Paroxysmal atrial fibrillation: She is on long-term anticoagulation with Eliquis .  The dose will be decreased to 2.5 mg twice daily once she is 81.  She is maintaining sinus rhythm with flecainide  50 mg twice daily.  The dose should not be increased given underlying chronic kidney disease.  3.  Hyperlipidemia: Her LDL is usually close to 150 and she reports intolerance to rosuvastatin and atorvastatin .  She refuses any further treatment of hyperlipidemia.  4.  Essential hypertension: Blood pressure is well-controlled  5.  Chronic kidney disease: She follows with nephrology.  6.  Anemia: Most recent hemoglobin improved to 8.5.  7.  Lower extremity edema: Suspect multifactorial due to chronic venous insufficiency, amlodipine  and underlying chronic kidney disease.  She does report worsening shortness of breath and weight gain.  Thus,  we will obtain an echocardiogram.    Disposition:   Follow-up with me in 6 months.  Signed,  Deatrice Cage, MD  10/02/2023 8:28 AM    [facility]

## 2023-10-02 ENCOUNTER — Encounter: Payer: Self-pay | Admitting: Cardiovascular Disease

## 2023-10-02 ENCOUNTER — Ambulatory Visit: Attending: Cardiovascular Disease | Admitting: Cardiovascular Disease

## 2023-10-02 VITALS — BP 134/84 | HR 69 | Ht 71.0 in | Wt 213.8 lb

## 2023-10-02 DIAGNOSIS — E785 Hyperlipidemia, unspecified: Secondary | ICD-10-CM | POA: Diagnosis not present

## 2023-10-02 DIAGNOSIS — I480 Paroxysmal atrial fibrillation: Secondary | ICD-10-CM | POA: Diagnosis not present

## 2023-10-02 DIAGNOSIS — I10 Essential (primary) hypertension: Secondary | ICD-10-CM

## 2023-10-02 DIAGNOSIS — R0602 Shortness of breath: Secondary | ICD-10-CM | POA: Diagnosis not present

## 2023-10-02 DIAGNOSIS — I739 Peripheral vascular disease, unspecified: Secondary | ICD-10-CM | POA: Diagnosis not present

## 2023-10-02 NOTE — Patient Instructions (Addendum)
 Medication Instructions:  No changes *If you need a refill on your cardiac medications before your next appointment, please call your pharmacy*  Lab Work: None ordered If you have labs (blood work) drawn today and your tests are completely normal, you will receive your results only by: MyChart Message (if you have MyChart) OR A paper copy in the mail If you have any lab test that is abnormal or we need to change your treatment, we will call you to review the results.  Testing/Procedures: Your physician has requested that you have an echocardiogram. Echocardiography is a painless test that uses sound waves to create images of your heart. It provides your doctor with information about the size and shape of your heart and how well your heart's chambers and valves are working. You may receive an ultrasound enhancing agent through an IV if needed to better visualize your heart during the echo.This procedure takes approximately one hour. There are no restrictions for this procedure.  This will take place at [address] [facility], 2nd floor  Your physician has requested that you have a lower extremity arterial duplex in January. During this test, ultrasound is used to evaluate arterial blood flow in the legs. Allow one hour for this exam. There are no restrictions or special instructions. This will take place at [address] [facility], 4th floor   Your physician has requested that you have an ankle brachial index (ABI) in January. During this test an ultrasound and blood pressure cuff are used to evaluate the arteries that supply the arms and legs with blood. Allow thirty minutes for this exam. There are no restrictions or special instructions. This will take place at [address] 4th floor   Please note: We ask at that you not bring children with you during ultrasound (echo/ vascular) testing. Due to room size and safety concerns, children are not allowed in the ultrasound rooms during exams. Our front  office staff cannot provide observation of children in our lobby area while testing is being conducted. An adult accompanying a patient to their appointment will only be allowed in the ultrasound room at the discretion of the ultrasound technician under special circumstances. We apologize for any inconvenience.   Follow-Up: At [facility], you and your health needs are our priority.  As part of our continuing mission to provide you with exceptional heart care, our providers are all part of one team.  This team includes your primary Cardiologist (physician) and Advanced Practice Providers or APPs (Physician Assistants and Nurse Practitioners) who all work together to provide you with the care you need, when you need it.  Your next appointment:   6 month(s)  Provider:   Deatrice Cage, MD    We recommend signing up for the patient portal called MyChart.  Sign up information is provided on this After Visit Summary.  MyChart is used to connect with patients for Virtual Visits (Telemedicine).  Patients are able to view lab/test results, encounter notes, upcoming appointments, etc.  Non-urgent messages can be sent to your provider as well.   To learn more about what you can do with MyChart, go to [email or web address].    [facility]:

## 2023-10-03 ENCOUNTER — Other Ambulatory Visit: Payer: Self-pay | Admitting: Cardiovascular Disease

## 2023-10-03 NOTE — Telephone Encounter (Signed)
 Prescription refill request for Eliquis  received. Indication:afib Last office visit:8/25 Scr:2.40  12/24 Age: 78 Weight:97  kg  Prescription refilled

## 2023-10-18 ENCOUNTER — Other Ambulatory Visit: Payer: Self-pay

## 2023-10-18 MED ORDER — FLECAINIDE ACETATE 50 MG PO TABS: 50.0000 mg | ORAL_TABLET | Freq: Two times a day (BID) | ORAL | 1 refills | Status: AC

## 2023-10-22 ENCOUNTER — Other Ambulatory Visit (HOSPITAL_COMMUNITY): Payer: Self-pay | Admitting: Nephrology

## 2023-10-23 ENCOUNTER — Telehealth (HOSPITAL_COMMUNITY): Payer: Self-pay

## 2023-10-23 NOTE — Telephone Encounter (Signed)
 Auth Submission: NO AUTH NEEDED Site of care: Site of care: [facility] Payer: [employer] Medicare Medication & CPT/J Code(s) submitted: Feraheme (ferumoxytol ) [id number] Diagnosis Code: D50.9 Route of submission (phone, fax, portal):  Phone # Fax # Auth type: Buy/Bill HB Units/visits requested: 510mg  x 2 doses Reference number:  Approval from: 10/23/23 to 01/22/24

## 2023-10-26 ENCOUNTER — Other Ambulatory Visit: Payer: Self-pay | Admitting: Family Medicine

## 2023-10-26 DIAGNOSIS — Z1231 Encounter for screening mammogram for malignant neoplasm of breast: Secondary | ICD-10-CM

## 2023-11-01 ENCOUNTER — Ambulatory Visit (HOSPITAL_COMMUNITY)
Admission: RE | Admit: 2023-11-01 | Discharge: 2023-11-01 | Disposition: A | Discharge status: Home / Self Care | Source: Ambulatory Visit | Attending: Cardiology | Admitting: Cardiology

## 2023-11-01 DIAGNOSIS — R0602 Shortness of breath: Secondary | ICD-10-CM | POA: Insufficient documentation

## 2023-11-01 LAB — ECHOCARDIOGRAM COMPLETE: S' Lateral: 2.53 cm

## 2023-11-07 ENCOUNTER — Ambulatory Visit: Payer: Self-pay | Admitting: Cardiovascular Disease

## 2023-11-20 ENCOUNTER — Ambulatory Visit: Admitting: Podiatry

## 2023-11-22 ENCOUNTER — Telehealth: Payer: Self-pay

## 2023-11-22 NOTE — Telephone Encounter (Signed)
 Contact patient to make appointment per vm request.

## 2023-11-22 NOTE — Telephone Encounter (Signed)
 Patient will get referral since her last visit was 05/19/2020 and she is considered a new patient.

## 2023-11-29 ENCOUNTER — Other Ambulatory Visit: Payer: Self-pay | Admitting: Family Medicine

## 2023-11-29 ENCOUNTER — Ambulatory Visit
Admission: RE | Admit: 2023-11-29 | Discharge: 2023-11-29 | Disposition: A | Source: Ambulatory Visit | Attending: Family Medicine

## 2023-11-29 DIAGNOSIS — R1901 Right upper quadrant abdominal swelling, mass and lump: Secondary | ICD-10-CM

## 2023-12-04 ENCOUNTER — Ambulatory Visit (INDEPENDENT_AMBULATORY_CARE_PROVIDER_SITE_OTHER): Admitting: Podiatry

## 2023-12-04 DIAGNOSIS — M79671 Pain in right foot: Secondary | ICD-10-CM | POA: Diagnosis not present

## 2023-12-04 DIAGNOSIS — B351 Tinea unguium: Secondary | ICD-10-CM

## 2023-12-04 DIAGNOSIS — M79672 Pain in left foot: Secondary | ICD-10-CM | POA: Diagnosis not present

## 2023-12-04 NOTE — Progress Notes (Signed)
 Patient presents for evaluation and treatment of tenderness and some redness around nails feet.  Tenderness around toes with walking and wearing shoes.  Complains of tenderness on the hallux nail left especially  Physical exam:  General appearance: Alert, pleasant, and in no acute distress.  Vascular: Pedal pulses: DP 1/4 B/L, PT 1/4 B/L.   Neurologic:  Dermatologic:  Nails thickened, disfigured, discolored 1-5 BL with subungual debris.  Redness and hypertrophic nail folds along nail folds bilaterally but no signs of drainage or infection.  Musculoskeletal:     Diagnosis: 1. Painful onychomycotic nails 1 through 5 bilaterally. 2. Pain toes 1 through 5 bilaterally.  Plan: -Debrided onychomycotic nails 1 through 5 bilaterally.  Sharply debrided nails with nail clipper and reduced with a power bur.  -Given diminished pulses recommended against any permanent removal of the nail on the left hallux.  If it does become more problematic we might need to do vascular testing before doing any procedures. Return 3 months [facility]

## 2023-12-13 ENCOUNTER — Other Ambulatory Visit: Payer: Self-pay | Admitting: Nephrology

## 2023-12-13 DIAGNOSIS — N281 Cyst of kidney, acquired: Secondary | ICD-10-CM

## 2023-12-19 ENCOUNTER — Other Ambulatory Visit

## 2024-01-13 ENCOUNTER — Ambulatory Visit
Admission: RE | Admit: 2024-01-13 | Discharge: 2024-01-13 | Disposition: A | Source: Ambulatory Visit | Attending: Nephrology | Admitting: Nephrology

## 2024-01-13 DIAGNOSIS — N281 Cyst of kidney, acquired: Secondary | ICD-10-CM

## 2024-01-13 MED ORDER — GADOPICLENOL 0.5 MMOL/ML IV SOLN
10.0000 mL | Freq: Once | INTRAVENOUS | Status: AC | PRN
  Administered 2024-01-13: 10 mL via INTRAVENOUS

## 2024-02-01 ENCOUNTER — Encounter (HOSPITAL_COMMUNITY): Payer: Self-pay | Admitting: Nephrology

## 2024-02-08 ENCOUNTER — Ambulatory Visit (HOSPITAL_COMMUNITY)
Admission: RE | Admit: 2024-02-08 | Discharge: 2024-02-08 | Disposition: A | Discharge status: Home / Self Care | Source: Ambulatory Visit | Attending: Cardiovascular Disease | Admitting: Cardiovascular Disease

## 2024-02-08 ENCOUNTER — Ambulatory Visit (HOSPITAL_BASED_OUTPATIENT_CLINIC_OR_DEPARTMENT_OTHER)
Admission: RE | Admit: 2024-02-08 | Discharge: 2024-02-08 | Disposition: A | Discharge status: Home / Self Care | Source: Ambulatory Visit | Attending: Cardiovascular Disease | Admitting: Cardiovascular Disease

## 2024-02-08 DIAGNOSIS — I739 Peripheral vascular disease, unspecified: Secondary | ICD-10-CM

## 2024-02-08 LAB — VAS US ABI WITH/WO TBI
Left ABI: 0.95
Right ABI: 0.83

## 2024-02-11 ENCOUNTER — Encounter (HOSPITAL_COMMUNITY): Payer: Self-pay | Admitting: Nephrology

## 2024-02-22 ENCOUNTER — Encounter (HOSPITAL_COMMUNITY): Payer: Self-pay | Admitting: Nephrology

## 2024-03-05 ENCOUNTER — Ambulatory Visit: Admitting: Podiatry

## 2024-03-06 ENCOUNTER — Encounter: Payer: Self-pay | Admitting: *Deleted

## 2024-03-11 ENCOUNTER — Ambulatory Visit: Admitting: Podiatry

## 2024-03-25 ENCOUNTER — Ambulatory Visit: Admitting: Cardiovascular Disease
